# Patient Record
Sex: Female | Born: 1935
Health system: Southern US, Community
[De-identification: ages and names within clinical notes are randomized; demographics above are authoritative.]

## PROBLEM LIST (undated history)

## (undated) DIAGNOSIS — I10 Essential (primary) hypertension: Secondary | ICD-10-CM

## (undated) DIAGNOSIS — I071 Rheumatic tricuspid insufficiency: Secondary | ICD-10-CM

## (undated) DIAGNOSIS — I351 Nonrheumatic aortic (valve) insufficiency: Secondary | ICD-10-CM

## (undated) DIAGNOSIS — I77819 Aortic ectasia, unspecified site: Secondary | ICD-10-CM

## (undated) DIAGNOSIS — Z86718 Personal history of other venous thrombosis and embolism: Secondary | ICD-10-CM

## (undated) DIAGNOSIS — E785 Hyperlipidemia, unspecified: Secondary | ICD-10-CM

## (undated) DIAGNOSIS — J302 Other seasonal allergic rhinitis: Secondary | ICD-10-CM

## (undated) DIAGNOSIS — N183 Chronic kidney disease, stage 3 unspecified: Secondary | ICD-10-CM

## (undated) DIAGNOSIS — M1990 Unspecified osteoarthritis, unspecified site: Secondary | ICD-10-CM

## (undated) DIAGNOSIS — K219 Gastro-esophageal reflux disease without esophagitis: Secondary | ICD-10-CM

## (undated) DIAGNOSIS — I340 Nonrheumatic mitral (valve) insufficiency: Secondary | ICD-10-CM

## (undated) HISTORY — DX: Aortic ectasia, unspecified site: I77.819

## (undated) HISTORY — DX: Personal history of other venous thrombosis and embolism: Z86.718

## (undated) HISTORY — PX: CARPAL TUNNEL RELEASE: SHX101

## (undated) HISTORY — DX: Chronic kidney disease, stage 3 unspecified: N18.30

## (undated) HISTORY — DX: Essential (primary) hypertension: I10

## (undated) HISTORY — PX: APPENDECTOMY: SHX54

## (undated) HISTORY — DX: Nonrheumatic aortic (valve) insufficiency: I35.1

## (undated) HISTORY — PX: TONSILLECTOMY: SUR1361

## (undated) HISTORY — DX: Nonrheumatic mitral (valve) insufficiency: I34.0

## (undated) HISTORY — DX: Chronic kidney disease, stage 3 (moderate): N18.3

## (undated) HISTORY — DX: Hyperlipidemia, unspecified: E78.5

## (undated) HISTORY — DX: Rheumatic tricuspid insufficiency: I07.1

---

## 2000-10-11 ENCOUNTER — Other Ambulatory Visit: Admission: RE | Admit: 2000-10-11 | Discharge: 2000-10-11 | Payer: Self-pay | Admitting: Family Medicine

## 2000-10-12 ENCOUNTER — Ambulatory Visit (HOSPITAL_COMMUNITY): Admission: RE | Admit: 2000-10-12 | Discharge: 2000-10-12 | Payer: Self-pay | Admitting: Family Medicine

## 2000-10-12 ENCOUNTER — Encounter: Payer: Self-pay | Admitting: Family Medicine

## 2000-10-13 ENCOUNTER — Ambulatory Visit (HOSPITAL_COMMUNITY): Admission: RE | Admit: 2000-10-13 | Discharge: 2000-10-13 | Payer: Self-pay | Admitting: Family Medicine

## 2000-10-13 ENCOUNTER — Encounter: Payer: Self-pay | Admitting: Family Medicine

## 2003-10-09 ENCOUNTER — Ambulatory Visit (HOSPITAL_COMMUNITY): Admission: RE | Admit: 2003-10-09 | Discharge: 2003-10-09 | Payer: Self-pay | Admitting: Family Medicine

## 2003-10-29 ENCOUNTER — Ambulatory Visit (HOSPITAL_COMMUNITY): Admission: RE | Admit: 2003-10-29 | Discharge: 2003-10-29 | Payer: Self-pay | Admitting: Family Medicine

## 2004-02-18 ENCOUNTER — Ambulatory Visit (HOSPITAL_COMMUNITY): Admission: RE | Admit: 2004-02-18 | Discharge: 2004-02-18 | Payer: Self-pay | Admitting: Family Medicine

## 2004-07-29 ENCOUNTER — Ambulatory Visit (HOSPITAL_COMMUNITY): Admission: RE | Admit: 2004-07-29 | Discharge: 2004-07-29 | Payer: Self-pay | Admitting: Family Medicine

## 2005-08-12 ENCOUNTER — Ambulatory Visit (HOSPITAL_COMMUNITY): Admission: RE | Admit: 2005-08-12 | Discharge: 2005-08-12 | Payer: Self-pay | Admitting: Family Medicine

## 2005-10-27 ENCOUNTER — Ambulatory Visit (HOSPITAL_COMMUNITY): Admission: RE | Admit: 2005-10-27 | Discharge: 2005-10-27 | Payer: Self-pay | Admitting: Family Medicine

## 2005-12-29 ENCOUNTER — Ambulatory Visit (HOSPITAL_COMMUNITY): Admission: RE | Admit: 2005-12-29 | Discharge: 2005-12-29 | Payer: Self-pay | Admitting: Family Medicine

## 2005-12-29 ENCOUNTER — Encounter (HOSPITAL_COMMUNITY): Admission: RE | Admit: 2005-12-29 | Discharge: 2006-01-28 | Payer: Self-pay | Admitting: Orthopedic Surgery

## 2006-02-01 ENCOUNTER — Encounter: Admission: RE | Admit: 2006-02-01 | Discharge: 2006-03-03 | Payer: Self-pay | Admitting: Orthopedic Surgery

## 2006-03-04 ENCOUNTER — Ambulatory Visit: Admission: RE | Admit: 2006-03-04 | Discharge: 2006-03-04 | Payer: Self-pay | Admitting: Orthopedic Surgery

## 2006-05-03 ENCOUNTER — Ambulatory Visit (HOSPITAL_COMMUNITY): Admission: RE | Admit: 2006-05-03 | Discharge: 2006-05-03 | Payer: Self-pay | Admitting: Family Medicine

## 2006-05-18 ENCOUNTER — Encounter (HOSPITAL_COMMUNITY): Admission: RE | Admit: 2006-05-18 | Discharge: 2006-06-06 | Payer: Self-pay | Admitting: Family Medicine

## 2006-05-20 ENCOUNTER — Ambulatory Visit (HOSPITAL_COMMUNITY): Admission: RE | Admit: 2006-05-20 | Discharge: 2006-05-20 | Payer: Self-pay | Admitting: Family Medicine

## 2006-06-06 ENCOUNTER — Ambulatory Visit (HOSPITAL_COMMUNITY): Admission: RE | Admit: 2006-06-06 | Discharge: 2006-06-06 | Payer: Self-pay | Admitting: Family Medicine

## 2007-03-30 ENCOUNTER — Ambulatory Visit (HOSPITAL_COMMUNITY): Admission: RE | Admit: 2007-03-30 | Discharge: 2007-03-30 | Payer: Self-pay | Admitting: Family Medicine

## 2007-08-13 ENCOUNTER — Emergency Department (HOSPITAL_COMMUNITY): Admission: EM | Admit: 2007-08-13 | Discharge: 2007-08-13 | Payer: Self-pay | Admitting: Emergency Medicine

## 2007-08-14 ENCOUNTER — Ambulatory Visit (HOSPITAL_COMMUNITY): Admission: RE | Admit: 2007-08-14 | Discharge: 2007-08-14 | Payer: Self-pay | Admitting: Emergency Medicine

## 2007-08-14 ENCOUNTER — Emergency Department (HOSPITAL_COMMUNITY): Admission: EM | Admit: 2007-08-14 | Discharge: 2007-08-14 | Payer: Self-pay | Admitting: Emergency Medicine

## 2008-05-22 ENCOUNTER — Ambulatory Visit (HOSPITAL_COMMUNITY): Admission: RE | Admit: 2008-05-22 | Discharge: 2008-05-22 | Payer: Self-pay | Admitting: Family Medicine

## 2009-01-10 ENCOUNTER — Ambulatory Visit (HOSPITAL_COMMUNITY): Admission: RE | Admit: 2009-01-10 | Discharge: 2009-01-10 | Payer: Self-pay | Admitting: Family Medicine

## 2009-03-20 ENCOUNTER — Ambulatory Visit (HOSPITAL_COMMUNITY): Admission: RE | Admit: 2009-03-20 | Discharge: 2009-03-20 | Payer: Self-pay | Admitting: Family Medicine

## 2009-05-21 ENCOUNTER — Ambulatory Visit (HOSPITAL_COMMUNITY): Admission: RE | Admit: 2009-05-21 | Discharge: 2009-05-21 | Payer: Self-pay | Admitting: Family Medicine

## 2009-05-26 ENCOUNTER — Ambulatory Visit (HOSPITAL_COMMUNITY): Admission: RE | Admit: 2009-05-26 | Discharge: 2009-05-26 | Payer: Self-pay | Admitting: Family Medicine

## 2010-06-02 ENCOUNTER — Ambulatory Visit (HOSPITAL_COMMUNITY)
Admission: RE | Admit: 2010-06-02 | Discharge: 2010-06-02 | Payer: Self-pay | Source: Home / Self Care | Attending: Family Medicine | Admitting: Family Medicine

## 2010-09-10 ENCOUNTER — Ambulatory Visit (HOSPITAL_COMMUNITY)
Admission: RE | Admit: 2010-09-10 | Discharge: 2010-09-10 | Disposition: A | Discharge status: Home / Self Care | Payer: Medicare Other | Source: Ambulatory Visit | Attending: Family Medicine | Admitting: Family Medicine

## 2010-09-10 ENCOUNTER — Other Ambulatory Visit (HOSPITAL_COMMUNITY): Payer: Self-pay | Admitting: Family Medicine

## 2010-09-10 DIAGNOSIS — M25539 Pain in unspecified wrist: Secondary | ICD-10-CM

## 2010-09-10 DIAGNOSIS — S52539A Colles' fracture of unspecified radius, initial encounter for closed fracture: Secondary | ICD-10-CM | POA: Insufficient documentation

## 2010-09-10 DIAGNOSIS — M76899 Other specified enthesopathies of unspecified lower limb, excluding foot: Secondary | ICD-10-CM

## 2010-09-10 DIAGNOSIS — W19XXXA Unspecified fall, initial encounter: Secondary | ICD-10-CM | POA: Insufficient documentation

## 2010-09-10 DIAGNOSIS — R937 Abnormal findings on diagnostic imaging of other parts of musculoskeletal system: Secondary | ICD-10-CM | POA: Insufficient documentation

## 2010-09-10 DIAGNOSIS — M25569 Pain in unspecified knee: Secondary | ICD-10-CM | POA: Insufficient documentation

## 2010-10-26 ENCOUNTER — Ambulatory Visit (HOSPITAL_COMMUNITY)
Admission: RE | Admit: 2010-10-26 | Discharge: 2010-10-26 | Disposition: A | Discharge status: Home / Self Care | Payer: Medicare Other | Source: Ambulatory Visit | Attending: Orthopaedic Surgery | Admitting: Orthopaedic Surgery

## 2010-10-26 ENCOUNTER — Ambulatory Visit (HOSPITAL_COMMUNITY)
Admission: RE | Admit: 2010-10-26 | Discharge: 2010-10-26 | Disposition: A | Discharge status: Home / Self Care | Payer: Medicare Other | Source: Ambulatory Visit | Attending: Physical Therapy | Admitting: Physical Therapy

## 2010-10-26 DIAGNOSIS — M25439 Effusion, unspecified wrist: Secondary | ICD-10-CM | POA: Insufficient documentation

## 2010-10-26 DIAGNOSIS — M6281 Muscle weakness (generalized): Secondary | ICD-10-CM | POA: Insufficient documentation

## 2010-10-26 DIAGNOSIS — Z5189 Encounter for other specified aftercare: Secondary | ICD-10-CM | POA: Insufficient documentation

## 2010-10-26 DIAGNOSIS — I10 Essential (primary) hypertension: Secondary | ICD-10-CM | POA: Insufficient documentation

## 2010-10-26 DIAGNOSIS — M25539 Pain in unspecified wrist: Secondary | ICD-10-CM | POA: Insufficient documentation

## 2010-10-26 DIAGNOSIS — M25639 Stiffness of unspecified wrist, not elsewhere classified: Secondary | ICD-10-CM | POA: Insufficient documentation

## 2010-11-04 ENCOUNTER — Ambulatory Visit (HOSPITAL_COMMUNITY)
Admission: RE | Admit: 2010-11-04 | Discharge: 2010-11-04 | Disposition: A | Discharge status: Home / Self Care | Payer: Medicare Other | Source: Ambulatory Visit | Attending: Family Medicine | Admitting: Family Medicine

## 2011-05-28 ENCOUNTER — Other Ambulatory Visit (HOSPITAL_COMMUNITY): Payer: Self-pay | Admitting: Family Medicine

## 2011-05-28 DIAGNOSIS — Z139 Encounter for screening, unspecified: Secondary | ICD-10-CM

## 2011-05-28 DIAGNOSIS — Z01419 Encounter for gynecological examination (general) (routine) without abnormal findings: Secondary | ICD-10-CM

## 2011-06-02 ENCOUNTER — Ambulatory Visit (HOSPITAL_COMMUNITY)
Admission: RE | Admit: 2011-06-02 | Discharge: 2011-06-02 | Disposition: A | Discharge status: Home / Self Care | Payer: Medicare Other | Source: Ambulatory Visit | Attending: Family Medicine | Admitting: Family Medicine

## 2011-06-02 DIAGNOSIS — Z139 Encounter for screening, unspecified: Secondary | ICD-10-CM

## 2011-06-02 DIAGNOSIS — M949 Disorder of cartilage, unspecified: Secondary | ICD-10-CM | POA: Insufficient documentation

## 2011-06-02 DIAGNOSIS — Z780 Asymptomatic menopausal state: Secondary | ICD-10-CM | POA: Insufficient documentation

## 2011-06-02 DIAGNOSIS — Z01419 Encounter for gynecological examination (general) (routine) without abnormal findings: Secondary | ICD-10-CM

## 2011-06-02 DIAGNOSIS — M899 Disorder of bone, unspecified: Secondary | ICD-10-CM | POA: Insufficient documentation

## 2011-06-04 ENCOUNTER — Ambulatory Visit (HOSPITAL_COMMUNITY)
Admission: RE | Admit: 2011-06-04 | Discharge: 2011-06-04 | Disposition: A | Discharge status: Home / Self Care | Payer: Medicare Other | Source: Ambulatory Visit | Attending: Family Medicine | Admitting: Family Medicine

## 2011-06-04 DIAGNOSIS — Z1231 Encounter for screening mammogram for malignant neoplasm of breast: Secondary | ICD-10-CM | POA: Insufficient documentation

## 2011-06-04 DIAGNOSIS — Z139 Encounter for screening, unspecified: Secondary | ICD-10-CM

## 2012-05-11 ENCOUNTER — Other Ambulatory Visit (HOSPITAL_COMMUNITY): Payer: Self-pay | Admitting: Family Medicine

## 2012-05-11 DIAGNOSIS — Z139 Encounter for screening, unspecified: Secondary | ICD-10-CM

## 2012-06-05 ENCOUNTER — Ambulatory Visit (HOSPITAL_COMMUNITY)
Admission: RE | Admit: 2012-06-05 | Discharge: 2012-06-05 | Disposition: A | Discharge status: Home / Self Care | Payer: Medicare Other | Source: Ambulatory Visit | Attending: Family Medicine | Admitting: Family Medicine

## 2012-06-05 DIAGNOSIS — Z139 Encounter for screening, unspecified: Secondary | ICD-10-CM

## 2012-06-05 DIAGNOSIS — Z1231 Encounter for screening mammogram for malignant neoplasm of breast: Secondary | ICD-10-CM | POA: Insufficient documentation

## 2012-07-24 ENCOUNTER — Ambulatory Visit (HOSPITAL_COMMUNITY)
Admission: RE | Admit: 2012-07-24 | Discharge: 2012-07-24 | Disposition: A | Discharge status: Home / Self Care | Payer: Medicare Other | Source: Ambulatory Visit | Attending: Family Medicine | Admitting: Family Medicine

## 2012-07-24 ENCOUNTER — Other Ambulatory Visit (HOSPITAL_COMMUNITY): Payer: Self-pay | Admitting: Family Medicine

## 2012-07-24 DIAGNOSIS — I10 Essential (primary) hypertension: Secondary | ICD-10-CM | POA: Insufficient documentation

## 2012-07-24 DIAGNOSIS — J309 Allergic rhinitis, unspecified: Secondary | ICD-10-CM

## 2012-07-24 DIAGNOSIS — R059 Cough, unspecified: Secondary | ICD-10-CM | POA: Insufficient documentation

## 2012-07-24 DIAGNOSIS — R05 Cough: Secondary | ICD-10-CM | POA: Insufficient documentation

## 2012-07-24 DIAGNOSIS — J328 Other chronic sinusitis: Secondary | ICD-10-CM

## 2012-08-10 ENCOUNTER — Ambulatory Visit (INDEPENDENT_AMBULATORY_CARE_PROVIDER_SITE_OTHER): Payer: Medicare Other | Admitting: Otolaryngology

## 2012-09-14 ENCOUNTER — Ambulatory Visit (INDEPENDENT_AMBULATORY_CARE_PROVIDER_SITE_OTHER): Payer: Medicare Other | Admitting: Otolaryngology

## 2012-09-14 DIAGNOSIS — K219 Gastro-esophageal reflux disease without esophagitis: Secondary | ICD-10-CM

## 2012-09-14 DIAGNOSIS — R05 Cough: Secondary | ICD-10-CM

## 2012-09-14 DIAGNOSIS — R0982 Postnasal drip: Secondary | ICD-10-CM

## 2013-02-02 ENCOUNTER — Other Ambulatory Visit (HOSPITAL_COMMUNITY): Payer: Self-pay | Admitting: Nephrology

## 2013-02-07 ENCOUNTER — Ambulatory Visit (HOSPITAL_COMMUNITY)
Admission: RE | Admit: 2013-02-07 | Discharge: 2013-02-07 | Disposition: A | Discharge status: Home / Self Care | Payer: Medicare Other | Source: Ambulatory Visit | Attending: Nephrology | Admitting: Nephrology

## 2013-02-07 DIAGNOSIS — N189 Chronic kidney disease, unspecified: Secondary | ICD-10-CM | POA: Insufficient documentation

## 2013-06-04 ENCOUNTER — Other Ambulatory Visit (HOSPITAL_COMMUNITY): Payer: Self-pay | Admitting: Family Medicine

## 2013-06-04 DIAGNOSIS — Z139 Encounter for screening, unspecified: Secondary | ICD-10-CM

## 2013-06-05 ENCOUNTER — Ambulatory Visit (HOSPITAL_COMMUNITY)
Admission: RE | Admit: 2013-06-05 | Discharge: 2013-06-05 | Disposition: A | Discharge status: Home / Self Care | Payer: Medicare Other | Source: Ambulatory Visit | Attending: Family Medicine | Admitting: Family Medicine

## 2013-06-05 DIAGNOSIS — M810 Age-related osteoporosis without current pathological fracture: Secondary | ICD-10-CM | POA: Insufficient documentation

## 2013-06-05 DIAGNOSIS — Z139 Encounter for screening, unspecified: Secondary | ICD-10-CM

## 2013-06-11 ENCOUNTER — Ambulatory Visit (HOSPITAL_COMMUNITY)
Admission: RE | Admit: 2013-06-11 | Discharge: 2013-06-11 | Disposition: A | Discharge status: Home / Self Care | Payer: Medicare Other | Source: Ambulatory Visit | Attending: Family Medicine | Admitting: Family Medicine

## 2013-06-11 DIAGNOSIS — Z1231 Encounter for screening mammogram for malignant neoplasm of breast: Secondary | ICD-10-CM | POA: Insufficient documentation

## 2013-06-11 DIAGNOSIS — Z139 Encounter for screening, unspecified: Secondary | ICD-10-CM

## 2014-06-12 ENCOUNTER — Other Ambulatory Visit (HOSPITAL_COMMUNITY): Payer: Self-pay | Admitting: Family Medicine

## 2014-06-12 DIAGNOSIS — Z1231 Encounter for screening mammogram for malignant neoplasm of breast: Secondary | ICD-10-CM

## 2014-06-17 ENCOUNTER — Ambulatory Visit (HOSPITAL_COMMUNITY)
Admission: RE | Admit: 2014-06-17 | Discharge: 2014-06-17 | Disposition: A | Discharge status: Home / Self Care | Payer: Medicare Other | Source: Ambulatory Visit | Attending: Family Medicine | Admitting: Family Medicine

## 2014-06-17 DIAGNOSIS — Z1231 Encounter for screening mammogram for malignant neoplasm of breast: Secondary | ICD-10-CM | POA: Diagnosis present

## 2014-06-21 ENCOUNTER — Other Ambulatory Visit (HOSPITAL_COMMUNITY): Payer: Self-pay | Admitting: Family Medicine

## 2014-06-21 DIAGNOSIS — R011 Cardiac murmur, unspecified: Secondary | ICD-10-CM

## 2014-06-21 DIAGNOSIS — Z8249 Family history of ischemic heart disease and other diseases of the circulatory system: Secondary | ICD-10-CM

## 2014-06-24 ENCOUNTER — Other Ambulatory Visit (HOSPITAL_COMMUNITY): Payer: Self-pay | Admitting: Family Medicine

## 2014-06-24 ENCOUNTER — Ambulatory Visit (HOSPITAL_COMMUNITY)
Admission: RE | Admit: 2014-06-24 | Discharge: 2014-06-24 | Disposition: A | Discharge status: Home / Self Care | Payer: Medicare Other | Source: Ambulatory Visit | Attending: Family Medicine | Admitting: Family Medicine

## 2014-06-24 DIAGNOSIS — Z8249 Family history of ischemic heart disease and other diseases of the circulatory system: Secondary | ICD-10-CM

## 2014-06-24 DIAGNOSIS — R011 Cardiac murmur, unspecified: Secondary | ICD-10-CM

## 2014-06-24 DIAGNOSIS — Z1389 Encounter for screening for other disorder: Secondary | ICD-10-CM | POA: Insufficient documentation

## 2014-06-24 DIAGNOSIS — I77811 Abdominal aortic ectasia: Secondary | ICD-10-CM | POA: Diagnosis not present

## 2014-07-31 ENCOUNTER — Encounter: Payer: Self-pay | Admitting: *Deleted

## 2014-08-01 ENCOUNTER — Encounter: Payer: Self-pay | Admitting: Cardiology

## 2014-08-01 ENCOUNTER — Ambulatory Visit (INDEPENDENT_AMBULATORY_CARE_PROVIDER_SITE_OTHER): Payer: Medicare Other | Admitting: Cardiology

## 2014-08-01 VITALS — BP 168/90 | HR 69 | Ht 66.0 in | Wt 135.0 lb

## 2014-08-01 DIAGNOSIS — E782 Mixed hyperlipidemia: Secondary | ICD-10-CM

## 2014-08-01 DIAGNOSIS — R011 Cardiac murmur, unspecified: Secondary | ICD-10-CM

## 2014-08-01 DIAGNOSIS — I10 Essential (primary) hypertension: Secondary | ICD-10-CM | POA: Diagnosis not present

## 2014-08-01 DIAGNOSIS — I77811 Abdominal aortic ectasia: Secondary | ICD-10-CM

## 2014-08-01 NOTE — Patient Instructions (Signed)

## 2014-08-01 NOTE — Progress Notes (Signed)
Cardiology Office Note  Date: 08/01/2014   ID: Allison Morris, DOB 06-25-1935, MRN [id number]  PCP: Allison Morel, MD  Primary Cardiologist: Allison Lesches, MD   Chief Complaint  Patient presents with  . Heart Murmur    History of Present Illness: Allison Morris is a 79 y.o. female referred for cardiology consultation by Dr. Ethlyn Morris. She reports a long-standing history of cardiac murmur since childhood, no standing diagnosis of valvular heart disease however. She does not recall ever undergoing an echocardiogram. She has become more concerned about this as her sister just recently had to undergo what sounds like surgical repair of her aortic valve and a thoracic aortic aneurysm at [facility].  She describes herself as being fairly active with typical ADLs, gets mildly short of breath with activities such as vacuuming, but no progressive symptoms. No chest pain with exertion. She states that she has been told in the past that she has an "irregular heartbeat," but is not aware of any palpitations or diagnosis of cardiac arrhythmia.  She did have a recent abdominal ultrasound which reported ectasia of the abdominal aorta at 2.6 x 2.3 cm, no definite aneurysm however.   Past Medical History  Diagnosis Date  . Essential hypertension   . Hyperlipemia   . History of DVT (deep vein thrombosis)     Left leg 1997     Past Surgical History  Procedure Laterality Date  . Appendectomy    . Carpal tunnel release    . Tonsillectomy      Current Outpatient Prescriptions  Medication Sig Dispense Refill  . albuterol (PROVENTIL) 2 MG tablet Take 2 mg by mouth 2 (two) times daily.   0  . aspirin EC 325 MG tablet Take 325 mg by mouth daily.    Marland Kitchen CALCIUM-VITAMIN D PO Take 400 mg by mouth daily.    . clobetasol cream (TEMOVATE) 4.66 % Apply 1 application topically 2 (two) times daily.    . fexofenadine (ALLEGRA) 180 MG tablet Take 180 mg by mouth daily.    . fluticasone  (FLONASE) 50 MCG/ACT nasal spray Place into both nostrils daily.    Marland Kitchen KRILL OIL PO Take 600 mg by mouth daily.    . nebivolol (BYSTOLIC) 10 MG tablet Take 10 mg by mouth daily.    . pantoprazole (PROTONIX) 40 MG tablet Take 40 mg by mouth 2 (two) times daily.     No current facility-administered medications for this visit.    Allergies:  Biaxin; Doxycycline; Estrogens; Macrodantin; Statins; Ceftin; Ciprofloxacin; and Sulfa antibiotics   Social History: The patient  reports that she has never smoked. She has never used smokeless tobacco. She reports that she does not drink alcohol or use illicit drugs.   Family History: The patient's family history includes Aneurysm in her paternal grandfather and sister; Heart attack in her paternal grandfather; Valvular heart disease in her sister.   ROS:  Please see the history of present illness. Otherwise, complete review of systems is positive for none.  All other systems are reviewed and negative.    Physical Exam: VS:  BP 168/90 mmHg  Pulse 69  Ht 5\' 6"  (1.676 m)  Wt 135 lb (61.236 kg)  BMI 21.80 kg/m2  SpO2 98%, BMI Body mass index is 21.8 kg/(m^2).  Wt Readings from Last 3 Encounters:  08/01/14 135 lb (61.236 kg)     General: Patient appears comfortable at rest. HEENT: Conjunctiva and lids normal, oropharynx clear with moist mucosa.  Neck: Supple, no elevated JVP or carotid bruits, no thyromegaly. Lungs: Clear to auscultation, nonlabored breathing at rest. Cardiac: Regular rate and rhythm, no S3, soft right basal systolic murmur, no pericardial rub. Abdomen: Soft, nontender, bowel sounds present, no guarding or rebound. Extremities: No pitting edema, distal pulses 2+. Skin: Warm and dry. Musculoskeletal: No kyphosis. Neuropsychiatric: Alert and oriented x3, affect grossly appropriate.   ECG: ECG is not ordered today.   Recent Labwork:  Recent lab work from January showed BUN 33, creatinine 1.3, potassium 5.1, AST 18, ALT 10,  hemoglobin 15.3, platelets 2:15, cholesterol 255, triglycerides 129, HDL 62, LDL 167.  Other Studies Reviewed Today:  Abdominal ultrasound 06/24/2014: EXAM: ABDOMINAL AORTA SCREENING ULTRASOUND  TECHNIQUE: Ultrasound examination of the abdominal aorta was performed as a screening evaluation for abdominal aortic aneurysm.  COMPARISON: None.  FINDINGS: Abdominal Aorta  No aneurysm identified.  Maximum AP  Diameter: 2.6 cm  Maximum TRV  Diameter: 2.3 cm  IMPRESSION: 1. Ectatic abdominal aorta at risk for aneurysm development. Recommend follow up by [place] in 5 years. This recommendation follows ACR consensus guidelines: White Paper of the ACR Incidental Findings Committee II on Vascular Findings. J Am Coll Radiol 2013; 10:789-794.  Assessment and Plan:  1. Reportedly long-standing systolic murmur, soft on examination, seems unlikely to be pathologic at this point, and not clearly symptomatic. She has not had an echocardiogram for objective evaluation, and this will be obtained. This will also give [place] a review of her ascending thoracic aorta and arch.  2. Hyperlipidemia, recent LDL 167. She is currently not on specific medical therapy other than Krill oil.  3. Essential hypertension, presently on Bystolic. Blood pressure is elevated today. Keep follow-up with primary care as further medication adjustments may be necessary.  4. Ectatic abdominal aorta without definitive aneurysm as detailed above.  Current medicines are reviewed at length with the patient today.  The patient does not have concerns regarding medicines.    Orders Placed This Encounter  Procedures  . 2D Echocardiogram without contrast    Disposition: Call with results.   Signed, Allison Sark, MD, [facility] 08/01/2014 3:47 PM    [facility] [place], [place], [place] [address] Phone: [phone]; Fax: [phone]

## 2014-08-02 ENCOUNTER — Encounter: Payer: Self-pay | Admitting: Cardiology

## 2014-08-05 ENCOUNTER — Other Ambulatory Visit: Payer: Self-pay | Admitting: Radiology

## 2014-08-05 DIAGNOSIS — R011 Cardiac murmur, unspecified: Secondary | ICD-10-CM

## 2014-08-07 ENCOUNTER — Other Ambulatory Visit (INDEPENDENT_AMBULATORY_CARE_PROVIDER_SITE_OTHER): Payer: Medicare Other

## 2014-08-07 ENCOUNTER — Other Ambulatory Visit: Payer: Self-pay

## 2014-08-07 DIAGNOSIS — I77811 Abdominal aortic ectasia: Secondary | ICD-10-CM

## 2014-08-07 DIAGNOSIS — R011 Cardiac murmur, unspecified: Secondary | ICD-10-CM | POA: Diagnosis not present

## 2014-08-08 ENCOUNTER — Other Ambulatory Visit: Payer: Medicare Other

## 2014-08-09 ENCOUNTER — Telehealth: Payer: Self-pay | Admitting: *Deleted

## 2014-08-09 NOTE — Telephone Encounter (Signed)
Patient informed. 

## 2014-08-09 NOTE — Telephone Encounter (Signed)
-----   Message from Satira Sark, MD sent at 08/08/2014 11:25 AM EST ----- Reviewed report. LV systolic function is normal. The aortic valve is mildly thickened with mild aortic regurgitation, but no pathologic aortic stenosis. Ascending aorta is mildly dilated. This should likely be re-imaged in a year just to ensure stability over time. Not clear that it is symptomatic however. She should keep follow-up with her primary care provider, we can see her back in one year.

## 2014-09-10 ENCOUNTER — Emergency Department (HOSPITAL_COMMUNITY)
Admission: EM | Admit: 2014-09-10 | Discharge: 2014-09-10 | Disposition: A | Discharge status: Home / Self Care | Payer: Medicare Other | Attending: Emergency Medicine | Admitting: Emergency Medicine

## 2014-09-10 ENCOUNTER — Encounter (HOSPITAL_COMMUNITY): Payer: Self-pay | Admitting: *Deleted

## 2014-09-10 DIAGNOSIS — Z86718 Personal history of other venous thrombosis and embolism: Secondary | ICD-10-CM | POA: Insufficient documentation

## 2014-09-10 DIAGNOSIS — Z7982 Long term (current) use of aspirin: Secondary | ICD-10-CM | POA: Insufficient documentation

## 2014-09-10 DIAGNOSIS — I10 Essential (primary) hypertension: Secondary | ICD-10-CM | POA: Diagnosis present

## 2014-09-10 DIAGNOSIS — I160 Hypertensive urgency: Secondary | ICD-10-CM

## 2014-09-10 DIAGNOSIS — Z79899 Other long term (current) drug therapy: Secondary | ICD-10-CM | POA: Diagnosis not present

## 2014-09-10 DIAGNOSIS — Z8639 Personal history of other endocrine, nutritional and metabolic disease: Secondary | ICD-10-CM | POA: Diagnosis not present

## 2014-09-10 LAB — CBC WITH DIFFERENTIAL/PLATELET
Basophils Absolute: 0.1 10*3/uL (ref 0.0–0.1)
Basophils Relative: 1 % (ref 0–1)
EOS ABS: 0.1 10*3/uL (ref 0.0–0.7)
Eosinophils Relative: 2 % (ref 0–5)
HCT: 41.8 % (ref 36.0–46.0)
HEMOGLOBIN: 14.4 g/dL (ref 12.0–15.0)
LYMPHS PCT: 22 % (ref 12–46)
Lymphs Abs: 1.3 10*3/uL (ref 0.7–4.0)
MCH: 34.1 pg — AB (ref 26.0–34.0)
MCHC: 34.4 g/dL (ref 30.0–36.0)
MCV: 99.1 fL (ref 78.0–100.0)
MONOS PCT: 8 % (ref 3–12)
Monocytes Absolute: 0.5 10*3/uL (ref 0.1–1.0)
Neutro Abs: 4.1 10*3/uL (ref 1.7–7.7)
Neutrophils Relative %: 67 % (ref 43–77)
Platelets: 160 10*3/uL (ref 150–400)
RBC: 4.22 MIL/uL (ref 3.87–5.11)
RDW: 11.8 % (ref 11.5–15.5)
WBC: 6.1 10*3/uL (ref 4.0–10.5)

## 2014-09-10 LAB — COMPREHENSIVE METABOLIC PANEL
ALT: 12 U/L (ref 0–35)
AST: 15 U/L (ref 0–37)
Albumin: 4.1 g/dL (ref 3.5–5.2)
Alkaline Phosphatase: 80 U/L (ref 39–117)
Anion gap: 7 (ref 5–15)
BUN: 27 mg/dL — ABNORMAL HIGH (ref 6–23)
CO2: 23 mmol/L (ref 19–32)
Calcium: 10 mg/dL (ref 8.4–10.5)
Chloride: 109 mmol/L (ref 96–112)
Creatinine, Ser: 1.36 mg/dL — ABNORMAL HIGH (ref 0.50–1.10)
GFR calc Af Amer: 43 mL/min — ABNORMAL LOW (ref >90)
GFR, EST NON AFRICAN AMERICAN: 37 mL/min — AB (ref >90)
GLUCOSE: 104 mg/dL — AB (ref 70–99)
Potassium: 5.3 mmol/L — ABNORMAL HIGH (ref 3.5–5.1)
Sodium: 139 mmol/L (ref 135–145)
Total Bilirubin: 0.9 mg/dL (ref 0.3–1.2)
Total Protein: 7.1 g/dL (ref 6.0–8.3)

## 2014-09-10 LAB — TROPONIN I: Troponin I: <0.03 ng/mL (ref <0.031)

## 2014-09-10 MED ORDER — CLONIDINE HCL 0.2 MG PO TABS
0.2000 mg | ORAL_TABLET | Freq: Once | ORAL | Status: AC
  Administered 2014-09-10: 0.2 mg via ORAL
  Filled 2014-09-10: qty 1

## 2014-09-10 NOTE — Discharge Instructions (Signed)
Continue your antihypertensive medications as before.  Keep a record of your blood pressures so that you can take this to your primary Dr. at your next appointment and determine whether or not changes need to be made.   Hypertension Hypertension, commonly called high blood pressure, is when the force of blood pumping through your arteries is too strong. Your arteries are the blood vessels that carry blood from your heart throughout your body. A blood pressure reading consists of a higher number over a lower number, such as 110/72. The higher number (systolic) is the pressure inside your arteries when your heart pumps. The lower number (diastolic) is the pressure inside your arteries when your heart relaxes. Ideally you want your blood pressure below 120/80. Hypertension forces your heart to work harder to pump blood. Your arteries may become narrow or stiff. Having hypertension puts you at risk for heart disease, stroke, and other problems.  RISK FACTORS Some risk factors for high blood pressure are controllable. Others are not.  Risk factors you cannot control include:   Race. You may be at higher risk if you are African American.  Age. Risk increases with age.  Gender. Men are at higher risk than women before age 52 years. After age 26, women are at higher risk than men. Risk factors you can control include:  Not getting enough exercise or physical activity.  Being overweight.  Getting too much fat, sugar, calories, or salt in your diet.  Drinking too much alcohol. SIGNS AND SYMPTOMS Hypertension does not usually cause signs or symptoms. Extremely high blood pressure (hypertensive crisis) may cause headache, anxiety, shortness of breath, and nosebleed. DIAGNOSIS  To check if you have hypertension, your health care provider will measure your blood pressure while you are seated, with your arm held at the level of your heart. It should be measured at least twice using the same arm.  Certain conditions can cause a difference in blood pressure between your right and left arms. A blood pressure reading that is higher than normal on one occasion does not mean that you need treatment. If one blood pressure reading is high, ask your health care provider about having it checked again. TREATMENT  Treating high blood pressure includes making lifestyle changes and possibly taking medicine. Living a healthy lifestyle can help lower high blood pressure. You may need to change some of your habits. Lifestyle changes may include:  Following the DASH diet. This diet is high in fruits, vegetables, and whole grains. It is low in salt, red meat, and added sugars.  Getting at least 2 hours of brisk physical activity every week.  Losing weight if necessary.  Not smoking.  Limiting alcoholic beverages.  Learning ways to reduce stress. If lifestyle changes are not enough to get your blood pressure under control, your health care provider may prescribe medicine. You may need to take more than one. Work closely with your health care provider to understand the risks and benefits. HOME CARE INSTRUCTIONS  Have your blood pressure rechecked as directed by your health care provider.   Take medicines only as directed by your health care provider. Follow the directions carefully. Blood pressure medicines must be taken as prescribed. The medicine does not work as well when you skip doses. Skipping doses also puts you at risk for problems.   Do not smoke.   Monitor your blood pressure at home as directed by your health care provider. SEEK MEDICAL CARE IF:   You think you are having  a reaction to medicines taken.  You have recurrent headaches or feel dizzy.  You have swelling in your ankles.  You have trouble with your vision. SEEK IMMEDIATE MEDICAL CARE IF:  You develop a severe headache or confusion.  You have unusual weakness, numbness, or feel faint.  You have severe chest or  abdominal pain.  You vomit repeatedly.  You have trouble breathing. MAKE SURE YOU:   Understand these instructions.  Will watch your condition.  Will get help right away if you are not doing well or get worse. Document Released: 05/24/2005 Document Revised: 10/08/2013 Document Reviewed: 03/16/2013 [facility] Patient Information 2015 [place], [place]. This information is not intended to replace advice given to you by your health care provider. Make sure you discuss any questions you have with your health care provider.

## 2014-09-10 NOTE — ED Notes (Signed)
Pt reports seeing Dr Gerarda Fraction the other day and was given medications for HTN.  Pt states that she checked her BP tonight and it was 209/99.  Pt c/o rash on her face as well, and was treated with prednisone.

## 2014-09-10 NOTE — ED Provider Notes (Signed)
CSN: [id number]     Arrival date & time 09/10/14  6967 History   First MD Initiated Contact with Patient 09/10/14 [phone]     Chief Complaint  Patient presents with  . Hypertension     (Consider location/radiation/quality/duration/timing/severity/associated sxs/prior Treatment) HPI Comments: Patient is a 79 year old female with history of hypertension, high cholesterol, and DVT. She presents today for evaluation of elevated blood pressure. She recently saw her primary Dr. for a rash on her face. She was given a steroid injection and a referral was made to dermatology. She is awaiting this appointment. This evening she wasn't feeling well, then took her blood pressure was found to be 208. She presents for evaluation of this. She denies any headache or chest pain. She denies any shortness of breath.  Patient is a 79 y.o. female presenting with hypertension. The history is provided by the patient.  Hypertension This is a new problem. The current episode started yesterday. The problem occurs constantly. Pertinent negatives include no chest pain, no abdominal pain, no headaches and no shortness of breath. The symptoms are aggravated by intercourse. She has tried nothing for the symptoms. The treatment provided no relief.    Past Medical History  Diagnosis Date  . Essential hypertension   . Hyperlipemia   . History of DVT (deep vein thrombosis)     Left leg 1997    Past Surgical History  Procedure Laterality Date  . Appendectomy    . Carpal tunnel release    . Tonsillectomy     Family History  Problem Relation Age of Onset  . Aneurysm Sister   . Valvular heart disease Sister   . Aneurysm Paternal Grandfather   . Heart attack Paternal Grandfather    History  Substance Use Topics  . Smoking status: Never Smoker   . Smokeless tobacco: Never Used  . Alcohol Use: No   OB History    No data available     Review of Systems  Respiratory: Negative for shortness of breath.    Cardiovascular: Negative for chest pain.  Gastrointestinal: Negative for abdominal pain.  Neurological: Negative for headaches.  All other systems reviewed and are negative.     Allergies  Biaxin; Doxycycline; Estrogens; Macrodantin; Statins; Ceftin; Ciprofloxacin; and Sulfa antibiotics  Home Medications   Prior to Admission medications   Medication Sig Start Date End Date Taking? Authorizing Provider  losartan (COZAAR) 25 MG tablet Take 25 mg by mouth daily.   Yes Historical Provider, MD  albuterol (PROVENTIL) 2 MG tablet Take 2 mg by mouth 2 (two) times daily.  05/21/14   Historical Provider, MD  aspirin EC 325 MG tablet Take 325 mg by mouth daily.    Historical Provider, MD  CALCIUM-VITAMIN D PO Take 400 mg by mouth daily.    Historical Provider, MD  clobetasol cream (TEMOVATE) 1.01 % Apply 1 application topically 2 (two) times daily.    Historical Provider, MD  fexofenadine (ALLEGRA) 180 MG tablet Take 180 mg by mouth daily.    Historical Provider, MD  fluticasone (FLONASE) 50 MCG/ACT nasal spray Place into both nostrils daily.    Historical Provider, MD  KRILL OIL PO Take 600 mg by mouth daily.    Historical Provider, MD  nebivolol (BYSTOLIC) 10 MG tablet Take 10 mg by mouth daily.    Historical Provider, MD  pantoprazole (PROTONIX) 40 MG tablet Take 40 mg by mouth 2 (two) times daily.    Historical Provider, MD   BP 206/96 mmHg  Pulse  67  Temp(Src) 98.3 F (36.8 C) (Oral)  Resp 18  Ht 5\' 7"  (1.702 m)  Wt 130 lb (58.968 kg)  BMI 20.36 kg/m2  SpO2 99% Physical Exam  Constitutional: She is oriented to person, place, and time. She appears well-developed and well-nourished. No distress.  HENT:  Head: Normocephalic and atraumatic.  Mouth/Throat: Oropharynx is clear and moist.  Eyes: EOM are normal. Pupils are equal, round, and reactive to light.  Neck: Normal range of motion. Neck supple.  Cardiovascular: Normal rate and regular rhythm.  Exam reveals no gallop and no  friction rub.   No murmur heard. Pulmonary/Chest: Effort normal and breath sounds normal. No respiratory distress. She has no wheezes.  Abdominal: Soft. Bowel sounds are normal. She exhibits no distension. There is no tenderness.  Musculoskeletal: Normal range of motion. She exhibits no edema.  Neurological: She is alert and oriented to person, place, and time. No cranial nerve deficit. She exhibits normal muscle tone. Coordination normal.  Skin: Skin is warm and dry. She is not diaphoretic.  Nursing note and vitals reviewed.   ED Course  Procedures (including critical care time) Labs Review Labs Reviewed  COMPREHENSIVE METABOLIC PANEL  CBC WITH DIFFERENTIAL/PLATELET  TROPONIN I    Imaging Review No results found.   EKG Interpretation   Date/Time:  Tuesday September 10 2014 04:21:04 EDT Ventricular Rate:  60 PR Interval:  194 QRS Duration: 99 QT Interval:  458 QTC Calculation: 458 R Axis:   68 Text Interpretation:  Sinus rhythm Confirmed by DELOS  MD, Zyquan Crotty ([id number])  on 09/10/2014 5:07:21 AM      MDM   Final diagnoses:  None    Patient presents here with complaints of elevated blood pressure and not feeling well. Her workup reveals no evidence for endorgan damage in her blood pressure has improved with clonidine. She will be discharged to home. I will advise her to continue her anti-hypertensive medications as before and keep a record of her pressures. She can present this to her primary doctor the next time she follows up to determine if changes need to be made. Her neurologic exam is nonfocal and I do not feel as though any further workup is indicated at this point.    Veryl Speak, MD 09/10/14 [phone]

## 2015-06-11 ENCOUNTER — Other Ambulatory Visit: Payer: Self-pay | Admitting: Cardiology

## 2015-06-11 DIAGNOSIS — R011 Cardiac murmur, unspecified: Secondary | ICD-10-CM

## 2015-06-12 DIAGNOSIS — H6690 Otitis media, unspecified, unspecified ear: Secondary | ICD-10-CM | POA: Diagnosis not present

## 2015-06-12 DIAGNOSIS — Z1389 Encounter for screening for other disorder: Secondary | ICD-10-CM | POA: Diagnosis not present

## 2015-06-12 DIAGNOSIS — Z6823 Body mass index (BMI) 23.0-23.9, adult: Secondary | ICD-10-CM | POA: Diagnosis not present

## 2015-06-12 DIAGNOSIS — H6990 Unspecified Eustachian tube disorder, unspecified ear: Secondary | ICD-10-CM | POA: Diagnosis not present

## 2015-06-17 DIAGNOSIS — Z1389 Encounter for screening for other disorder: Secondary | ICD-10-CM | POA: Diagnosis not present

## 2015-06-17 DIAGNOSIS — I10 Essential (primary) hypertension: Secondary | ICD-10-CM | POA: Diagnosis not present

## 2015-06-17 DIAGNOSIS — Z0000 Encounter for general adult medical examination without abnormal findings: Secondary | ICD-10-CM | POA: Diagnosis not present

## 2015-06-17 DIAGNOSIS — N183 Chronic kidney disease, stage 3 (moderate): Secondary | ICD-10-CM | POA: Diagnosis not present

## 2015-06-17 DIAGNOSIS — Z6823 Body mass index (BMI) 23.0-23.9, adult: Secondary | ICD-10-CM | POA: Diagnosis not present

## 2015-06-18 ENCOUNTER — Other Ambulatory Visit (HOSPITAL_COMMUNITY): Payer: Self-pay | Admitting: Internal Medicine

## 2015-06-18 DIAGNOSIS — Z1231 Encounter for screening mammogram for malignant neoplasm of breast: Secondary | ICD-10-CM

## 2015-06-18 DIAGNOSIS — Z13820 Encounter for screening for osteoporosis: Secondary | ICD-10-CM

## 2015-06-23 ENCOUNTER — Ambulatory Visit (HOSPITAL_COMMUNITY)
Admission: RE | Admit: 2015-06-23 | Discharge: 2015-06-23 | Disposition: A | Discharge status: Home / Self Care | Payer: PPO | Source: Ambulatory Visit | Attending: Internal Medicine | Admitting: Internal Medicine

## 2015-06-23 DIAGNOSIS — Z1231 Encounter for screening mammogram for malignant neoplasm of breast: Secondary | ICD-10-CM | POA: Insufficient documentation

## 2015-06-23 DIAGNOSIS — Z13820 Encounter for screening for osteoporosis: Secondary | ICD-10-CM | POA: Diagnosis not present

## 2015-06-23 DIAGNOSIS — Z780 Asymptomatic menopausal state: Secondary | ICD-10-CM | POA: Insufficient documentation

## 2015-06-23 DIAGNOSIS — R928 Other abnormal and inconclusive findings on diagnostic imaging of breast: Secondary | ICD-10-CM | POA: Diagnosis not present

## 2015-06-23 DIAGNOSIS — M810 Age-related osteoporosis without current pathological fracture: Secondary | ICD-10-CM | POA: Insufficient documentation

## 2015-06-24 ENCOUNTER — Other Ambulatory Visit: Payer: Self-pay | Admitting: Internal Medicine

## 2015-06-24 DIAGNOSIS — R928 Other abnormal and inconclusive findings on diagnostic imaging of breast: Secondary | ICD-10-CM

## 2015-06-27 DIAGNOSIS — H04123 Dry eye syndrome of bilateral lacrimal glands: Secondary | ICD-10-CM | POA: Diagnosis not present

## 2015-06-27 DIAGNOSIS — H10413 Chronic giant papillary conjunctivitis, bilateral: Secondary | ICD-10-CM | POA: Diagnosis not present

## 2015-06-27 DIAGNOSIS — Z961 Presence of intraocular lens: Secondary | ICD-10-CM | POA: Diagnosis not present

## 2015-06-27 DIAGNOSIS — H43813 Vitreous degeneration, bilateral: Secondary | ICD-10-CM | POA: Diagnosis not present

## 2015-06-27 DIAGNOSIS — H11823 Conjunctivochalasis, bilateral: Secondary | ICD-10-CM | POA: Diagnosis not present

## 2015-06-27 DIAGNOSIS — H1851 Endothelial corneal dystrophy: Secondary | ICD-10-CM | POA: Diagnosis not present

## 2015-06-27 DIAGNOSIS — H02403 Unspecified ptosis of bilateral eyelids: Secondary | ICD-10-CM | POA: Diagnosis not present

## 2015-07-07 ENCOUNTER — Other Ambulatory Visit: Payer: Self-pay | Admitting: Internal Medicine

## 2015-07-07 DIAGNOSIS — R928 Other abnormal and inconclusive findings on diagnostic imaging of breast: Secondary | ICD-10-CM

## 2015-07-08 ENCOUNTER — Ambulatory Visit (HOSPITAL_COMMUNITY)
Admission: RE | Admit: 2015-07-08 | Discharge: 2015-07-08 | Disposition: A | Discharge status: Home / Self Care | Payer: PPO | Source: Ambulatory Visit | Attending: Internal Medicine | Admitting: Internal Medicine

## 2015-07-08 DIAGNOSIS — R928 Other abnormal and inconclusive findings on diagnostic imaging of breast: Secondary | ICD-10-CM | POA: Diagnosis not present

## 2015-07-08 DIAGNOSIS — N63 Unspecified lump in breast: Secondary | ICD-10-CM | POA: Diagnosis not present

## 2015-07-11 DIAGNOSIS — R5383 Other fatigue: Secondary | ICD-10-CM | POA: Diagnosis not present

## 2015-07-11 DIAGNOSIS — M810 Age-related osteoporosis without current pathological fracture: Secondary | ICD-10-CM | POA: Diagnosis not present

## 2015-07-11 DIAGNOSIS — E559 Vitamin D deficiency, unspecified: Secondary | ICD-10-CM | POA: Diagnosis not present

## 2015-07-18 DIAGNOSIS — M1A0710 Idiopathic chronic gout, right ankle and foot, without tophus (tophi): Secondary | ICD-10-CM | POA: Diagnosis not present

## 2015-07-25 DIAGNOSIS — Z6823 Body mass index (BMI) 23.0-23.9, adult: Secondary | ICD-10-CM | POA: Diagnosis not present

## 2015-07-25 DIAGNOSIS — J029 Acute pharyngitis, unspecified: Secondary | ICD-10-CM | POA: Diagnosis not present

## 2015-07-25 DIAGNOSIS — Z1389 Encounter for screening for other disorder: Secondary | ICD-10-CM | POA: Diagnosis not present

## 2015-08-08 DIAGNOSIS — J029 Acute pharyngitis, unspecified: Secondary | ICD-10-CM | POA: Diagnosis not present

## 2015-08-08 DIAGNOSIS — E785 Hyperlipidemia, unspecified: Secondary | ICD-10-CM | POA: Diagnosis not present

## 2015-08-14 ENCOUNTER — Ambulatory Visit (INDEPENDENT_AMBULATORY_CARE_PROVIDER_SITE_OTHER): Payer: PPO

## 2015-08-14 ENCOUNTER — Other Ambulatory Visit: Payer: Self-pay

## 2015-08-14 DIAGNOSIS — R011 Cardiac murmur, unspecified: Secondary | ICD-10-CM | POA: Diagnosis not present

## 2015-08-14 LAB — ECHOCARDIOGRAM COMPLETE
CHL CUP STROKE VOLUME: 80 mL
E decel time: 161 msec
FS: 44 % (ref 28–44)
IVS/LV PW RATIO, ED: 0.83
LEFT ATRIUM END SYS DIAM: 35 cm
LV SIMPSON'S DISK: 67
LVOT area: 3.14 cm2
LVOTPV: 105 m/s
MV pk A vel: 113 m/s
MVPG: 4 mmHg
MVPKEVEL: 97.5 m/s
PW: 13.8 mm — AB (ref 0.6–1.1)
VTI: 25.5 cm

## 2015-08-15 ENCOUNTER — Telehealth: Payer: Self-pay | Admitting: *Deleted

## 2015-08-15 NOTE — Telephone Encounter (Signed)
-----   Message from Satira Sark, MD sent at 08/14/2015  3:41 PM EST ----- Study reviewed. Overall stable findings. Mild aortic regurgitation as before and mildly dilated ascending aorta.

## 2015-08-18 NOTE — Telephone Encounter (Signed)
Patient informed. 

## 2015-08-22 ENCOUNTER — Encounter: Payer: Self-pay | Admitting: Cardiology

## 2015-08-22 ENCOUNTER — Ambulatory Visit (INDEPENDENT_AMBULATORY_CARE_PROVIDER_SITE_OTHER): Payer: PPO | Admitting: Cardiology

## 2015-08-22 VITALS — BP 160/92 | HR 58 | Ht 67.0 in | Wt 132.0 lb

## 2015-08-22 DIAGNOSIS — Z136 Encounter for screening for cardiovascular disorders: Secondary | ICD-10-CM | POA: Diagnosis not present

## 2015-08-22 DIAGNOSIS — I10 Essential (primary) hypertension: Secondary | ICD-10-CM

## 2015-08-22 DIAGNOSIS — I77819 Aortic ectasia, unspecified site: Secondary | ICD-10-CM

## 2015-08-22 DIAGNOSIS — I351 Nonrheumatic aortic (valve) insufficiency: Secondary | ICD-10-CM

## 2015-08-22 NOTE — Progress Notes (Signed)
Cardiology Office Note  Date: 08/22/2015   ID: Allison Morris, DOB 1936/03/08, MRN [id number]  PCP: Rocky Morel, MD  Primary Cardiologist: Rozann Lesches, MD   Chief Complaint  Patient presents with  . Cardiac follow-up    History of Present Illness: Allison Morris is a 80 y.o. female last seen in February 2016. She presents for a routine follow-up visit. No reported exertional chest pain or palpitations. She had a routine physical at [facility] back in January.  Repeat echocardiogram is reviewed below. She has a mildly dilated ascending aorta with mildly sclerotic aortic valve and mild aortic regurgitation. We went over the results today. She is asymptomatic at this time. Plan is to continue observation.  Blood pressure is elevated today, she states that usually it is much better controlled. She is tolerating Bystolic. She limits sodium in her diet.  ECG today shows sinus bradycardia with borderline low voltage.  Past Medical History  Diagnosis Date  . Essential hypertension   . Hyperlipemia   . History of DVT (deep vein thrombosis)     Left leg 1997   . Aortic dilatation (HCC)   . Aortic regurgitation     Current Outpatient Prescriptions  Medication Sig Dispense Refill  . acetaminophen (TYLENOL) 500 MG tablet Take 500 mg by mouth every 6 (six) hours as needed.    Marland Kitchen aspirin EC 325 MG tablet Take 325 mg by mouth daily.    Marland Kitchen CALCIUM-VITAMIN D PO Take 400 mg by mouth daily.    . Cholecalciferol (VITAMIN D3) 1000 units CAPS Take by mouth daily.    . clobetasol cream (TEMOVATE) [id number] % Apply 1 application topically 2 (two) times daily.    . fexofenadine (ALLEGRA) 180 MG tablet Take 180 mg by mouth daily.    . fluticasone (FLONASE) 50 MCG/ACT nasal spray Place into both nostrils daily.    . Glucosamine-Chondroit-Vit C-Mn (GLUCOSAMINE 1500 COMPLEX PO) Take by mouth daily.    . nebivolol (BYSTOLIC) 5 MG tablet Take 5 mg by mouth daily.    . Omega-3 Fatty Acids (FISH  OIL) 1200 MG CAPS Take by mouth 2 (two) times daily.    . pantoprazole (PROTONIX) 40 MG tablet Take 40 mg by mouth daily.      No current facility-administered medications for this visit.   Allergies:  Biaxin; Doxycycline; Estrogens; Macrodantin; Statins; Ceftin; Ciprofloxacin; and Sulfa antibiotics   Social History: The patient  reports that she has never smoked. She has never used smokeless tobacco. She reports that she does not drink alcohol or use illicit drugs.   ROS:  Please see the history of present illness. Otherwise, complete review of systems is positive for none.  All other systems are reviewed and negative.   Physical Exam: VS:  BP 160/92 mmHg  Pulse 58  Ht 5\' 7"  (1.702 m)  Wt 132 lb (59.875 kg)  BMI 20.67 kg/m2, BMI Body mass index is 20.67 kg/(m^2).  Wt Readings from Last 3 Encounters:  08/22/15 132 lb (59.875 kg)  09/10/14 130 lb (58.968 kg)  08/01/14 135 lb (61.236 kg)    General: Patient appears comfortable at rest. HEENT: Conjunctiva and lids normal, oropharynx clear with moist mucosa. Neck: Supple, no elevated JVP or carotid bruits, no thyromegaly. Lungs: Clear to auscultation, nonlabored breathing at rest. Cardiac: Regular rate and rhythm, no S3, soft right basal systolic murmur, no diastolic murmur, no pericardial rub. Abdomen: Soft, nontender, bowel sounds present, no guarding or rebound. Extremities: No pitting edema, distal  pulses 2+.  ECG: I personally reviewed the prior tracing from 09/10/2014 which showed normal sinus rhythm..  Recent Labwork: 09/10/2014: ALT 12; AST 15; BUN 27*; Creatinine, Ser 1.36*; Hemoglobin 14.4; Platelets 160; Potassium 5.3*; Sodium 139   Other Studies Reviewed Today:  Echocardiogram 08/14/2015: Study Conclusions  - Left ventricle: The cavity size was normal. Wall thickness was  increased in a pattern of mild LVH. Systolic function was  vigorous. The estimated ejection fraction was in the range of 65%  to 70%. Wall motion  was normal; there were no regional wall  motion abnormalities. Doppler parameters are consistent with  abnormal left ventricular relaxation (grade 1 diastolic  dysfunction). - Aortic valve: Trileaflet; mildly calcified leaflets. There was  mild regurgitation. - Ascending aorta: The ascending aorta was mildly dilated. - Mitral valve: Calcified annulus. - Right atrium: Central venous pressure (est): 3 mm Hg. - Atrial septum: No defect or patent foramen ovale was identified. - Tricuspid valve: There was mild regurgitation. - Pulmonary arteries: PA peak pressure: 23 mm Hg (S). - Pericardium, extracardiac: There was no pericardial effusion.  Impressions:  - Mild LVH with LVEF 65-70%. Grade 1 diastolic dysfunction with  equivocal LV filling pressure. Mitral annular calcification.  Mildly dilated ascending aorta. Mildly sclerotic aortic valve  with mild aortic regurgitation. Mild tricuspid regurgitation with  PASP 23 mmHg.  Assessment and Plan:  1. Mildly dilated ascending aorta with mild aortic regurgitation. Aorta valve is mildly sclerotic but not stenotic. We will continue observation for now.  2. Essential hypertension. Continue Bystolic. If blood pressure trend up, would consider adding ARB.  Current medicines were reviewed with the patient today.   Orders Placed This Encounter  Procedures  . EKG 12-Lead    Disposition: FU with me in 1 year.   Signed, Satira Sark, MD, [facility] 08/22/2015 11:05 AM    [facility] [place], [place], [place] [address] Phone: [phone]; Fax: [phone]

## 2015-08-22 NOTE — Patient Instructions (Signed)

## 2015-09-05 DIAGNOSIS — Z6823 Body mass index (BMI) 23.0-23.9, adult: Secondary | ICD-10-CM | POA: Diagnosis not present

## 2015-09-05 DIAGNOSIS — Z1389 Encounter for screening for other disorder: Secondary | ICD-10-CM | POA: Diagnosis not present

## 2015-09-05 DIAGNOSIS — M25572 Pain in left ankle and joints of left foot: Secondary | ICD-10-CM | POA: Diagnosis not present

## 2015-09-12 DIAGNOSIS — I10 Essential (primary) hypertension: Secondary | ICD-10-CM | POA: Diagnosis not present

## 2015-09-12 DIAGNOSIS — J209 Acute bronchitis, unspecified: Secondary | ICD-10-CM | POA: Diagnosis not present

## 2015-09-12 DIAGNOSIS — Z1389 Encounter for screening for other disorder: Secondary | ICD-10-CM | POA: Diagnosis not present

## 2015-09-12 DIAGNOSIS — Z6823 Body mass index (BMI) 23.0-23.9, adult: Secondary | ICD-10-CM | POA: Diagnosis not present

## 2015-09-12 DIAGNOSIS — N183 Chronic kidney disease, stage 3 (moderate): Secondary | ICD-10-CM | POA: Diagnosis not present

## 2015-09-12 DIAGNOSIS — J019 Acute sinusitis, unspecified: Secondary | ICD-10-CM | POA: Diagnosis not present

## 2015-10-09 ENCOUNTER — Ambulatory Visit (HOSPITAL_COMMUNITY)
Admission: RE | Admit: 2015-10-09 | Discharge: 2015-10-09 | Disposition: A | Discharge status: Home / Self Care | Payer: PPO | Source: Ambulatory Visit | Attending: Internal Medicine | Admitting: Internal Medicine

## 2015-10-09 ENCOUNTER — Other Ambulatory Visit (HOSPITAL_COMMUNITY): Payer: Self-pay | Admitting: Internal Medicine

## 2015-10-09 DIAGNOSIS — R059 Cough, unspecified: Secondary | ICD-10-CM

## 2015-10-09 DIAGNOSIS — N183 Chronic kidney disease, stage 3 (moderate): Secondary | ICD-10-CM | POA: Diagnosis not present

## 2015-10-09 DIAGNOSIS — J9811 Atelectasis: Secondary | ICD-10-CM | POA: Diagnosis not present

## 2015-10-09 DIAGNOSIS — J189 Pneumonia, unspecified organism: Secondary | ICD-10-CM | POA: Diagnosis not present

## 2015-10-09 DIAGNOSIS — I10 Essential (primary) hypertension: Secondary | ICD-10-CM | POA: Diagnosis not present

## 2015-10-09 DIAGNOSIS — I517 Cardiomegaly: Secondary | ICD-10-CM | POA: Insufficient documentation

## 2015-10-09 DIAGNOSIS — Z6823 Body mass index (BMI) 23.0-23.9, adult: Secondary | ICD-10-CM | POA: Diagnosis not present

## 2015-10-09 DIAGNOSIS — E782 Mixed hyperlipidemia: Secondary | ICD-10-CM | POA: Diagnosis not present

## 2015-10-09 DIAGNOSIS — R05 Cough: Secondary | ICD-10-CM | POA: Diagnosis not present

## 2015-10-09 DIAGNOSIS — Z1389 Encounter for screening for other disorder: Secondary | ICD-10-CM | POA: Diagnosis not present

## 2015-10-15 DIAGNOSIS — N183 Chronic kidney disease, stage 3 (moderate): Secondary | ICD-10-CM | POA: Diagnosis not present

## 2015-10-15 DIAGNOSIS — N2581 Secondary hyperparathyroidism of renal origin: Secondary | ICD-10-CM | POA: Diagnosis not present

## 2015-10-15 DIAGNOSIS — N189 Chronic kidney disease, unspecified: Secondary | ICD-10-CM | POA: Diagnosis not present

## 2015-10-22 DIAGNOSIS — I129 Hypertensive chronic kidney disease with stage 1 through stage 4 chronic kidney disease, or unspecified chronic kidney disease: Secondary | ICD-10-CM | POA: Diagnosis not present

## 2015-10-22 DIAGNOSIS — N2581 Secondary hyperparathyroidism of renal origin: Secondary | ICD-10-CM | POA: Diagnosis not present

## 2015-10-22 DIAGNOSIS — D631 Anemia in chronic kidney disease: Secondary | ICD-10-CM | POA: Diagnosis not present

## 2015-10-22 DIAGNOSIS — N183 Chronic kidney disease, stage 3 (moderate): Secondary | ICD-10-CM | POA: Diagnosis not present

## 2015-10-27 DIAGNOSIS — E559 Vitamin D deficiency, unspecified: Secondary | ICD-10-CM | POA: Diagnosis not present

## 2015-10-27 DIAGNOSIS — M109 Gout, unspecified: Secondary | ICD-10-CM | POA: Diagnosis not present

## 2015-10-27 DIAGNOSIS — M8000XD Age-related osteoporosis with current pathological fracture, unspecified site, subsequent encounter for fracture with routine healing: Secondary | ICD-10-CM | POA: Diagnosis not present

## 2015-11-06 DIAGNOSIS — R1033 Periumbilical pain: Secondary | ICD-10-CM | POA: Diagnosis not present

## 2015-11-06 DIAGNOSIS — M1000 Idiopathic gout, unspecified site: Secondary | ICD-10-CM | POA: Diagnosis not present

## 2015-11-06 DIAGNOSIS — Z6823 Body mass index (BMI) 23.0-23.9, adult: Secondary | ICD-10-CM | POA: Diagnosis not present

## 2015-11-06 DIAGNOSIS — Z1389 Encounter for screening for other disorder: Secondary | ICD-10-CM | POA: Diagnosis not present

## 2015-11-11 DIAGNOSIS — Z1389 Encounter for screening for other disorder: Secondary | ICD-10-CM | POA: Diagnosis not present

## 2015-11-11 DIAGNOSIS — I10 Essential (primary) hypertension: Secondary | ICD-10-CM | POA: Diagnosis not present

## 2015-11-11 DIAGNOSIS — M810 Age-related osteoporosis without current pathological fracture: Secondary | ICD-10-CM | POA: Diagnosis not present

## 2015-11-11 DIAGNOSIS — Z6823 Body mass index (BMI) 23.0-23.9, adult: Secondary | ICD-10-CM | POA: Diagnosis not present

## 2015-11-11 DIAGNOSIS — R109 Unspecified abdominal pain: Secondary | ICD-10-CM | POA: Diagnosis not present

## 2015-11-11 DIAGNOSIS — E063 Autoimmune thyroiditis: Secondary | ICD-10-CM | POA: Diagnosis not present

## 2015-11-13 DIAGNOSIS — R1033 Periumbilical pain: Secondary | ICD-10-CM | POA: Diagnosis not present

## 2015-11-13 DIAGNOSIS — I10 Essential (primary) hypertension: Secondary | ICD-10-CM | POA: Diagnosis not present

## 2015-11-13 DIAGNOSIS — E559 Vitamin D deficiency, unspecified: Secondary | ICD-10-CM | POA: Diagnosis not present

## 2015-11-13 DIAGNOSIS — R5383 Other fatigue: Secondary | ICD-10-CM | POA: Diagnosis not present

## 2015-11-13 DIAGNOSIS — M810 Age-related osteoporosis without current pathological fracture: Secondary | ICD-10-CM | POA: Diagnosis not present

## 2015-11-13 DIAGNOSIS — Z6823 Body mass index (BMI) 23.0-23.9, adult: Secondary | ICD-10-CM | POA: Diagnosis not present

## 2015-11-14 ENCOUNTER — Encounter (HOSPITAL_COMMUNITY): Payer: Self-pay

## 2015-11-14 ENCOUNTER — Emergency Department (HOSPITAL_COMMUNITY): Payer: PPO

## 2015-11-14 ENCOUNTER — Observation Stay (HOSPITAL_COMMUNITY): Payer: PPO

## 2015-11-14 ENCOUNTER — Observation Stay (HOSPITAL_COMMUNITY)
Admission: EM | Admit: 2015-11-14 | Discharge: 2015-11-15 | Disposition: A | Discharge status: Home / Self Care | Payer: PPO | Attending: Internal Medicine | Admitting: Internal Medicine

## 2015-11-14 DIAGNOSIS — Z7982 Long term (current) use of aspirin: Secondary | ICD-10-CM | POA: Diagnosis not present

## 2015-11-14 DIAGNOSIS — G459 Transient cerebral ischemic attack, unspecified: Secondary | ICD-10-CM | POA: Diagnosis not present

## 2015-11-14 DIAGNOSIS — I10 Essential (primary) hypertension: Secondary | ICD-10-CM | POA: Diagnosis not present

## 2015-11-14 DIAGNOSIS — E875 Hyperkalemia: Secondary | ICD-10-CM | POA: Diagnosis not present

## 2015-11-14 DIAGNOSIS — E785 Hyperlipidemia, unspecified: Secondary | ICD-10-CM | POA: Insufficient documentation

## 2015-11-14 DIAGNOSIS — Z86718 Personal history of other venous thrombosis and embolism: Secondary | ICD-10-CM | POA: Insufficient documentation

## 2015-11-14 DIAGNOSIS — Z79899 Other long term (current) drug therapy: Secondary | ICD-10-CM | POA: Insufficient documentation

## 2015-11-14 DIAGNOSIS — Z8679 Personal history of other diseases of the circulatory system: Secondary | ICD-10-CM | POA: Insufficient documentation

## 2015-11-14 DIAGNOSIS — I77819 Aortic ectasia, unspecified site: Secondary | ICD-10-CM | POA: Insufficient documentation

## 2015-11-14 DIAGNOSIS — R42 Dizziness and giddiness: Secondary | ICD-10-CM | POA: Diagnosis not present

## 2015-11-14 DIAGNOSIS — H538 Other visual disturbances: Secondary | ICD-10-CM | POA: Diagnosis not present

## 2015-11-14 LAB — COMPREHENSIVE METABOLIC PANEL
ALBUMIN: 3.8 g/dL (ref 3.5–5.0)
ALT: 13 U/L — AB (ref 14–54)
AST: 16 U/L (ref 15–41)
Alkaline Phosphatase: 59 U/L (ref 38–126)
Anion gap: 6 (ref 5–15)
BUN: 22 mg/dL — ABNORMAL HIGH (ref 6–20)
CHLORIDE: 103 mmol/L (ref 101–111)
CO2: 23 mmol/L (ref 22–32)
CREATININE: 1.18 mg/dL — AB (ref 0.44–1.00)
Calcium: 8.6 mg/dL — ABNORMAL LOW (ref 8.9–10.3)
GFR calc non Af Amer: 43 mL/min — ABNORMAL LOW (ref >60)
GFR, EST AFRICAN AMERICAN: 50 mL/min — AB (ref >60)
Glucose, Bld: 103 mg/dL — ABNORMAL HIGH (ref 65–99)
Potassium: 4.1 mmol/L (ref 3.5–5.1)
SODIUM: 132 mmol/L — AB (ref 135–145)
Total Bilirubin: 1 mg/dL (ref 0.3–1.2)
Total Protein: 6.7 g/dL (ref 6.5–8.1)

## 2015-11-14 LAB — APTT: APTT: 25 s (ref 24–37)

## 2015-11-14 LAB — URINALYSIS, ROUTINE W REFLEX MICROSCOPIC
BILIRUBIN URINE: NEGATIVE
Glucose, UA: NEGATIVE mg/dL
HGB URINE DIPSTICK: NEGATIVE
Ketones, ur: NEGATIVE mg/dL
Nitrite: NEGATIVE
PH: 5.5 (ref 5.0–8.0)
Protein, ur: NEGATIVE mg/dL
SPECIFIC GRAVITY, URINE: 1.015 (ref 1.005–1.030)

## 2015-11-14 LAB — CBC
HCT: 43.3 % (ref 36.0–46.0)
Hemoglobin: 14.8 g/dL (ref 12.0–15.0)
MCH: 35 pg — ABNORMAL HIGH (ref 26.0–34.0)
MCHC: 34.2 g/dL (ref 30.0–36.0)
MCV: 102.4 fL — AB (ref 78.0–100.0)
PLATELETS: 184 10*3/uL (ref 150–400)
RBC: 4.23 MIL/uL (ref 3.87–5.11)
RDW: 13.2 % (ref 11.5–15.5)
WBC: 6.6 10*3/uL (ref 4.0–10.5)

## 2015-11-14 LAB — I-STAT TROPONIN, ED: Troponin i, poc: 0.01 ng/mL (ref 0.00–0.08)

## 2015-11-14 LAB — ETHANOL

## 2015-11-14 LAB — URINE MICROSCOPIC-ADD ON: RBC / HPF: NONE SEEN RBC/hpf (ref 0–5)

## 2015-11-14 LAB — DIFFERENTIAL
BASOS ABS: 0 10*3/uL (ref 0.0–0.1)
BASOS PCT: 1 %
Eosinophils Absolute: 0.1 10*3/uL (ref 0.0–0.7)
Eosinophils Relative: 1 %
Lymphocytes Relative: 19 %
Lymphs Abs: 1.2 10*3/uL (ref 0.7–4.0)
MONO ABS: 0.7 10*3/uL (ref 0.1–1.0)
Monocytes Relative: 11 %
NEUTROS ABS: 4.5 10*3/uL (ref 1.7–7.7)
NEUTROS PCT: 68 %

## 2015-11-14 LAB — RAPID URINE DRUG SCREEN, HOSP PERFORMED
AMPHETAMINES: NOT DETECTED
BENZODIAZEPINES: NOT DETECTED
Barbiturates: NOT DETECTED
Cocaine: NOT DETECTED
OPIATES: NOT DETECTED
Tetrahydrocannabinol: NOT DETECTED

## 2015-11-14 LAB — I-STAT CHEM 8, ED
BUN: 31 mg/dL — AB (ref 6–20)
CHLORIDE: 103 mmol/L (ref 101–111)
Calcium, Ion: 1.04 mmol/L — ABNORMAL LOW (ref 1.13–1.30)
Creatinine, Ser: 1.1 mg/dL — ABNORMAL HIGH (ref 0.44–1.00)
Glucose, Bld: 97 mg/dL (ref 65–99)
HCT: 45 % (ref 36.0–46.0)
Hemoglobin: 15.3 g/dL — ABNORMAL HIGH (ref 12.0–15.0)
POTASSIUM: 5.8 mmol/L — AB (ref 3.5–5.1)
SODIUM: 134 mmol/L — AB (ref 135–145)
TCO2: 22 mmol/L (ref 0–100)

## 2015-11-14 LAB — PROTIME-INR
INR: 0.95 (ref 0.00–1.49)
PROTHROMBIN TIME: 12.9 s (ref 11.6–15.2)

## 2015-11-14 MED ORDER — ENOXAPARIN SODIUM 40 MG/0.4ML ~~LOC~~ SOLN
40.0000 mg | SUBCUTANEOUS | Status: DC
  Administered 2015-11-14: 40 mg via SUBCUTANEOUS
  Filled 2015-11-14: qty 0.4

## 2015-11-14 MED ORDER — PANTOPRAZOLE SODIUM 40 MG PO TBEC
40.0000 mg | DELAYED_RELEASE_TABLET | Freq: Every day | ORAL | Status: DC
  Administered 2015-11-15: 40 mg via ORAL
  Filled 2015-11-14: qty 1

## 2015-11-14 MED ORDER — FLUTICASONE PROPIONATE 50 MCG/ACT NA SUSP: 1.0000 | Freq: Every day | NASAL | Status: DC

## 2015-11-14 MED ORDER — ACETAMINOPHEN 500 MG PO TABS: 500.0000 mg | ORAL_TABLET | Freq: Four times a day (QID) | ORAL | Status: DC | PRN

## 2015-11-14 MED ORDER — SENNOSIDES-DOCUSATE SODIUM 8.6-50 MG PO TABS: 1.0000 | ORAL_TABLET | Freq: Every evening | ORAL | Status: DC | PRN

## 2015-11-14 MED ORDER — LORATADINE 10 MG PO TABS
10.0000 mg | ORAL_TABLET | Freq: Every day | ORAL | Status: DC
  Administered 2015-11-15: 10 mg via ORAL
  Filled 2015-11-14 (×2): qty 1

## 2015-11-14 MED ORDER — STROKE: EARLY STAGES OF RECOVERY BOOK
Freq: Once | Status: AC
  Administered 2015-11-15: 11:00:00
  Filled 2015-11-14: qty 1

## 2015-11-14 MED ORDER — ASPIRIN EC 325 MG PO TBEC
325.0000 mg | DELAYED_RELEASE_TABLET | Freq: Every day | ORAL | Status: DC
  Filled 2015-11-14: qty 1

## 2015-11-14 NOTE — H&P (Signed)
History and Physical  ZIONAH VERTUCCI [id number] DOB: 1935-09-18 DOA: 11/14/2015  Referring physician: Dr Lacinda Axon, ED physician PCP: Rocky Morel, MD  Outpatient Specialists:   Dr. Domenic Polite (cardiology)  Chief Complaint: Headache, vision changes, word finding difficulties  HPI: KEIARRA PENDRY is a 80 y.o. female with a history of DVT, hypertension, hyperlipidemia, aortic dilatation regurgitation. She presents emergency department.  She reports vision changes at approximate 3 PM in the grocery store. This occurred in her right eye and lasted for a few moments. The patient drove home and 15 minutes after her vision disturbance, had word finding difficulty while trying to describe the events to her husband. These symptoms lasted for 15 minutes, during which time she was brought to the emergency department for evaluation. Her symptoms have gradually cleared with no provoking or palliating factors. A code stroke was called and the patient was evaluated by neuro tele.  Was determined that she had a TIA.  Her daughter does state that the patient has had labile blood pressures  Review of Systems:   Patient complains of mild abdominal pain  Pt denies any fevers, chills, nausea, vomiting, diarrhea, constipation, shortness of breath, dyspnea on exertion, orthopnea, cough, wheezing, palpitations, headache, vision changes, lightheadedness, dizziness, melena, rectal bleeding.  Review of systems are otherwise negative  Past Medical History  Diagnosis Date  . Hyperlipemia   . History of DVT (deep vein thrombosis)     Left leg 1997   . Aortic dilatation (HCC)   . Aortic regurgitation   . Essential hypertension    Past Surgical History  Procedure Laterality Date  . Appendectomy    . Carpal tunnel release    . Tonsillectomy     Social History:  reports that she has never smoked. She has never used smokeless tobacco. She reports that she does not drink alcohol or use illicit  drugs. Patient lives at [place]  . Biaxin [Clarithromycin]     headache  . Doxycycline     Cramps   . Estrogens     cramps  . Macrodantin [Nitrofurantoin]   . Statins     cramps  . Ceftin [Cefuroxime] Other (See Comments)    Rapid heart beat redness  . Ciprofloxacin Rash  . Sulfa Antibiotics Itching    REDNESS    Family History  Problem Relation Age of Onset  . Aneurysm Sister   . Valvular heart disease Sister   . Aneurysm Paternal Grandfather   . Heart attack Paternal Grandfather       Prior to Admission medications   Medication Sig Start Date End Date Taking? Authorizing Provider  doxycycline (VIBRA-TABS) 100 MG tablet Take 100-200 mg by mouth once. 2 tablets only prescribed 11/13/2015 11/13/15  Yes Historical Provider, MD  fluticasone (FLONASE) 50 MCG/ACT nasal spray Place into both nostrils daily.   Yes Historical Provider, MD  nebivolol (BYSTOLIC) 5 MG tablet Take 5 mg by mouth 2 (two) times daily.    Yes Historical Provider, MD  pantoprazole (PROTONIX) 40 MG tablet Take 40 mg by mouth daily.    Yes Historical Provider, MD  acetaminophen (TYLENOL) 500 MG tablet Take 500 mg by mouth every 6 (six) hours as needed for mild pain or moderate pain.     Historical Provider, MD  aspirin EC 325 MG tablet Take 325 mg by mouth daily.    Historical Provider, MD  CALCIUM-VITAMIN D PO Take 400 mg by mouth daily.    Historical Provider,  MD  Cholecalciferol (VITAMIN D3) 1000 units CAPS Take 1 capsule by mouth daily.     Historical Provider, MD  clobetasol cream (TEMOVATE) [id number] % Apply 1 application topically 2 (two) times daily.    Historical Provider, MD  fexofenadine (ALLEGRA) 180 MG tablet Take 180 mg by mouth daily.    Historical Provider, MD  Glucosamine-Chondroit-Vit C-Mn (GLUCOSAMINE 1500 COMPLEX PO) Take 1 tablet by mouth daily.     Historical Provider, MD  Omega-3 Fatty Acids (FISH OIL) 1200 MG CAPS Take by mouth 2 (two) times daily.    Historical  Provider, MD    Physical Exam: BP 168/89 mmHg  Pulse 62  Temp(Src) 98.4 F (36.9 C) (Oral)  Resp 13  Ht 5\' 4"  (1.626 m)  Wt 59.875 kg (132 lb)  BMI 22.65 kg/m2  SpO2 95%  General: Elderly Caucasian female. Awake and alert and oriented x3. No acute cardiopulmonary distress.  HEENT: Normocephalic atraumatic.  Right and left ears normal in appearance.  Pupils equal, round, reactive to light. Extraocular muscles are intact. Sclerae anicteric and noninjected.  Moist mucosal membranes. No mucosal lesions.  Neck: Neck supple without lymphadenopathy. No carotid bruits. No masses palpated.  Cardiovascular: Regular rate with normal S1-S2 sounds. No murmurs, rubs, gallops auscultated. No JVD.  Respiratory: Good respiratory effort with no wheezes, rales, rhonchi. Lungs clear to auscultation bilaterally.  No accessory muscle use. Abdomen: Soft, nontender, nondistended. Active bowel sounds. No masses or hepatosplenomegaly  Skin: No rashes, lesions, or ulcerations.  Dry, warm to touch. 2+ dorsalis pedis and radial pulses. Musculoskeletal: No calf or leg pain. All major joints not erythematous nontender.  No upper or lower joint deformation.  Good ROM.  No contractures  Psychiatric: Intact judgment and insight. Pleasant and cooperative. Neurologic: No focal neurological deficits. Strength is 5/5 and symmetric in upper and lower extremities.  Cranial nerves II through XII are grossly intact.           Labs on Admission: I have personally reviewed following labs and imaging studies  CBC:  Recent Labs Lab 11/14/15 1604 11/14/15 1617  WBC 6.6  --   NEUTROABS 4.5  --   HGB 14.8 15.3*  HCT 43.3 45.0  MCV 102.4*  --   PLT 184  --    Basic Metabolic Panel:  Recent Labs Lab 11/14/15 1604 11/14/15 1617  NA 132* 134*  K 4.1 5.8*  CL 103 103  CO2 23  --   GLUCOSE 103* 97  BUN 22* 31*  CREATININE 1.18* 1.10*  CALCIUM 8.6*  --    GFR: Estimated Creatinine Clearance: 36.4 mL/min (by C-G  formula based on Cr of 1.1). Liver Function Tests:  Recent Labs Lab 11/14/15 1604  AST 16  ALT 13*  ALKPHOS 59  BILITOT 1.0  PROT 6.7  ALBUMIN 3.8   No results for input(s): LIPASE, AMYLASE in the last 168 hours. No results for input(s): AMMONIA in the last 168 hours. Coagulation Profile:  Recent Labs Lab 11/14/15 1604  INR 0.95   Cardiac Enzymes: No results for input(s): CKTOTAL, CKMB, CKMBINDEX, TROPONINI in the last 168 hours. BNP (last 3 results) No results for input(s): PROBNP in the last 8760 hours. HbA1C: No results for input(s): HGBA1C in the last 72 hours. CBG: No results for input(s): GLUCAP in the last 168 hours. Lipid Profile: No results for input(s): CHOL, HDL, LDLCALC, TRIG, CHOLHDL, LDLDIRECT in the last 72 hours. Thyroid Function Tests: No results for input(s): TSH, T4TOTAL, FREET4, T3FREE, THYROIDAB in the  last 72 hours. Anemia Panel: No results for input(s): VITAMINB12, FOLATE, FERRITIN, TIBC, IRON, RETICCTPCT in the last 72 hours. Urine analysis: No results found for: COLORURINE, APPEARANCEUR, LABSPEC, PHURINE, GLUCOSEU, HGBUR, BILIRUBINUR, KETONESUR, PROTEINUR, UROBILINOGEN, NITRITE, LEUKOCYTESUR Sepsis Labs: @LABRCNTIP (procalcitonin:4,lacticidven:4) )No results found for this or any previous visit (from the past 240 hour(s)).   Radiological Exams on Admission: Ct Head Wo Contrast  11/14/2015  CLINICAL DATA:  80 year old female with a history of blurred vision and difficulty word finding. EXAM: CT HEAD WITHOUT CONTRAST TECHNIQUE: Contiguous axial images were obtained from the base of the skull through the vertex without intravenous contrast. COMPARISON:  None. FINDINGS: Unremarkable appearance of the calvarium without acute fracture or aggressive lesion. Unremarkable appearance of the scalp soft tissues. Unremarkable appearance of the bilateral orbits. Mastoid air cells are clear. No significant paranasal sinus disease No acute intracranial hemorrhage.   No midline shift or mass effect. Gray-white differentiation maintained. Unremarkable configuration of the ventricles. Calcifications of the intracranial vasculature. IMPRESSION: No CT evidence of acute intracranial abnormality. Intracranial atherosclerotic calcifications. These results were called by telephone at the time of interpretation on 11/14/2015 at 4:12 pm to Dr. Nat Christen , who verbally acknowledged these results. Signed, Dulcy Fanny. Earleen Newport, DO [facility] Electronically Signed   By: Corrie Mckusick D.O.   On: 11/14/2015 16:16   Mr Jodene Nam Head Wo Contrast  11/14/2015  CLINICAL DATA:  Acute onset of dizziness and visual disturbance 3 hours ago. EXAM: MRI HEAD WITHOUT CONTRAST MRA HEAD WITHOUT CONTRAST TECHNIQUE: Multiplanar, multiecho pulse sequences of the brain and surrounding structures were obtained without intravenous contrast. Angiographic images of the head were obtained using MRA technique without contrast. COMPARISON:  Head CT same day. FINDINGS: MRI HEAD FINDINGS Diffusion imaging does not show any acute or subacute infarction. The brainstem and cerebellum are normal. Within the cerebral hemispheres, there are scattered punctate foci of T2 and FLAIR signal consistent with mild chronic small-vessel change, fairly typical for age. No cortical or large vessel territory infarction. No mass lesion, hemorrhage, hydrocephalus or extra-axial collection. No pituitary mass. No inflammatory sinus disease. No skull or skullbase lesion. MRA HEAD FINDINGS Both internal carotid arteries are widely patent into the brain. No siphon stenosis. The anterior and middle cerebral vessels are normal without proximal stenosis, aneurysm or vascular malformation. Both vertebral arteries are widely patent to the basilar. No basilar stenosis. Posterior circulation branch vessels are patent. IMPRESSION: No acute or reversible finding. Mild chronic small-vessel change of the  cerebral hemispheric white matter, fairly typical for age. Negative intracranial MR angiography of the large and medium size vessels. Electronically Signed   By: Nelson Chimes M.D.   On: 11/14/2015 18:20   Mr Brain Wo Contrast  11/14/2015  CLINICAL DATA:  Acute onset of dizziness and visual disturbance 3 hours ago. EXAM: MRI HEAD WITHOUT CONTRAST MRA HEAD WITHOUT CONTRAST TECHNIQUE: Multiplanar, multiecho pulse sequences of the brain and surrounding structures were obtained without intravenous contrast. Angiographic images of the head were obtained using MRA technique without contrast. COMPARISON:  Head CT same day. FINDINGS: MRI HEAD FINDINGS Diffusion imaging does not show any acute or subacute infarction. The brainstem and cerebellum are normal. Within the cerebral hemispheres, there are scattered punctate foci of T2 and FLAIR signal consistent with mild chronic small-vessel change, fairly typical for age. No cortical or large vessel territory infarction. No mass lesion, hemorrhage, hydrocephalus or extra-axial collection. No pituitary mass. No inflammatory sinus disease. No skull or skullbase lesion.  MRA HEAD FINDINGS Both internal carotid arteries are widely patent into the brain. No siphon stenosis. The anterior and middle cerebral vessels are normal without proximal stenosis, aneurysm or vascular malformation. Both vertebral arteries are widely patent to the basilar. No basilar stenosis. Posterior circulation branch vessels are patent. IMPRESSION: No acute or reversible finding. Mild chronic small-vessel change of the cerebral hemispheric white matter, fairly typical for age. Negative intracranial MR angiography of the large and medium size vessels. Electronically Signed   By: Nelson Chimes M.D.   On: 11/14/2015 18:20    EKG: Independently reviewed. Sinus rhythm. No ST elevation or depression.  Assessment/Plan: Active Problems:   TIA (transient ischemic attack)   Essential hypertension    This  patient was discussed with the ED physician, including pertinent vitals, physical exam findings, labs, and imaging.  We also discussed care given by the ED provider.  #1 TIA  Patient obtain MRI/MRA in the emergency department at my request  Observation overnight with telemetry  Echocardiogram, carotid Dopplers in the morning  Lipid panel in the morning - it should be noted that the patient has had difficulty tolerating statin drugs and is sensitive to medications in general.  A1c  PT/OT/speech consults #2 essential hypertension  Will allow for labile blood pressure at this point  Restart the pressure control in the morning  DVT prophylaxis: Lovenox Consultants: PT/OT/speech Code Status: Full code Family Communication: Daughter in the room  Disposition Plan: Observation on telemetry   Truett Mainland, DO [facility] Pager [phone]  If 7PM-7AM, please contact night-coverage [email or web address] Password [email or web address]

## 2015-11-14 NOTE — ED Notes (Signed)
Chuck from lab notified pt's chemistry results were severely hemolyzed and specimen would be recollected.

## 2015-11-14 NOTE — ED Notes (Signed)
Pt was taken to CT from triage.  Has not yet arrived in room.

## 2015-11-14 NOTE — ED Notes (Signed)
Pt returned from MRI and Dr. Nehemiah Settle at bedside.

## 2015-11-14 NOTE — ED Notes (Signed)
Patient c/o dizziness, vision change and memory problems onset 1500

## 2015-11-14 NOTE — Progress Notes (Signed)
CODE STROKE 16:58  BEEPER AND CALL 1603  EXAM STARTED 1605 EXAM FINISHED 1608 IMAGES SENT TO [place]

## 2015-11-14 NOTE — Progress Notes (Signed)
Pt arrived to unit rm 341 in stable condition. Report received from Sofie Rower ED, RN.  Dr. Nehemiah Settle paged.

## 2015-11-14 NOTE — ED Notes (Signed)
Pt taken to MRI  

## 2015-11-14 NOTE — ED Notes (Signed)
Awaiting completion of Dr. Glenna Durand eval before transporting to floor.

## 2015-11-14 NOTE — ED Provider Notes (Addendum)
CSN: [id number]     Arrival date & time 11/14/15  1550 History   First MD Initiated Contact with Patient 11/14/15 1604     Chief Complaint  Patient presents with  . Code Stroke    @EDPCLEARED @ (Consider location/radiation/quality/duration/timing/severity/associated sxs/prior Treatment) HPI.Marland KitchenMarland KitchenMarland KitchenLevel V caveat for urgent need for intervention. Patient was at the grocery store approximately 3 PM when she became dizzy and had difficulty finding her words with associated visual changes. Prodromal headache for 2 weeks. Symptoms have since improved. Past medical history includes hypertension and hyperlipidemia.  Past Medical History  Diagnosis Date  . Essential hypertension   . Hyperlipemia   . History of DVT (deep vein thrombosis)     Left leg 1997   . Aortic dilatation (HCC)   . Aortic regurgitation    Past Surgical History  Procedure Laterality Date  . Appendectomy    . Carpal tunnel release    . Tonsillectomy     Family History  Problem Relation Age of Onset  . Aneurysm Sister   . Valvular heart disease Sister   . Aneurysm Paternal Grandfather   . Heart attack Paternal Grandfather    Social History  Substance Use Topics  . Smoking status: Never Smoker   . Smokeless tobacco: Never Used  . Alcohol Use: No   OB History    No data available     Review of Systems  All other systems reviewed and are negative.     Allergies  Biaxin; Doxycycline; Estrogens; Macrodantin; Statins; Ceftin; Ciprofloxacin; and Sulfa antibiotics  Home Medications   Prior to Admission medications   Medication Sig Start Date End Date Taking? Authorizing Provider  doxycycline (VIBRA-TABS) 100 MG tablet Take 100-200 mg by mouth once. 2 tablets only prescribed 11/13/2015 11/13/15  Yes Historical Provider, MD  acetaminophen (TYLENOL) 500 MG tablet Take 500 mg by mouth every 6 (six) hours as needed.    Historical Provider, MD  aspirin EC 325 MG tablet Take 325 mg by mouth daily.    Historical Provider,  MD  CALCIUM-VITAMIN D PO Take 400 mg by mouth daily.    Historical Provider, MD  Cholecalciferol (VITAMIN D3) 1000 units CAPS Take by mouth daily.    Historical Provider, MD  clobetasol cream (TEMOVATE) [id number] % Apply 1 application topically 2 (two) times daily.    Historical Provider, MD  fexofenadine (ALLEGRA) 180 MG tablet Take 180 mg by mouth daily.    Historical Provider, MD  fluconazole (DIFLUCAN) 100 MG tablet 10 day course 10/27/15   Historical Provider, MD  fluticasone (FLONASE) 50 MCG/ACT nasal spray Place into both nostrils daily.    Historical Provider, MD  Glucosamine-Chondroit-Vit C-Mn (GLUCOSAMINE 1500 COMPLEX PO) Take by mouth daily.    Historical Provider, MD  nebivolol (BYSTOLIC) 5 MG tablet Take 5 mg by mouth daily.    Historical Provider, MD  Omega-3 Fatty Acids (FISH OIL) 1200 MG CAPS Take by mouth 2 (two) times daily.    Historical Provider, MD  pantoprazole (PROTONIX) 40 MG tablet Take 40 mg by mouth daily.     Historical Provider, MD   BP 172/70 mmHg  Pulse 61  Temp(Src) 98.4 F (36.9 C) (Oral)  Resp 14  Ht 5\' 4"  (1.626 m)  Wt 132 lb (59.875 kg)  BMI 22.65 kg/m2  SpO2 95% Physical Exam  Constitutional: She is oriented to person, place, and time. She appears well-developed and well-nourished.  HENT:  Head: Normocephalic and atraumatic.  Eyes: Conjunctivae and EOM are normal. Pupils are equal,  round, and reactive to light.  Neck: Normal range of motion. Neck supple.  Cardiovascular: Normal rate and regular rhythm.   Pulmonary/Chest: Effort normal and breath sounds normal.  Abdominal: Soft. Bowel sounds are normal.  Musculoskeletal: Normal range of motion.  Neurological: She is alert and oriented to person, place, and time.  Skin: Skin is warm and dry.  Psychiatric: She has a normal mood and affect. Her behavior is normal.  Nursing note and vitals reviewed.   ED Course  Procedures (including critical care time) Labs Review Labs Reviewed  CBC - Abnormal;  Notable for the following:    MCV 102.4 (*)    MCH 35.0 (*)    All other components within normal limits  I-STAT CHEM 8, ED - Abnormal; Notable for the following:    Sodium 134 (*)    Potassium 5.8 (*)    BUN 31 (*)    Creatinine, Ser 1.10 (*)    Calcium, Ion 1.04 (*)    Hemoglobin 15.3 (*)    All other components within normal limits  ETHANOL  PROTIME-INR  APTT  DIFFERENTIAL  COMPREHENSIVE METABOLIC PANEL  URINE RAPID DRUG SCREEN, HOSP PERFORMED  URINALYSIS, ROUTINE W REFLEX MICROSCOPIC (NOT AT [facility])  Randolm Idol, ED    Imaging Review Ct Head Wo Contrast  11/14/2015  CLINICAL DATA:  80 year old female with a history of blurred vision and difficulty word finding. EXAM: CT HEAD WITHOUT CONTRAST TECHNIQUE: Contiguous axial images were obtained from the base of the skull through the vertex without intravenous contrast. COMPARISON:  None. FINDINGS: Unremarkable appearance of the calvarium without acute fracture or aggressive lesion. Unremarkable appearance of the scalp soft tissues. Unremarkable appearance of the bilateral orbits. Mastoid air cells are clear. No significant paranasal sinus disease No acute intracranial hemorrhage.  No midline shift or mass effect. Gray-white differentiation maintained. Unremarkable configuration of the ventricles. Calcifications of the intracranial vasculature. IMPRESSION: No CT evidence of acute intracranial abnormality. Intracranial atherosclerotic calcifications. These results were called by telephone at the time of interpretation on 11/14/2015 at 4:12 pm to Dr. Nat Christen , who verbally acknowledged these results. Signed, Dulcy Fanny. Earleen Newport, DO [facility]iology Electronically Signed   By: Corrie Mckusick D.O.   On: 11/14/2015 16:16   I have personally reviewed and evaluated these images and lab results as part of my medical decision-making.   EKG Interpretation None      MDM   Final diagnoses:   Transient cerebral ischemia, unspecified transient cerebral ischemia type  Hyperkalemia    Code stroke was initially activated. Patient symptoms seemed to improve. I suspect a transient ischemic attack. Discussed with neurologist. Admit to general medicine.    Nat Christen, MD 11/14/15 [place], MD 11/17/15 [phone]

## 2015-11-14 NOTE — ED Notes (Signed)
Pt arrived to room from CT accompanied by triage nurse.

## 2015-11-15 ENCOUNTER — Observation Stay (HOSPITAL_COMMUNITY): Payer: PPO

## 2015-11-15 ENCOUNTER — Observation Stay (HOSPITAL_BASED_OUTPATIENT_CLINIC_OR_DEPARTMENT_OTHER): Payer: PPO

## 2015-11-15 DIAGNOSIS — E041 Nontoxic single thyroid nodule: Secondary | ICD-10-CM | POA: Diagnosis not present

## 2015-11-15 DIAGNOSIS — I6523 Occlusion and stenosis of bilateral carotid arteries: Secondary | ICD-10-CM | POA: Diagnosis not present

## 2015-11-15 DIAGNOSIS — I10 Essential (primary) hypertension: Secondary | ICD-10-CM | POA: Diagnosis not present

## 2015-11-15 DIAGNOSIS — G459 Transient cerebral ischemic attack, unspecified: Secondary | ICD-10-CM

## 2015-11-15 LAB — ECHOCARDIOGRAM COMPLETE
AOASC: 43 cm
AV Area mean vel: 2.59 cm2
AV area mean vel ind: 1.59 cm2/m2
AV vel: 2.71
AVA: 2.71 cm2
AVAREAVTI: 2.72 cm2
AVAREAVTIIND: 1.66 cm2/m2
AVCELMEANRAT: 0.83
AVG: 4 mmHg
AVLVOTPG: 4 mmHg
AVPG: 6 mmHg
AVPKVEL: 119 cm/s
Ao pk vel: 0.87 m/s
CHL CUP AV PEAK INDEX: 1.67
DOP CAL AO MEAN VELOCITY: 87.5 cm/s
E decel time: 338 msec
E/e' ratio: 18.88
FS: 33 % (ref 28–44)
HEIGHTINCHES: 64 in
IVS/LV PW RATIO, ED: 1.3
LA ID, A-P, ES: 33 mm
LA diam end sys: 33 mm
LA diam index: 2.02 cm/m2
LA vol A4C: 30.7 ml
LAVOL: 33.6 mL
LAVOLIN: 20.6 mL/m2
LDCA: 3.14 cm2
LV TDI E'MEDIAL: 7.72
LV e' LATERAL: 5.11 cm/s
LV sys vol index: 15 mL/m2
LVDIAVOL: 58 mL (ref 46–106)
LVDIAVOLIN: 36 mL/m2
LVEEAVG: 18.88
LVEEMED: 18.88
LVOT VTI: 26.9 cm
LVOT peak VTI: 0.86 cm
LVOT peak vel: 103 cm/s
LVOTD: 20 mm
LVOTSV: 84 mL
LVSYSVOL: 24 mL (ref 14–42)
MV Dec: 338
MV pk A vel: 110 m/s
MV pk E vel: 96.5 m/s
MVPG: 4 mmHg
PW: 10.2 mm — AB (ref 0.6–1.1)
RV TAPSE: 23.6 mm
Reg peak vel: 244 cm/s
Simpson's disk: 59
Stroke v: 34 ml
TDI e' lateral: 5.11
TR max vel: 244 cm/s
VTI: 31.2 cm
Valve area index: 1.66
WEIGHTICAEL: 2089.6 [oz_av]

## 2015-11-15 LAB — LIPID PANEL
CHOLESTEROL: 248 mg/dL — AB (ref 0–200)
HDL: 48 mg/dL (ref >40)
LDL Cholesterol: 183 mg/dL — ABNORMAL HIGH (ref 0–99)
TRIGLYCERIDES: 87 mg/dL (ref <150)
Total CHOL/HDL Ratio: 5.2 RATIO
VLDL: 17 mg/dL (ref 0–40)

## 2015-11-15 MED ORDER — CLOPIDOGREL BISULFATE 75 MG PO TABS: 75.0000 mg | ORAL_TABLET | Freq: Every day | ORAL | Status: AC

## 2015-11-15 MED ORDER — ASPIRIN 81 MG PO TBEC: 81.0000 mg | DELAYED_RELEASE_TABLET | Freq: Every day | ORAL | Status: AC

## 2015-11-15 MED ORDER — ATORVASTATIN CALCIUM 40 MG PO TABS: 40.0000 mg | ORAL_TABLET | Freq: Every day | ORAL | Status: DC

## 2015-11-15 MED ORDER — ATORVASTATIN CALCIUM 40 MG PO TABS
40.0000 mg | ORAL_TABLET | Freq: Every day | ORAL | Status: DC
Start: 2015-11-15 — End: 2016-09-03

## 2015-11-15 MED ORDER — ASPIRIN EC 81 MG PO TBEC
81.0000 mg | DELAYED_RELEASE_TABLET | Freq: Every day | ORAL | Status: DC
  Administered 2015-11-15: 81 mg via ORAL
  Filled 2015-11-15: qty 1

## 2015-11-15 MED ORDER — CLOPIDOGREL BISULFATE 75 MG PO TABS: 75.0000 mg | ORAL_TABLET | Freq: Every day | ORAL | Status: DC

## 2015-11-15 NOTE — Progress Notes (Signed)
*  PRELIMINARY RESULTS* Echocardiogram 2D Echocardiogram has been performed.  Allison Morris Allison Morris Allison Morris 11/15/2015, 10:57 AM 

## 2015-11-15 NOTE — Progress Notes (Signed)
Patient alert and oriented, independent, VSS, pt. Tolerating diet well. No complaints of pain or nausea. Pt. Had IV removed tip intact. Pt. Had prescriptions given. Pt. Voiced understanding of discharge instructions with no further questions. Pt. Discharged via wheelchair with auxilliary.  

## 2015-11-15 NOTE — Progress Notes (Signed)
[facility] PROGRESS NOTE  Allison Morris [id number] DOB: 16-Sep-1935    PCP:   Rocky Morel, MD   HPI: Allison Morris is an righthanded,  80 y.o. female, with hx of prior DVT, HLD, HTN, aortic dilatation and regurgitation, admitted for TIA with ictus at 3pm yesterday.  She was adamant that she has been compliant with her full dose ASA.  MRA/MRI of the brain showed no definite CVA, and no discernable blockage.  She has no further symptoms.    Rewiew of Systems:  Constitutional: Negative for malaise, fever and chills. No significant weight loss or weight gain Eyes: Negative for eye pain, redness and discharge, diplopia, visual changes, or flashes of light. ENMT: Negative for ear pain, hoarseness, nasal congestion, sinus pressure and sore throat. No headaches; tinnitus, drooling, or problem swallowing. Cardiovascular: Negative for chest pain, palpitations, diaphoresis, dyspnea and peripheral edema. ; No orthopnea, PND Respiratory: Negative for cough, hemoptysis, wheezing and stridor. No pleuritic chestpain. Gastrointestinal: Negative for nausea, vomiting, diarrhea, constipation, abdominal pain, melena, blood in stool, hematemesis, jaundice and rectal bleeding.    Genitourinary: Negative for frequency, dysuria, incontinence,flank pain and hematuria; Musculoskeletal: Negative for back pain and neck pain. Negative for swelling and trauma.;  Skin: . Negative for pruritus, rash, abrasions, bruising and skin lesion.; ulcerations Neuro: Negative for headache, lightheadedness and neck stiffness. Negative for weakness, altered level of consciousness , altered mental status, extremity weakness, burning feet, involuntary movement, seizure and syncope.  Psych: negative for anxiety, depression, insomnia, tearfulness, panic attacks, hallucinations, paranoia, suicidal or homicidal ideation   Past Medical History  Diagnosis Date  . Hyperlipemia   . History of DVT (deep vein thrombosis)      Left leg 1997   . Aortic dilatation (HCC)   . Aortic regurgitation   . Essential hypertension     Past Surgical History  Procedure Laterality Date  . Appendectomy    . Carpal tunnel release    . Tonsillectomy      Medications:  HOME MEDS: Prior to Admission medications   Medication Sig Start Date End Date Taking? Authorizing Provider  acetaminophen (TYLENOL) 500 MG tablet Take 500 mg by mouth every 6 (six) hours as needed for mild pain or moderate pain.    Yes Historical Provider, MD  aspirin EC 325 MG tablet Take 325 mg by mouth daily.   Yes Historical Provider, MD  Cholecalciferol (VITAMIN D3) 1000 units CAPS Take 1 capsule by mouth daily.    Yes Historical Provider, MD  clobetasol cream (TEMOVATE) [id number] % Apply 1 application topically daily as needed (for irritation).    Yes Historical Provider, MD  doxycycline (VIBRA-TABS) 100 MG tablet Take 100-200 mg by mouth once. 2 tablets only prescribed 11/13/2015 11/13/15  Yes Historical Provider, MD  fexofenadine (ALLEGRA) 180 MG tablet Take 180 mg by mouth daily.   Yes Historical Provider, MD  fluticasone (FLONASE) 50 MCG/ACT nasal spray Place 1 spray into both nostrils daily.    Yes Historical Provider, MD  Glucosamine-Chondroit-Vit C-Mn (GLUCOSAMINE 1500 COMPLEX PO) Take 1 tablet by mouth daily.    Yes Historical Provider, MD  nebivolol (BYSTOLIC) 5 MG tablet Take 5 mg by mouth 2 (two) times daily.    Yes Historical Provider, MD  Omega-3 Fatty Acids (FISH OIL) 1200 MG CAPS Take 1 capsule by mouth 2 (two) times daily.    Yes Historical Provider, MD  pantoprazole (PROTONIX) 40 MG tablet Take 40 mg by mouth daily.    Yes Historical Provider, MD  Allergies:  Allergies  Allergen Reactions  . Biaxin [Clarithromycin]     headache  . Doxycycline     Cramps   . Estrogens     cramps  . Macrodantin [Nitrofurantoin]   . Statins     cramps  . Ceftin [Cefuroxime] Other (See Comments)    Rapid heart beat redness  . Ciprofloxacin Rash   . Sulfa Antibiotics Itching    REDNESS    Social History:   reports that she has never smoked. She has never used smokeless tobacco. She reports that she does not drink alcohol or use illicit drugs.  Family History: Family History  Problem Relation Age of Onset  . Aneurysm Sister   . Valvular heart disease Sister   . Aneurysm Paternal Grandfather   . Heart attack Paternal Grandfather      Physical Exam: Filed Vitals:   11/15/15 0000 11/15/15 0200 11/15/15 0400 11/15/15 0600  BP: 111/56 150/60 137/68 141/65  Pulse: 61 53 55 51  Temp: 98.3 F (36.8 C) 98 F (36.7 C) 98.2 F (36.8 C) 98.2 F (36.8 C)  TempSrc: Oral Oral Oral Oral  Resp: 16 16 16 16   Height:      Weight:      SpO2: 95% 94% 97% 96%   Blood pressure 141/65, pulse 51, temperature 98.2 F (36.8 C), temperature source Oral, resp. rate 16, height 5\' 4"  (1.626 m), weight 59.24 kg (130 lb 9.6 oz), SpO2 96 %.  GEN:  Pleasant  patient lying in the stretcher in no acute distress; cooperative with exam. PSYCH:  alert and oriented x4; does not appear anxious or depressed; affect is appropriate. HEENT: Mucous membranes pink and anicteric; PERRLA; EOM intact; no cervical lymphadenopathy nor thyromegaly or carotid bruit; no JVD; There were no stridor. Neck is very supple. Breasts:: Not examined CHEST WALL: No tenderness CHEST: Normal respiration, clear to auscultation bilaterally.  HEART: Regular rate and rhythm.  There are no murmur, rub, or gallops.   BACK: No kyphosis or scoliosis; no CVA tenderness ABDOMEN: soft and non-tender; no masses, no organomegaly, normal abdominal bowel sounds; no pannus; no intertriginous candida. There is no rebound and no distention. Rectal Exam: Not done EXTREMITIES: No bone or joint deformity; age-appropriate arthropathy of the hands and knees; no edema; no ulcerations.  There is no calf tenderness. Genitalia: not examined PULSES: 2+ and symmetric SKIN: Normal hydration no rash or  ulceration CNS: Cranial nerves 2-12 grossly intact no focal lateralizing neurologic deficit.  Speech is fluent; uvula elevated with phonation, facial symmetry and tongue midline. DTR are normal bilaterally, cerebella exam is intact, barbinski is negative and strengths are equaled bilaterally.  No sensory loss.   Labs on Admission:  Basic Metabolic Panel:  Recent Labs Lab 11/14/15 1604 11/14/15 1617  NA 132* 134*  K 4.1 5.8*  CL 103 103  CO2 23  --   GLUCOSE 103* 97  BUN 22* 31*  CREATININE 1.18* 1.10*  CALCIUM 8.6*  --    Liver Function Tests:  Recent Labs Lab 11/14/15 1604  AST 16  ALT 13*  ALKPHOS 59  BILITOT 1.0  PROT 6.7  ALBUMIN 3.8   CBC:  Recent Labs Lab 11/14/15 1604 11/14/15 1617  WBC 6.6  --   NEUTROABS 4.5  --   HGB 14.8 15.3*  HCT 43.3 45.0  MCV 102.4*  --   PLT 184  --     Radiological Exams on Admission: Ct Head Wo Contrast  11/14/2015  CLINICAL DATA:  80 year old female with a history of blurred vision and difficulty word finding. EXAM: CT HEAD WITHOUT CONTRAST TECHNIQUE: Contiguous axial images were obtained from the base of the skull through the vertex without intravenous contrast. COMPARISON:  None. FINDINGS: Unremarkable appearance of the calvarium without acute fracture or aggressive lesion. Unremarkable appearance of the scalp soft tissues. Unremarkable appearance of the bilateral orbits. Mastoid air cells are clear. No significant paranasal sinus disease No acute intracranial hemorrhage.  No midline shift or mass effect. Gray-white differentiation maintained. Unremarkable configuration of the ventricles. Calcifications of the intracranial vasculature. IMPRESSION: No CT evidence of acute intracranial abnormality. Intracranial atherosclerotic calcifications. These results were called by telephone at the time of interpretation on 11/14/2015 at 4:12 pm to Dr. Nat Christen , who verbally acknowledged these results. Signed, Dulcy Fanny. Earleen Newport, DO [facility] Electronically Signed   By: Corrie Mckusick D.O.   On: 11/14/2015 16:16   Mr Jodene Nam Head Wo Contrast  11/14/2015  CLINICAL DATA:  Acute onset of dizziness and visual disturbance 3 hours ago. EXAM: MRI HEAD WITHOUT CONTRAST MRA HEAD WITHOUT CONTRAST TECHNIQUE: Multiplanar, multiecho pulse sequences of the brain and surrounding structures were obtained without intravenous contrast. Angiographic images of the head were obtained using MRA technique without contrast. COMPARISON:  Head CT same day. FINDINGS: MRI HEAD FINDINGS Diffusion imaging does not show any acute or subacute infarction. The brainstem and cerebellum are normal. Within the cerebral hemispheres, there are scattered punctate foci of T2 and FLAIR signal consistent with mild chronic small-vessel change, fairly typical for age. No cortical or large vessel territory infarction. No mass lesion, hemorrhage, hydrocephalus or extra-axial collection. No pituitary mass. No inflammatory sinus disease. No skull or skullbase lesion. MRA HEAD FINDINGS Both internal carotid arteries are widely patent into the brain. No siphon stenosis. The anterior and middle cerebral vessels are normal without proximal stenosis, aneurysm or vascular malformation. Both vertebral arteries are widely patent to the basilar. No basilar stenosis. Posterior circulation branch vessels are patent. IMPRESSION: No acute or reversible finding. Mild chronic small-vessel change of the cerebral hemispheric white matter, fairly typical for age. Negative intracranial MR angiography of the large and medium size vessels. Electronically Signed   By: Nelson Chimes M.D.   On: 11/14/2015 18:20   Mr Brain Wo Contrast  11/14/2015  CLINICAL DATA:  Acute onset of dizziness and visual disturbance 3 hours ago. EXAM: MRI HEAD WITHOUT CONTRAST MRA HEAD WITHOUT CONTRAST TECHNIQUE: Multiplanar, multiecho pulse sequences of the brain and surrounding structures  were obtained without intravenous contrast. Angiographic images of the head were obtained using MRA technique without contrast. COMPARISON:  Head CT same day. FINDINGS: MRI HEAD FINDINGS Diffusion imaging does not show any acute or subacute infarction. The brainstem and cerebellum are normal. Within the cerebral hemispheres, there are scattered punctate foci of T2 and FLAIR signal consistent with mild chronic small-vessel change, fairly typical for age. No cortical or large vessel territory infarction. No mass lesion, hemorrhage, hydrocephalus or extra-axial collection. No pituitary mass. No inflammatory sinus disease. No skull or skullbase lesion. MRA HEAD FINDINGS Both internal carotid arteries are widely patent into the brain. No siphon stenosis. The anterior and middle cerebral vessels are normal without proximal stenosis, aneurysm or vascular malformation. Both vertebral arteries are widely patent to the basilar. No basilar stenosis. Posterior circulation branch vessels are patent. IMPRESSION: No acute or reversible finding. Mild chronic small-vessel change of the cerebral hemispheric white matter, fairly typical for age. Negative intracranial  MR angiography of the large and medium size vessels. Electronically Signed   By: Nelson Chimes M.D.   On: 11/14/2015 18:20    Assessment/Plan Present on Admission:  . TIA (transient ischemic attack) . Essential hypertension  PLAN:  TIA:  She may be right brain dominant, as her visual changes was on the right eye, and she was right handed ( 5% of the general population).  In any event, will proceed with ECHO and carotid.  Differential should also included complicated migraine.  We discussed, and she agreed with DUAT.  Will reduce her ASA to 81mg  and add Plavix 75mg  per day.   Check lipid profile and Homocysteine level.   HTN:  Will allow permissive HTN to about 99991111 systolic.   Other plans as per orders. Code Status: FULL Haskel Khan, MD.  FACP [facility] Pager [phone] 7pm to 7am.  11/15/2015, 8:18 AM

## 2015-11-15 NOTE — Progress Notes (Signed)
PT Cancellation Note  Patient Details Name: Allison Morris MRN: [id number] DOB: 1935/08/26   Cancelled Treatment:     Called Nurse who stated pt was at previous level of function.   Rayetta Humphrey, PT CLT [phone] 11/15/2015, 11:53 AM

## 2015-11-15 NOTE — Discharge Summary (Signed)
Physician Discharge Summary  Allison Morris [id number] DOB: 14-Mar-1936 DOA: 11/14/2015  PCP: Rocky Morel, MD  Admit date: 11/14/2015 Discharge date: 11/15/2015  Time spent: 35 minutes  Recommendations for Outpatient Follow-up:  1. Follow up with PCP next week.    Discharge Diagnoses:  Active Problems:   TIA (transient ischemic attack)   Essential hypertension  Thyroid nodule.   Coratid plagues   Hyperlipidemia.   Discharge Condition: improved with no neurological deficit.   Diet recommendation: heart healthy diet.   Filed Weights   11/14/15 1555 11/14/15 2000  Weight: 59.875 kg (132 lb) 59.24 kg (130 lb 9.6 oz)    History of present illness: Patient was admitted for TIA by Dr Nehemiah Settle on November 11, 2015.  As per his H and P:  " Allison Morris is a 80 y.o. female with a history of DVT, hypertension, hyperlipidemia, aortic dilatation regurgitation. She presents emergency department. She reports vision changes at approximate 3 PM in the grocery store. This occurred in her right eye and lasted for a few moments. The patient drove home and 15 minutes after her vision disturbance, had word finding difficulty while trying to describe the events to her husband. These symptoms lasted for 15 minutes, during which time she was brought to the emergency department for evaluation. Her symptoms have gradually cleared with no provoking or palliating factors. A code stroke was called and the patient was evaluated by neuro tele. Was determined that she had a TIA.  Her daughter does state that the patient has had labile blood pressures   Hospital Course:  Patient was not a candidate for TPA as she had TIA with no residual neurological deficit.  Her work up included MRI/MRA of her brain which showed no acute or subacute CVA, and no stenosis.  Her ECHO was normal, and her carotid showed plagues but no obstructive disease.  Since she had been on ASA faithfully, I will discharge her on DUAT,  namingly ASA reduced to 81mg  and Plavix 75mg  per day was added.  Her lipid profile was quite high, and she will try lipitor again, along with CoQ10 to prevent muscle aches.  She has been on Fish oil, and i have recommended that she continues them, up to 4g per day if she can tolerate it.   She will get her BP under better controlled. She will see her PCP in follow up, and neurology referral will be left upon her PCP.  Incidentally, a 2cm thyroid nodule was found, and she was informed of it, and consideration for a dedicated thyroid US will be suggested.  Thank you so much and Good Day.    Procedures:  MRI/MRA   ECHO  Carotid sonogram.   Consultations:  None.   Discharge Exam: Filed Vitals:   11/15/15 1020 11/15/15 1425  BP: 148/52 145/60  Pulse: 56 60  Temp: 98 F (36.7 C) 98 F (36.7 C)  Resp: 18 20     Discharge Instructions   Discharge Instructions    Diet - low sodium heart healthy    Complete by:  As directed      Discharge instructions    Complete by:  As directed   Take your medication as directed.     Increase activity slowly    Complete by:  As directed           Current Discharge Medication List    START taking these medications   Details  atorvastatin (LIPITOR) 40 MG tablet Take  1 tablet (40 mg total) by mouth daily at 6 PM. Qty: 30 tablet, Refills: 1    clopidogrel (PLAVIX) 75 MG tablet Take 1 tablet (75 mg total) by mouth daily. Qty: 30 tablet, Refills: 2      CONTINUE these medications which have CHANGED   Details  aspirin EC 81 MG EC tablet Take 1 tablet (81 mg total) by mouth daily. Qty: 100 tablet, Refills: 1      CONTINUE these medications which have NOT CHANGED   Details  acetaminophen (TYLENOL) 500 MG tablet Take 500 mg by mouth every 6 (six) hours as needed for mild pain or moderate pain.     Cholecalciferol (VITAMIN D3) 1000 units CAPS Take 1 capsule by mouth daily.     clobetasol cream (TEMOVATE) [id number] % Apply 1 application topically  daily as needed (for irritation).     fexofenadine (ALLEGRA) 180 MG tablet Take 180 mg by mouth daily.    fluticasone (FLONASE) 50 MCG/ACT nasal spray Place 1 spray into both nostrils daily.     Glucosamine-Chondroit-Vit C-Mn (GLUCOSAMINE 1500 COMPLEX PO) Take 1 tablet by mouth daily.     nebivolol (BYSTOLIC) 5 MG tablet Take 5 mg by mouth 2 (two) times daily.     Omega-3 Fatty Acids (FISH OIL) 1200 MG CAPS Take 1 capsule by mouth 2 (two) times daily.     pantoprazole (PROTONIX) 40 MG tablet Take 40 mg by mouth daily.       STOP taking these medications     doxycycline (VIBRA-TABS) 100 MG tablet        Allergies  Allergen Reactions  . Biaxin [Clarithromycin]     headache  . Doxycycline     Cramps   . Estrogens     cramps  . Macrodantin [Nitrofurantoin]   . Statins     cramps  . Ceftin [Cefuroxime] Other (See Comments)    Rapid heart beat redness  . Ciprofloxacin Rash  . Sulfa Antibiotics Itching    REDNESS      The results of significant diagnostics from this hospitalization (including imaging, microbiology, ancillary and laboratory) are listed below for reference.    Significant Diagnostic Studies: Ct Head Wo Contrast  11/14/2015  CLINICAL DATA:  80 year old female with a history of blurred vision and difficulty word finding. EXAM: CT HEAD WITHOUT CONTRAST TECHNIQUE: Contiguous axial images were obtained from the base of the skull through the vertex without intravenous contrast. COMPARISON:  None. FINDINGS: Unremarkable appearance of the calvarium without acute fracture or aggressive lesion. Unremarkable appearance of the scalp soft tissues. Unremarkable appearance of the bilateral orbits. Mastoid air cells are clear. No significant paranasal sinus disease No acute intracranial hemorrhage.  No midline shift or mass effect. Gray-white differentiation maintained. Unremarkable configuration of the ventricles. Calcifications of the intracranial vasculature. IMPRESSION: No  CT evidence of acute intracranial abnormality. Intracranial atherosclerotic calcifications. These results were called by telephone at the time of interpretation on 11/14/2015 at 4:12 pm to Dr. Nat Christen , who verbally acknowledged these results. Signed, Dulcy Fanny. Earleen Newport, DO [facility] Electronically Signed   By: Corrie Mckusick D.O.   On: 11/14/2015 16:16   Mr Jodene Nam Head Wo Contrast  11/14/2015  CLINICAL DATA:  Acute onset of dizziness and visual disturbance 3 hours ago. EXAM: MRI HEAD WITHOUT CONTRAST MRA HEAD WITHOUT CONTRAST TECHNIQUE: Multiplanar, multiecho pulse sequences of the brain and surrounding structures were obtained without intravenous contrast. Angiographic images of the head were obtained using  MRA technique without contrast. COMPARISON:  Head CT same day. FINDINGS: MRI HEAD FINDINGS Diffusion imaging does not show any acute or subacute infarction. The brainstem and cerebellum are normal. Within the cerebral hemispheres, there are scattered punctate foci of T2 and FLAIR signal consistent with mild chronic small-vessel change, fairly typical for age. No cortical or large vessel territory infarction. No mass lesion, hemorrhage, hydrocephalus or extra-axial collection. No pituitary mass. No inflammatory sinus disease. No skull or skullbase lesion. MRA HEAD FINDINGS Both internal carotid arteries are widely patent into the brain. No siphon stenosis. The anterior and middle cerebral vessels are normal without proximal stenosis, aneurysm or vascular malformation. Both vertebral arteries are widely patent to the basilar. No basilar stenosis. Posterior circulation branch vessels are patent. IMPRESSION: No acute or reversible finding. Mild chronic small-vessel change of the cerebral hemispheric white matter, fairly typical for age. Negative intracranial MR angiography of the large and medium size vessels. Electronically Signed   By: Nelson Chimes M.D.    On: 11/14/2015 18:20   Mr Brain Wo Contrast  11/14/2015  CLINICAL DATA:  Acute onset of dizziness and visual disturbance 3 hours ago. EXAM: MRI HEAD WITHOUT CONTRAST MRA HEAD WITHOUT CONTRAST TECHNIQUE: Multiplanar, multiecho pulse sequences of the brain and surrounding structures were obtained without intravenous contrast. Angiographic images of the head were obtained using MRA technique without contrast. COMPARISON:  Head CT same day. FINDINGS: MRI HEAD FINDINGS Diffusion imaging does not show any acute or subacute infarction. The brainstem and cerebellum are normal. Within the cerebral hemispheres, there are scattered punctate foci of T2 and FLAIR signal consistent with mild chronic small-vessel change, fairly typical for age. No cortical or large vessel territory infarction. No mass lesion, hemorrhage, hydrocephalus or extra-axial collection. No pituitary mass. No inflammatory sinus disease. No skull or skullbase lesion. MRA HEAD FINDINGS Both internal carotid arteries are widely patent into the brain. No siphon stenosis. The anterior and middle cerebral vessels are normal without proximal stenosis, aneurysm or vascular malformation. Both vertebral arteries are widely patent to the basilar. No basilar stenosis. Posterior circulation branch vessels are patent. IMPRESSION: No acute or reversible finding. Mild chronic small-vessel change of the cerebral hemispheric white matter, fairly typical for age. Negative intracranial MR angiography of the large and medium size vessels. Electronically Signed   By: Nelson Chimes M.D.   On: 11/14/2015 18:20   US Carotid Bilateral  11/15/2015  CLINICAL DATA:  Dysphagia, cephalgia, right eye visual disturbance. EXAM: BILATERAL CAROTID DUPLEX ULTRASOUND TECHNIQUE: Pearline Cables scale imaging, color Doppler and duplex ultrasound was performed of bilateral carotid and vertebral arteries in the neck. COMPARISON:  None. TECHNIQUE: Quantification of carotid stenosis is based on velocity  parameters that correlate the residual internal carotid diameter with NASCET-based stenosis levels, using the diameter of the distal internal carotid lumen as the denominator for stenosis measurement. The following velocity measurements were obtained: PEAK SYSTOLIC/END DIASTOLIC RIGHT ICA:                     76/18cm/sec CCA:                     [id number] SYSTOLIC ICA/CCA RATIO:  [id number] DIASTOLIC ICA/CCA RATIO: 1.2 ECA:                     88cm/sec LEFT ICA:                      92/26cm/sec CCA:  [id number] SYSTOLIC ICA/CCA RATIO:  [id number] DIASTOLIC ICA/CCA RATIO: [id number] ECA:                     88cm/sec FINDINGS: RIGHT CAROTID ARTERY: Mild plaque in the carotid bulb extending into the proximal ICA. No high-grade stenosis. Normal waveforms and color Doppler signal. The distal ICA is mildly tortuous. RIGHT VERTEBRAL ARTERY:  Normal flow direction and waveform. LEFT CAROTID ARTERY: Mild plaque in the carotid bulb. No high-grade stenosis. Normal waveforms and color Doppler signal. Distal ICA tortuous. LEFT VERTEBRAL ARTERY: Normal flow direction and waveform. A 2.2 cm solid left thyroid nodule is noted. IMPRESSION: 1. Mild bilateral carotid bifurcation plaque resulting in less than 50% diameter stenosis. The exam does not exclude plaque ulceration or embolization. Continued surveillance recommended. 2.  Antegrade bilateral vertebral arterial flow. 3. 2.2 cm left thyroid nodule. If clinically relevant, consider thyroid ultrasound for complete evaluation. Electronically Signed   By: Lucrezia Europe M.D.   On: 11/15/2015 11:14    Labs: Basic Metabolic Panel:  Recent Labs Lab 11/14/15 1604 11/14/15 1617  NA 132* 134*  K 4.1 5.8*  CL 103 103  CO2 23  --   GLUCOSE 103* 97  BUN 22* 31*  CREATININE 1.18* 1.10*  CALCIUM 8.6*  --    Liver Function Tests:  Recent Labs Lab 11/14/15 1604  AST 16  ALT 13*  ALKPHOS 59  BILITOT 1.0  PROT 6.7  ALBUMIN 3.8   CBC:  Recent Labs Lab  11/14/15 1604 11/14/15 1617  WBC 6.6  --   NEUTROABS 4.5  --   HGB 14.8 15.3*  HCT 43.3 45.0  MCV 102.4*  --   PLT 184  --     SignedOrvan Falconer MD.  [facility] 11/15/2015, 5:04 PM

## 2015-11-17 LAB — HOMOCYSTEINE: Homocysteine: 15.6 umol/L — ABNORMAL HIGH (ref 0.0–15.0)

## 2015-11-17 LAB — HEMOGLOBIN A1C
Hgb A1c MFr Bld: 5.3 % (ref 4.8–5.6)
MEAN PLASMA GLUCOSE: 105 mg/dL

## 2015-11-20 DIAGNOSIS — E041 Nontoxic single thyroid nodule: Secondary | ICD-10-CM | POA: Diagnosis not present

## 2015-11-20 DIAGNOSIS — K219 Gastro-esophageal reflux disease without esophagitis: Secondary | ICD-10-CM | POA: Diagnosis not present

## 2015-11-20 DIAGNOSIS — Z6823 Body mass index (BMI) 23.0-23.9, adult: Secondary | ICD-10-CM | POA: Diagnosis not present

## 2015-11-20 DIAGNOSIS — G459 Transient cerebral ischemic attack, unspecified: Secondary | ICD-10-CM | POA: Diagnosis not present

## 2015-11-20 DIAGNOSIS — I10 Essential (primary) hypertension: Secondary | ICD-10-CM | POA: Diagnosis not present

## 2015-12-02 DIAGNOSIS — Z1389 Encounter for screening for other disorder: Secondary | ICD-10-CM | POA: Diagnosis not present

## 2015-12-02 DIAGNOSIS — R197 Diarrhea, unspecified: Secondary | ICD-10-CM | POA: Diagnosis not present

## 2015-12-02 DIAGNOSIS — Z6823 Body mass index (BMI) 23.0-23.9, adult: Secondary | ICD-10-CM | POA: Diagnosis not present

## 2015-12-02 DIAGNOSIS — R5383 Other fatigue: Secondary | ICD-10-CM | POA: Diagnosis not present

## 2015-12-02 DIAGNOSIS — D692 Other nonthrombocytopenic purpura: Secondary | ICD-10-CM | POA: Diagnosis not present

## 2015-12-02 DIAGNOSIS — N183 Chronic kidney disease, stage 3 (moderate): Secondary | ICD-10-CM | POA: Diagnosis not present

## 2015-12-29 DIAGNOSIS — H9222 Otorrhagia, left ear: Secondary | ICD-10-CM | POA: Diagnosis not present

## 2015-12-29 DIAGNOSIS — Z6822 Body mass index (BMI) 22.0-22.9, adult: Secondary | ICD-10-CM | POA: Diagnosis not present

## 2015-12-30 DIAGNOSIS — E782 Mixed hyperlipidemia: Secondary | ICD-10-CM | POA: Diagnosis not present

## 2015-12-30 DIAGNOSIS — Z1389 Encounter for screening for other disorder: Secondary | ICD-10-CM | POA: Diagnosis not present

## 2015-12-30 DIAGNOSIS — Z6822 Body mass index (BMI) 22.0-22.9, adult: Secondary | ICD-10-CM | POA: Diagnosis not present

## 2015-12-30 DIAGNOSIS — Z0000 Encounter for general adult medical examination without abnormal findings: Secondary | ICD-10-CM | POA: Diagnosis not present

## 2016-01-02 DIAGNOSIS — H66012 Acute suppurative otitis media with spontaneous rupture of ear drum, left ear: Secondary | ICD-10-CM | POA: Diagnosis not present

## 2016-01-06 DIAGNOSIS — H00022 Hordeolum internum right lower eyelid: Secondary | ICD-10-CM | POA: Diagnosis not present

## 2016-01-08 ENCOUNTER — Ambulatory Visit (INDEPENDENT_AMBULATORY_CARE_PROVIDER_SITE_OTHER): Payer: PPO | Admitting: Otolaryngology

## 2016-01-08 DIAGNOSIS — H6122 Impacted cerumen, left ear: Secondary | ICD-10-CM

## 2016-01-08 DIAGNOSIS — H9012 Conductive hearing loss, unilateral, left ear, with unrestricted hearing on the contralateral side: Secondary | ICD-10-CM

## 2016-01-13 DIAGNOSIS — Z6823 Body mass index (BMI) 23.0-23.9, adult: Secondary | ICD-10-CM | POA: Diagnosis not present

## 2016-01-13 DIAGNOSIS — I10 Essential (primary) hypertension: Secondary | ICD-10-CM | POA: Diagnosis not present

## 2016-01-13 DIAGNOSIS — J209 Acute bronchitis, unspecified: Secondary | ICD-10-CM | POA: Diagnosis not present

## 2016-01-13 DIAGNOSIS — R0789 Other chest pain: Secondary | ICD-10-CM | POA: Diagnosis not present

## 2016-01-13 DIAGNOSIS — J018 Other acute sinusitis: Secondary | ICD-10-CM | POA: Diagnosis not present

## 2016-01-13 DIAGNOSIS — K219 Gastro-esophageal reflux disease without esophagitis: Secondary | ICD-10-CM | POA: Diagnosis not present

## 2016-01-13 DIAGNOSIS — Z1389 Encounter for screening for other disorder: Secondary | ICD-10-CM | POA: Diagnosis not present

## 2016-01-27 DIAGNOSIS — H00022 Hordeolum internum right lower eyelid: Secondary | ICD-10-CM | POA: Diagnosis not present

## 2016-04-01 DIAGNOSIS — R600 Localized edema: Secondary | ICD-10-CM | POA: Diagnosis not present

## 2016-04-01 DIAGNOSIS — L659 Nonscarring hair loss, unspecified: Secondary | ICD-10-CM | POA: Diagnosis not present

## 2016-04-01 DIAGNOSIS — I872 Venous insufficiency (chronic) (peripheral): Secondary | ICD-10-CM | POA: Diagnosis not present

## 2016-04-01 DIAGNOSIS — E782 Mixed hyperlipidemia: Secondary | ICD-10-CM | POA: Diagnosis not present

## 2016-04-01 DIAGNOSIS — Z6823 Body mass index (BMI) 23.0-23.9, adult: Secondary | ICD-10-CM | POA: Diagnosis not present

## 2016-04-01 DIAGNOSIS — E063 Autoimmune thyroiditis: Secondary | ICD-10-CM | POA: Diagnosis not present

## 2016-04-01 DIAGNOSIS — I77819 Aortic ectasia, unspecified site: Secondary | ICD-10-CM | POA: Diagnosis not present

## 2016-04-09 DIAGNOSIS — Z6823 Body mass index (BMI) 23.0-23.9, adult: Secondary | ICD-10-CM | POA: Diagnosis not present

## 2016-04-09 DIAGNOSIS — M1000 Idiopathic gout, unspecified site: Secondary | ICD-10-CM | POA: Diagnosis not present

## 2016-04-09 DIAGNOSIS — M1991 Primary osteoarthritis, unspecified site: Secondary | ICD-10-CM | POA: Diagnosis not present

## 2016-04-09 DIAGNOSIS — R600 Localized edema: Secondary | ICD-10-CM | POA: Diagnosis not present

## 2016-04-19 DIAGNOSIS — N2581 Secondary hyperparathyroidism of renal origin: Secondary | ICD-10-CM | POA: Diagnosis not present

## 2016-04-19 DIAGNOSIS — N183 Chronic kidney disease, stage 3 (moderate): Secondary | ICD-10-CM | POA: Diagnosis not present

## 2016-04-19 DIAGNOSIS — N189 Chronic kidney disease, unspecified: Secondary | ICD-10-CM | POA: Diagnosis not present

## 2016-04-22 DIAGNOSIS — J029 Acute pharyngitis, unspecified: Secondary | ICD-10-CM | POA: Diagnosis not present

## 2016-04-22 DIAGNOSIS — Z6823 Body mass index (BMI) 23.0-23.9, adult: Secondary | ICD-10-CM | POA: Diagnosis not present

## 2016-04-22 DIAGNOSIS — J069 Acute upper respiratory infection, unspecified: Secondary | ICD-10-CM | POA: Diagnosis not present

## 2016-04-23 DIAGNOSIS — D631 Anemia in chronic kidney disease: Secondary | ICD-10-CM | POA: Diagnosis not present

## 2016-04-23 DIAGNOSIS — N2581 Secondary hyperparathyroidism of renal origin: Secondary | ICD-10-CM | POA: Diagnosis not present

## 2016-04-23 DIAGNOSIS — I129 Hypertensive chronic kidney disease with stage 1 through stage 4 chronic kidney disease, or unspecified chronic kidney disease: Secondary | ICD-10-CM | POA: Diagnosis not present

## 2016-04-23 DIAGNOSIS — N183 Chronic kidney disease, stage 3 (moderate): Secondary | ICD-10-CM | POA: Diagnosis not present

## 2016-04-27 DIAGNOSIS — R2241 Localized swelling, mass and lump, right lower limb: Secondary | ICD-10-CM | POA: Diagnosis not present

## 2016-04-27 DIAGNOSIS — Z23 Encounter for immunization: Secondary | ICD-10-CM | POA: Diagnosis not present

## 2016-04-27 DIAGNOSIS — M25552 Pain in left hip: Secondary | ICD-10-CM | POA: Diagnosis not present

## 2016-04-27 DIAGNOSIS — Z1389 Encounter for screening for other disorder: Secondary | ICD-10-CM | POA: Diagnosis not present

## 2016-04-27 DIAGNOSIS — Z6823 Body mass index (BMI) 23.0-23.9, adult: Secondary | ICD-10-CM | POA: Diagnosis not present

## 2016-04-28 ENCOUNTER — Ambulatory Visit (HOSPITAL_COMMUNITY): Payer: PPO

## 2016-04-28 ENCOUNTER — Other Ambulatory Visit (HOSPITAL_COMMUNITY): Payer: Self-pay | Admitting: Internal Medicine

## 2016-04-28 DIAGNOSIS — E041 Nontoxic single thyroid nodule: Secondary | ICD-10-CM

## 2016-05-03 DIAGNOSIS — E559 Vitamin D deficiency, unspecified: Secondary | ICD-10-CM | POA: Diagnosis not present

## 2016-05-03 DIAGNOSIS — M25551 Pain in right hip: Secondary | ICD-10-CM | POA: Diagnosis not present

## 2016-05-03 DIAGNOSIS — M109 Gout, unspecified: Secondary | ICD-10-CM | POA: Diagnosis not present

## 2016-05-06 ENCOUNTER — Ambulatory Visit (HOSPITAL_COMMUNITY): Payer: PPO

## 2016-05-19 DIAGNOSIS — X32XXXD Exposure to sunlight, subsequent encounter: Secondary | ICD-10-CM | POA: Diagnosis not present

## 2016-05-19 DIAGNOSIS — L218 Other seborrheic dermatitis: Secondary | ICD-10-CM | POA: Diagnosis not present

## 2016-05-19 DIAGNOSIS — L820 Inflamed seborrheic keratosis: Secondary | ICD-10-CM | POA: Diagnosis not present

## 2016-05-19 DIAGNOSIS — L570 Actinic keratosis: Secondary | ICD-10-CM | POA: Diagnosis not present

## 2016-06-08 DIAGNOSIS — S9032XA Contusion of left foot, initial encounter: Secondary | ICD-10-CM | POA: Diagnosis not present

## 2016-06-17 DIAGNOSIS — E748 Other specified disorders of carbohydrate metabolism: Secondary | ICD-10-CM | POA: Diagnosis not present

## 2016-06-17 DIAGNOSIS — Z0001 Encounter for general adult medical examination with abnormal findings: Secondary | ICD-10-CM | POA: Diagnosis not present

## 2016-06-17 DIAGNOSIS — I77810 Thoracic aortic ectasia: Secondary | ICD-10-CM | POA: Diagnosis not present

## 2016-06-17 DIAGNOSIS — E041 Nontoxic single thyroid nodule: Secondary | ICD-10-CM | POA: Diagnosis not present

## 2016-06-17 DIAGNOSIS — K219 Gastro-esophageal reflux disease without esophagitis: Secondary | ICD-10-CM | POA: Diagnosis not present

## 2016-06-17 DIAGNOSIS — E782 Mixed hyperlipidemia: Secondary | ICD-10-CM | POA: Diagnosis not present

## 2016-06-17 DIAGNOSIS — M1991 Primary osteoarthritis, unspecified site: Secondary | ICD-10-CM | POA: Diagnosis not present

## 2016-06-17 DIAGNOSIS — M4180 Other forms of scoliosis, site unspecified: Secondary | ICD-10-CM | POA: Diagnosis not present

## 2016-06-17 DIAGNOSIS — I10 Essential (primary) hypertension: Secondary | ICD-10-CM | POA: Diagnosis not present

## 2016-06-17 DIAGNOSIS — Z1389 Encounter for screening for other disorder: Secondary | ICD-10-CM | POA: Diagnosis not present

## 2016-06-17 DIAGNOSIS — Z6824 Body mass index (BMI) 24.0-24.9, adult: Secondary | ICD-10-CM | POA: Diagnosis not present

## 2016-06-24 ENCOUNTER — Ambulatory Visit (HOSPITAL_COMMUNITY): Admission: RE | Admit: 2016-06-24 | Payer: PPO | Source: Ambulatory Visit

## 2016-06-28 ENCOUNTER — Other Ambulatory Visit (HOSPITAL_COMMUNITY): Payer: Self-pay | Admitting: Internal Medicine

## 2016-06-28 ENCOUNTER — Ambulatory Visit (HOSPITAL_COMMUNITY)
Admission: RE | Admit: 2016-06-28 | Discharge: 2016-06-28 | Disposition: A | Discharge status: Home / Self Care | Payer: PPO | Source: Ambulatory Visit | Attending: Internal Medicine | Admitting: Internal Medicine

## 2016-06-28 DIAGNOSIS — Z1231 Encounter for screening mammogram for malignant neoplasm of breast: Secondary | ICD-10-CM

## 2016-06-28 DIAGNOSIS — E041 Nontoxic single thyroid nodule: Secondary | ICD-10-CM | POA: Diagnosis not present

## 2016-07-01 DIAGNOSIS — E748 Other specified disorders of carbohydrate metabolism: Secondary | ICD-10-CM | POA: Diagnosis not present

## 2016-07-02 ENCOUNTER — Other Ambulatory Visit (HOSPITAL_COMMUNITY): Payer: Self-pay | Admitting: Family Medicine

## 2016-07-02 DIAGNOSIS — R609 Edema, unspecified: Secondary | ICD-10-CM

## 2016-07-02 DIAGNOSIS — R2241 Localized swelling, mass and lump, right lower limb: Secondary | ICD-10-CM

## 2016-07-05 ENCOUNTER — Ambulatory Visit (HOSPITAL_COMMUNITY)
Admission: RE | Admit: 2016-07-05 | Discharge: 2016-07-05 | Disposition: A | Discharge status: Home / Self Care | Payer: PPO | Source: Ambulatory Visit | Attending: Internal Medicine | Admitting: Internal Medicine

## 2016-07-05 DIAGNOSIS — Z1231 Encounter for screening mammogram for malignant neoplasm of breast: Secondary | ICD-10-CM | POA: Diagnosis not present

## 2016-07-20 DIAGNOSIS — T560X1A Toxic effect of lead and its compounds, accidental (unintentional), initial encounter: Secondary | ICD-10-CM | POA: Diagnosis not present

## 2016-08-03 DIAGNOSIS — Z6823 Body mass index (BMI) 23.0-23.9, adult: Secondary | ICD-10-CM | POA: Diagnosis not present

## 2016-08-03 DIAGNOSIS — I10 Essential (primary) hypertension: Secondary | ICD-10-CM | POA: Diagnosis not present

## 2016-08-03 DIAGNOSIS — M109 Gout, unspecified: Secondary | ICD-10-CM | POA: Diagnosis not present

## 2016-08-03 DIAGNOSIS — E782 Mixed hyperlipidemia: Secondary | ICD-10-CM | POA: Diagnosis not present

## 2016-08-18 DIAGNOSIS — H61001 Unspecified perichondritis of right external ear: Secondary | ICD-10-CM | POA: Diagnosis not present

## 2016-08-18 DIAGNOSIS — C44319 Basal cell carcinoma of skin of other parts of face: Secondary | ICD-10-CM | POA: Diagnosis not present

## 2016-09-02 ENCOUNTER — Encounter: Payer: Self-pay | Admitting: *Deleted

## 2016-09-02 NOTE — Progress Notes (Signed)
Cardiology Office Note  Date: 09/03/2016   ID: Allison Morris, DOB [birth year], MRN [id number]  PCP: Glo Herring, MD  Primary Cardiologist: Rozann Lesches, MD   Chief Complaint  Patient presents with  . Aortic root dilatation    History of Present Illness: Allison Morris is a 81 y.o. female last seen in March 2017. Interval records were reviewed, she was admitted in June of last year with a TIA, treated with aspirin and Plavix as well as statin therapy. No cardiac arrhythmias were documented at that time. She states that she has had no further symptoms (it was mainly visual changes). No chest pain or palpitations. She states that she has not been able to tolerate any statin medications, was tried on something else which she cannot recall but did not tolerate it either. She is now taking omega-3 supplements and continues to see Dr. Gerarda Fraction.  Echocardiogram from June 2017 is outlined below. She has a history of mild aortic root dilatation, 43 mm by the most recent study.  I personally reviewed her ECG today which shows sinus bradycardia with low voltage in the limb leads.  Past Medical History:  Diagnosis Date  . Aortic dilatation (HCC)   . Aortic regurgitation   . Essential hypertension   . History of DVT (deep vein thrombosis)    Left leg 1997   . Hyperlipemia     Past Surgical History:  Procedure Laterality Date  . APPENDECTOMY    . CARPAL TUNNEL RELEASE    . TONSILLECTOMY      Current Outpatient Prescriptions  Medication Sig Dispense Refill  . acetaminophen (TYLENOL) 500 MG tablet Take 500 mg by mouth every 6 (six) hours as needed for mild pain or moderate pain.     Marland Kitchen aspirin EC 81 MG EC tablet Take 1 tablet (81 mg total) by mouth daily. 100 tablet 1  . Cholecalciferol (VITAMIN D3) 1000 units CAPS Take 1 capsule by mouth daily.     . clobetasol cream (TEMOVATE) 2.09 % Apply 1 application topically daily as needed (for irritation).     . clopidogrel (PLAVIX) 75 MG  tablet Take 1 tablet (75 mg total) by mouth daily. 30 tablet 2  . fexofenadine (ALLEGRA) 180 MG tablet Take 180 mg by mouth daily.    . fluticasone (FLONASE) 50 MCG/ACT nasal spray Place 1 spray into both nostrils daily.     . Glucosamine-Chondroit-Vit C-Mn (GLUCOSAMINE 1500 COMPLEX PO) Take 1 tablet by mouth daily.     . nebivolol (BYSTOLIC) 5 MG tablet Take 5 mg by mouth 2 (two) times daily.     . Omega-3 Fatty Acids (FISH OIL) 1200 MG CAPS Take 1 capsule by mouth 2 (two) times daily.     . pantoprazole (PROTONIX) 40 MG tablet Take 40 mg by mouth daily.      No current facility-administered medications for this visit.    Allergies:  Biaxin [clarithromycin]; Doxycycline; Estrogens; Macrodantin [nitrofurantoin]; Statins; Ceftin [cefuroxime]; Ciprofloxacin; and Sulfa antibiotics   Social History: The patient  reports that she has never smoked. She has never used smokeless tobacco. She reports that she does not drink alcohol or use drugs.   ROS:  Please see the history of present illness. Otherwise, complete review of systems is positive for none.  All other systems are reviewed and negative.   Physical Exam: VS:  BP (!) 147/77 (BP Location: Left Arm, Patient Position: Sitting, Cuff Size: Normal)   Pulse (!) 49   Ht 5'  6" (1.676 m)   Wt 135 lb (61.2 kg)   SpO2 96%   BMI 21.79 kg/m , BMI Body mass index is 21.79 kg/m.  Wt Readings from Last 3 Encounters:  09/03/16 135 lb (61.2 kg)  11/14/15 130 lb 9.6 oz (59.2 kg)  08/22/15 132 lb (59.9 kg)    General: Patient appears comfortable at rest. HEENT: Conjunctiva and lids normal, oropharynx clear. Neck: Supple, no elevated JVP or carotid bruits, no thyromegaly. Lungs: Clear to auscultation, nonlabored breathing at rest. Cardiac: Regular rate and rhythm, no S3, soft right basal systolic murmur, no diastolic murmur, no pericardial rub. Abdomen: Soft, nontender, bowel sounds present, no guarding or rebound. Extremities: No pitting edema,  distal pulses 2+. Skin: Warm and dry. Musculoskeletal: No kyphosis. Neuropsychiatric: Alert and oriented 3, affect appropriate.  ECG: I personally reviewed the tracing from 11/14/2015 which showed normal sinus rhythm.  Recent Labwork: 11/14/2015: ALT 13; AST 16; BUN 31; Creatinine, Ser 1.10; Hemoglobin 15.3; Platelets 184; Potassium 5.8; Sodium 134     Component Value Date/Time   CHOL 248 (H) 11/15/2015 0542   TRIG 87 11/15/2015 0542   HDL 48 11/15/2015 0542   CHOLHDL 5.2 11/15/2015 0542   VLDL 17 11/15/2015 0542   LDLCALC 183 (H) 11/15/2015 0542    Other Studies Reviewed Today:  Echocardiogram 11/15/2015: Study Conclusions  - Left ventricle: The cavity size was normal. There was moderate   focal basal hypertrophy of the septum. Systolic function was   normal. The estimated ejection fraction was in the range of 60%   to 65%. Wall motion was normal; there were no regional wall   motion abnormalities. Features are consistent with a pseudonormal   left ventricular filling pattern, with concomitant abnormal   relaxation and increased filling pressure (grade 2 diastolic   dysfunction). Doppler parameters are consistent with high   ventricular filling pressure. - Aortic valve: Trileaflet; normal thickness, mildly calcified   leaflets. There was mild regurgitation. Valve area (VTI): 2.71   cm^2. Valve area (Vmax): 2.72 cm^2. Valve area (Vmean): 2.59   cm^2. - Aorta: Ascending aortic diameter: 43 mm (S). - Ascending aorta: The ascending aorta was mildly dilated. - Mitral valve: Calcified annulus. - Tricuspid valve: There was trivial regurgitation.  Carotid Dopplers 11/15/2015: IMPRESSION: 1. Mild bilateral carotid bifurcation plaque resulting in less than 50% diameter stenosis. The exam does not exclude plaque ulceration or embolization. Continued surveillance recommended. 2.  Antegrade bilateral vertebral arterial flow. 3. 2.2 cm left thyroid nodule. If clinically relevant,  consider thyroid ultrasound for complete evaluation.  Assessment and Plan:  1. Mild aortic root dilatation, 43 mm by echocardiogram last summer. She is asymptomatic. We will plan to see her back in a year with a repeat study.  2. Essential hypertension. Patient reports that her blood pressure does fluctuate. She checks it at home. Currently on Bystolic and she is bradycardic at baseline, so I doubt that further up titration will necessarily be the best choice. May need to consider adding a different agent such as ARB. Recommended that she keep track of her blood pressure and follow-up with Dr. Gerarda Fraction.  3. Hyperlipidemia with statin intolerance. She follows with Dr. Gerarda Fraction.  4. Aortic regurgitation, mild by last echocardiogram and asymptomatic.  Current medicines were reviewed with the patient today.   Orders Placed This Encounter  Procedures  . EKG 12-Lead  . ECHOCARDIOGRAM COMPLETE    Disposition: Follow-up in one year.  Signed, Satira Sark, MD, [facility] 09/03/2016 11:45 AM  [facility] [place], [place], [place] [address] Phone: [phone]; Fax: [phone]

## 2016-09-03 ENCOUNTER — Encounter: Payer: Self-pay | Admitting: Cardiology

## 2016-09-03 ENCOUNTER — Ambulatory Visit (INDEPENDENT_AMBULATORY_CARE_PROVIDER_SITE_OTHER): Payer: PPO | Admitting: Cardiology

## 2016-09-03 VITALS — BP 147/77 | HR 49 | Ht 66.0 in | Wt 135.0 lb

## 2016-09-03 DIAGNOSIS — I77819 Aortic ectasia, unspecified site: Secondary | ICD-10-CM

## 2016-09-03 DIAGNOSIS — Z789 Other specified health status: Secondary | ICD-10-CM | POA: Diagnosis not present

## 2016-09-03 DIAGNOSIS — I351 Nonrheumatic aortic (valve) insufficiency: Secondary | ICD-10-CM

## 2016-09-03 DIAGNOSIS — I10 Essential (primary) hypertension: Secondary | ICD-10-CM | POA: Diagnosis not present

## 2016-09-03 NOTE — Patient Instructions (Signed)
Medication Instructions:  Continue all current medications.  Labwork: none  Testing/Procedures: Your physician has requested that you have an echocardiogram. Echocardiography is a painless test that uses sound waves to create images of your heart. It provides your doctor with information about the size and shape of your heart and how well your heart's chambers and valves are working. This procedure takes approximately one hour. There are no restrictions for this procedure -  DUE JUST PRIOR TO NEXT OFFICE VISIT   Follow-Up: Your physician wants you to follow up in:  1 year.  You will receive a reminder letter in the mail one-two months in advance.  If you don't receive a letter, please call our office to schedule the follow up appointment.    Any Other Special Instructions Will Be Listed Below (If Applicable).  If you need a refill on your cardiac medications before your next appointment, please call your pharmacy.  

## 2016-09-21 DIAGNOSIS — H61001 Unspecified perichondritis of right external ear: Secondary | ICD-10-CM | POA: Diagnosis not present

## 2016-09-21 DIAGNOSIS — Z08 Encounter for follow-up examination after completed treatment for malignant neoplasm: Secondary | ICD-10-CM | POA: Diagnosis not present

## 2016-09-21 DIAGNOSIS — Z85828 Personal history of other malignant neoplasm of skin: Secondary | ICD-10-CM | POA: Diagnosis not present

## 2016-09-23 DIAGNOSIS — I10 Essential (primary) hypertension: Secondary | ICD-10-CM | POA: Diagnosis not present

## 2016-09-23 DIAGNOSIS — R001 Bradycardia, unspecified: Secondary | ICD-10-CM | POA: Diagnosis not present

## 2016-09-23 DIAGNOSIS — Z1389 Encounter for screening for other disorder: Secondary | ICD-10-CM | POA: Diagnosis not present

## 2016-09-23 DIAGNOSIS — Z6823 Body mass index (BMI) 23.0-23.9, adult: Secondary | ICD-10-CM | POA: Diagnosis not present

## 2016-10-04 DIAGNOSIS — M79671 Pain in right foot: Secondary | ICD-10-CM | POA: Diagnosis not present

## 2016-10-04 DIAGNOSIS — T560X1A Toxic effect of lead and its compounds, accidental (unintentional), initial encounter: Secondary | ICD-10-CM | POA: Diagnosis not present

## 2016-10-06 IMAGING — US US BREAST LTD UNI RIGHT INC AXILLA
1 series · 5 of 5 positions shown · non-contrast
Comparison: Previous exam(s).

CLINICAL DATA: Screening recall for possible right breast mass.

EXAM:
DIGITAL DIAGNOSTIC RIGHT MAMMOGRAM WITH 3D TOMOSYNTHESIS AND CAD
RIGHT BREAST ULTRASOUND

[Series 1: us breast ltd uni right inc axilla · 0.07mm/px · 5 of 5 slices shown]
[im 1/5]
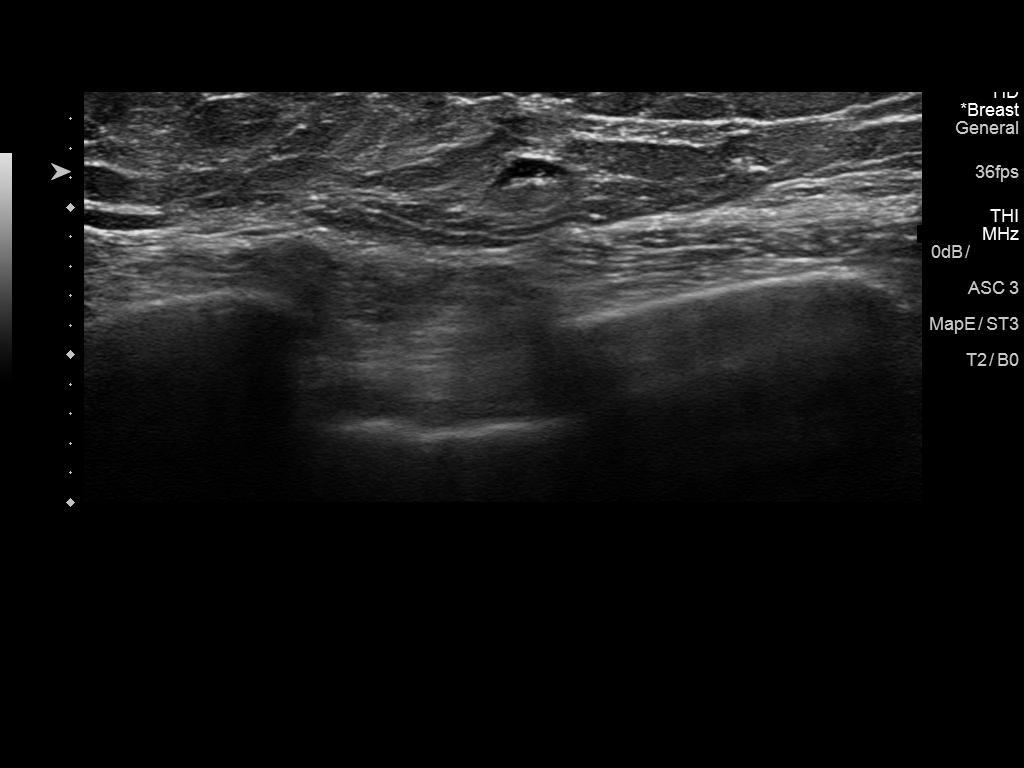
[im 2/5]
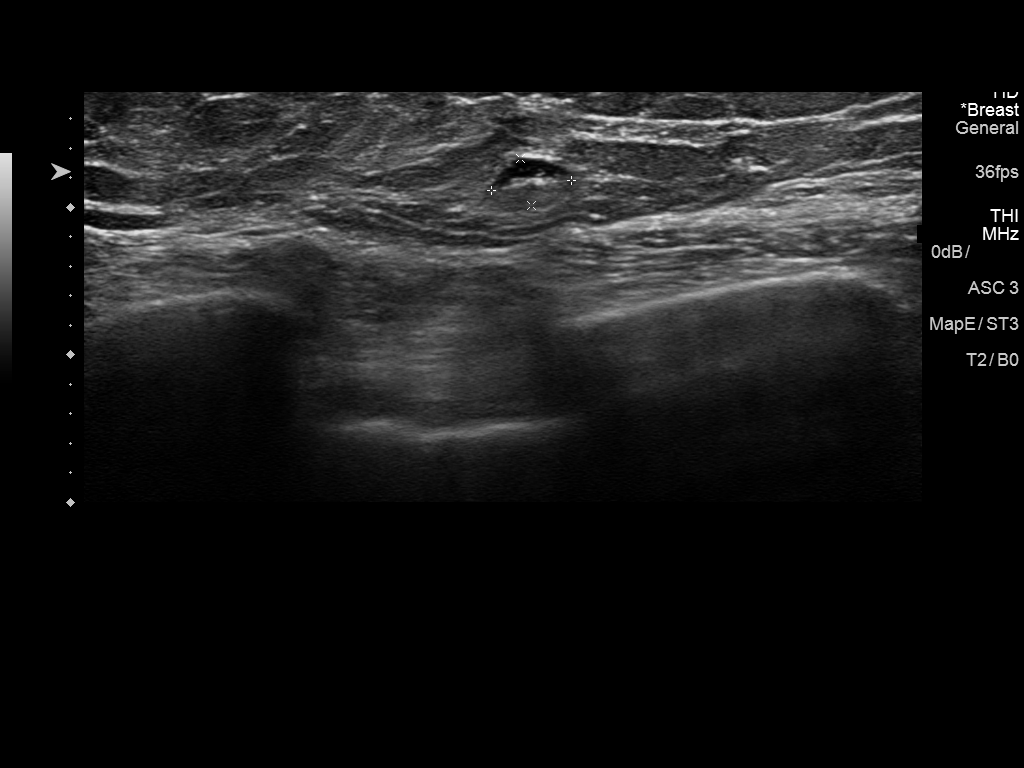
[im 3/5]
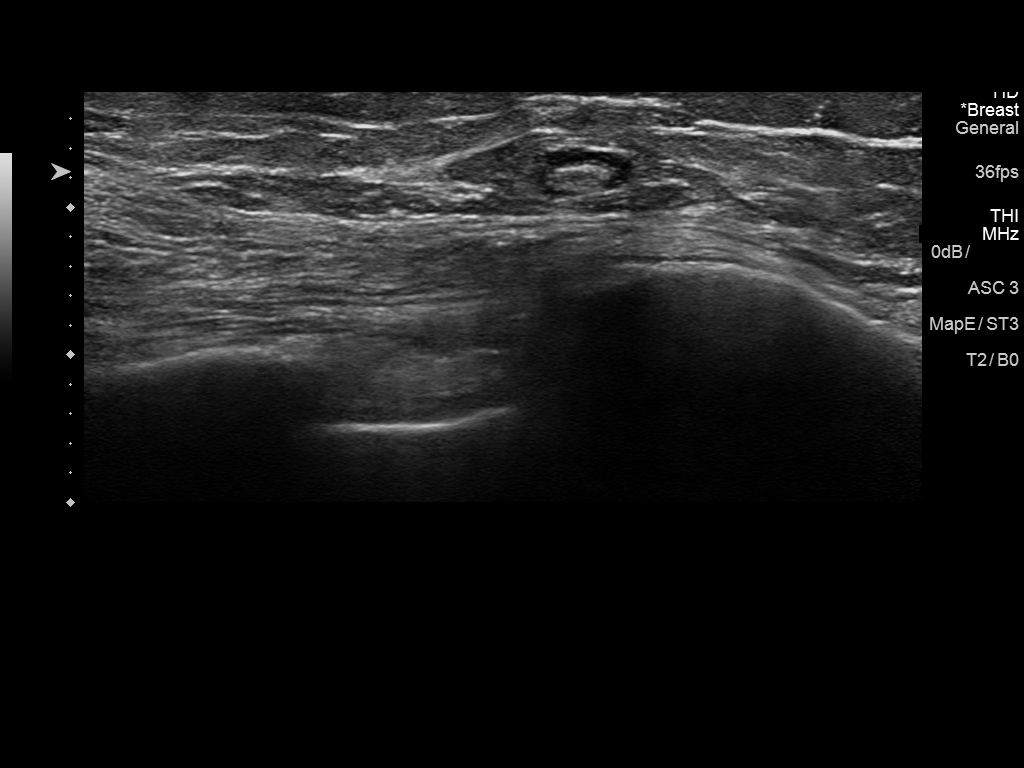
[im 4/5]
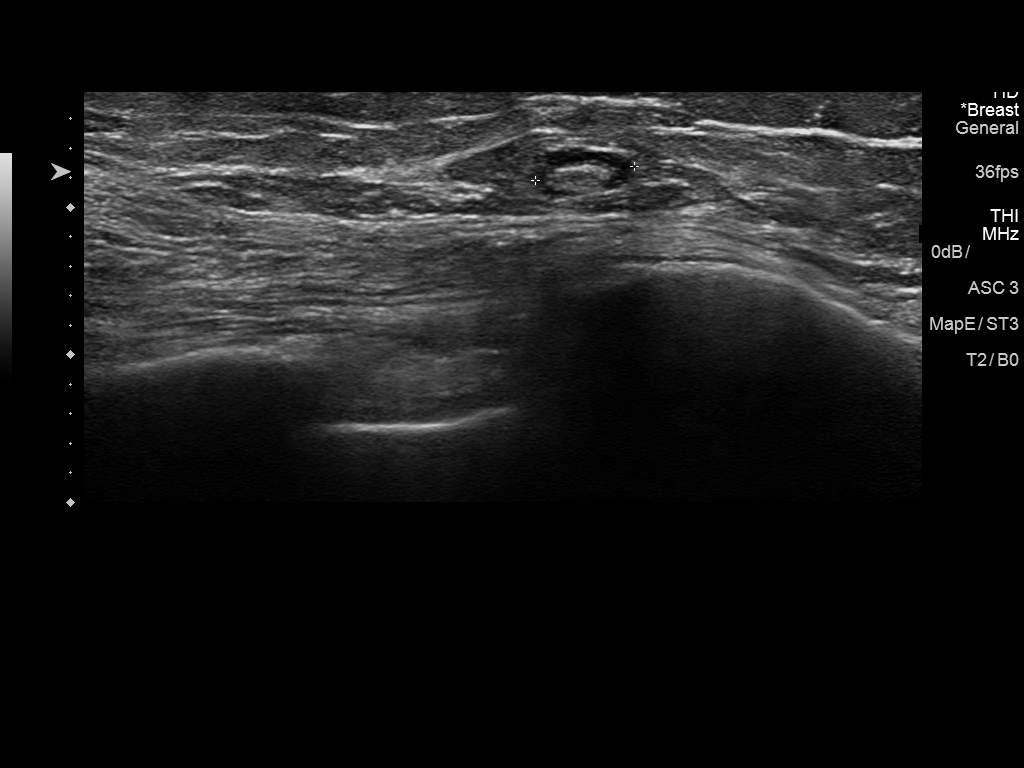
[im 5/5]
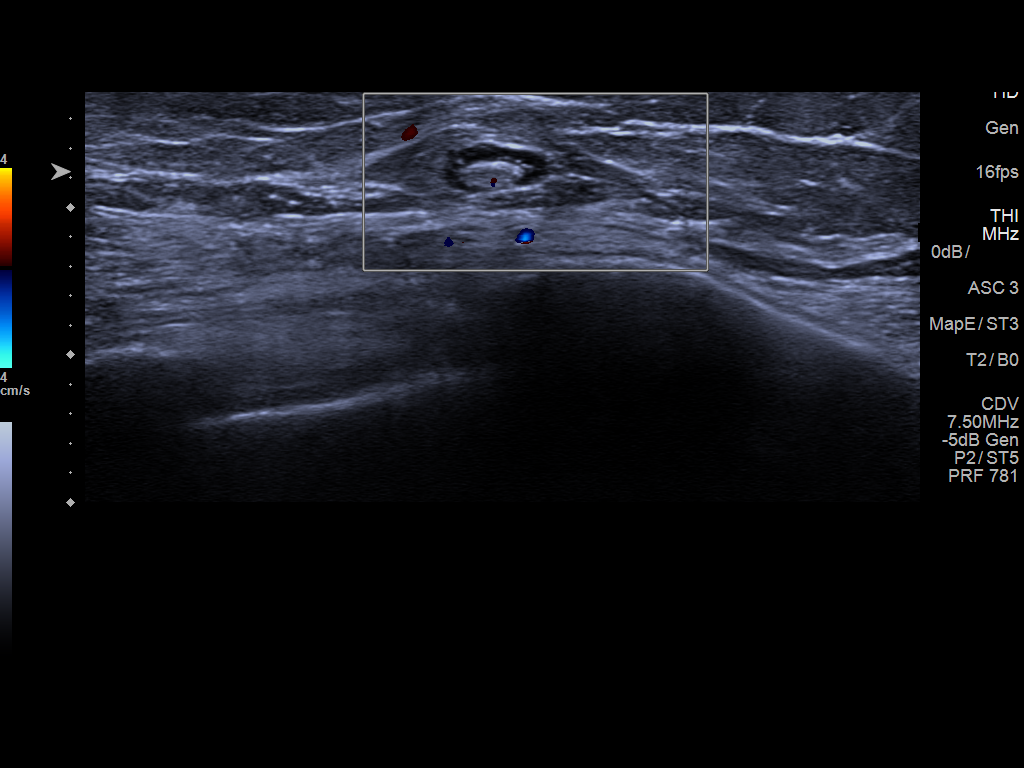

[5 of 5 positions shown; findings below may reference images not displayed]

ACR Breast Density Category b: There are scattered areas of
fibroglandular density.
FINDINGS: Spot compression CC and MLO tomosynthesis was performed of the right
breast. The initially questioned right breast mass is not apparent
on the additional imaging, however appears to be oval with partially
obscured margins, and measuring 0.8-0.9 cm on the initial CC
tomosynthesis.

Mammographic images were processed with CAD.

Physical examination of the outer right breast does not reveal any
palpable masses.

Targeted ultrasound of the right breast was performed demonstrating
a benign appearing intramammary lymph node at 9 o'clock 8 cm from
the nipple measuring 0.7 x 0.3 x 0.7 cm. This corresponds with
mammography findings. The entire outer right breast was scanned with
no suspicious abnormalities seen.
IMPRESSION: Benign appearing intramammary lymph node in the right breast. There
is no mammographic evidence of malignancy in the right breast.

RECOMMENDATION:
Screening mammogram in one year.(Code:RO-[id number])

I have discussed the findings and recommendations with the patient.
Results were also provided in writing at the conclusion of the
visit. If applicable, a reminder letter will be sent to the patient
regarding the next appointment.

BI-RADS CATEGORY  2: Benign.

## 2016-10-08 DIAGNOSIS — T560X1D Toxic effect of lead and its compounds, accidental (unintentional), subsequent encounter: Secondary | ICD-10-CM | POA: Diagnosis not present

## 2016-10-08 DIAGNOSIS — M79671 Pain in right foot: Secondary | ICD-10-CM | POA: Diagnosis not present

## 2016-10-12 DIAGNOSIS — M1000 Idiopathic gout, unspecified site: Secondary | ICD-10-CM | POA: Diagnosis not present

## 2016-10-12 DIAGNOSIS — Z6823 Body mass index (BMI) 23.0-23.9, adult: Secondary | ICD-10-CM | POA: Diagnosis not present

## 2016-10-12 DIAGNOSIS — Z1389 Encounter for screening for other disorder: Secondary | ICD-10-CM | POA: Diagnosis not present

## 2016-10-14 DIAGNOSIS — M810 Age-related osteoporosis without current pathological fracture: Secondary | ICD-10-CM | POA: Diagnosis not present

## 2016-10-14 DIAGNOSIS — I5189 Other ill-defined heart diseases: Secondary | ICD-10-CM | POA: Diagnosis not present

## 2016-10-14 DIAGNOSIS — I77819 Aortic ectasia, unspecified site: Secondary | ICD-10-CM | POA: Diagnosis not present

## 2016-10-14 DIAGNOSIS — M109 Gout, unspecified: Secondary | ICD-10-CM | POA: Diagnosis not present

## 2016-10-14 DIAGNOSIS — G459 Transient cerebral ischemic attack, unspecified: Secondary | ICD-10-CM | POA: Diagnosis not present

## 2016-10-14 DIAGNOSIS — N289 Disorder of kidney and ureter, unspecified: Secondary | ICD-10-CM | POA: Diagnosis not present

## 2016-10-14 DIAGNOSIS — Z1389 Encounter for screening for other disorder: Secondary | ICD-10-CM | POA: Diagnosis not present

## 2016-10-14 DIAGNOSIS — I10 Essential (primary) hypertension: Secondary | ICD-10-CM | POA: Diagnosis not present

## 2016-10-14 DIAGNOSIS — Z6822 Body mass index (BMI) 22.0-22.9, adult: Secondary | ICD-10-CM | POA: Diagnosis not present

## 2016-10-14 DIAGNOSIS — I779 Disorder of arteries and arterioles, unspecified: Secondary | ICD-10-CM | POA: Diagnosis not present

## 2016-10-14 DIAGNOSIS — E784 Other hyperlipidemia: Secondary | ICD-10-CM | POA: Diagnosis not present

## 2016-10-14 DIAGNOSIS — E042 Nontoxic multinodular goiter: Secondary | ICD-10-CM | POA: Diagnosis not present

## 2016-10-22 DIAGNOSIS — I10 Essential (primary) hypertension: Secondary | ICD-10-CM | POA: Diagnosis not present

## 2016-10-22 DIAGNOSIS — Z6823 Body mass index (BMI) 23.0-23.9, adult: Secondary | ICD-10-CM | POA: Diagnosis not present

## 2016-10-22 DIAGNOSIS — Z1389 Encounter for screening for other disorder: Secondary | ICD-10-CM | POA: Diagnosis not present

## 2016-10-22 DIAGNOSIS — N2581 Secondary hyperparathyroidism of renal origin: Secondary | ICD-10-CM | POA: Diagnosis not present

## 2016-10-22 DIAGNOSIS — N183 Chronic kidney disease, stage 3 (moderate): Secondary | ICD-10-CM | POA: Diagnosis not present

## 2016-10-22 DIAGNOSIS — N189 Chronic kidney disease, unspecified: Secondary | ICD-10-CM | POA: Diagnosis not present

## 2016-10-28 DIAGNOSIS — D631 Anemia in chronic kidney disease: Secondary | ICD-10-CM | POA: Diagnosis not present

## 2016-10-28 DIAGNOSIS — I129 Hypertensive chronic kidney disease with stage 1 through stage 4 chronic kidney disease, or unspecified chronic kidney disease: Secondary | ICD-10-CM | POA: Diagnosis not present

## 2016-10-28 DIAGNOSIS — N2581 Secondary hyperparathyroidism of renal origin: Secondary | ICD-10-CM | POA: Diagnosis not present

## 2016-10-28 DIAGNOSIS — N183 Chronic kidney disease, stage 3 (moderate): Secondary | ICD-10-CM | POA: Diagnosis not present

## 2016-10-28 DIAGNOSIS — M109 Gout, unspecified: Secondary | ICD-10-CM | POA: Diagnosis not present

## 2016-11-05 DIAGNOSIS — K529 Noninfective gastroenteritis and colitis, unspecified: Secondary | ICD-10-CM | POA: Diagnosis not present

## 2016-11-05 DIAGNOSIS — Z6823 Body mass index (BMI) 23.0-23.9, adult: Secondary | ICD-10-CM | POA: Diagnosis not present

## 2016-11-05 DIAGNOSIS — A084 Viral intestinal infection, unspecified: Secondary | ICD-10-CM | POA: Diagnosis not present

## 2016-12-17 DIAGNOSIS — J069 Acute upper respiratory infection, unspecified: Secondary | ICD-10-CM | POA: Diagnosis not present

## 2016-12-17 DIAGNOSIS — Z6823 Body mass index (BMI) 23.0-23.9, adult: Secondary | ICD-10-CM | POA: Diagnosis not present

## 2016-12-17 DIAGNOSIS — Z1389 Encounter for screening for other disorder: Secondary | ICD-10-CM | POA: Diagnosis not present

## 2017-01-25 DIAGNOSIS — Z6824 Body mass index (BMI) 24.0-24.9, adult: Secondary | ICD-10-CM | POA: Diagnosis not present

## 2017-01-25 DIAGNOSIS — I10 Essential (primary) hypertension: Secondary | ICD-10-CM | POA: Diagnosis not present

## 2017-01-25 DIAGNOSIS — E782 Mixed hyperlipidemia: Secondary | ICD-10-CM | POA: Diagnosis not present

## 2017-01-25 DIAGNOSIS — W57XXXA Bitten or stung by nonvenomous insect and other nonvenomous arthropods, initial encounter: Secondary | ICD-10-CM | POA: Diagnosis not present

## 2017-01-25 DIAGNOSIS — T07XXXA Unspecified multiple injuries, initial encounter: Secondary | ICD-10-CM | POA: Diagnosis not present

## 2017-02-08 DIAGNOSIS — T07XXXD Unspecified multiple injuries, subsequent encounter: Secondary | ICD-10-CM | POA: Diagnosis not present

## 2017-02-08 DIAGNOSIS — W57XXXD Bitten or stung by nonvenomous insect and other nonvenomous arthropods, subsequent encounter: Secondary | ICD-10-CM | POA: Diagnosis not present

## 2017-03-09 DIAGNOSIS — J069 Acute upper respiratory infection, unspecified: Secondary | ICD-10-CM | POA: Diagnosis not present

## 2017-03-09 DIAGNOSIS — Z6824 Body mass index (BMI) 24.0-24.9, adult: Secondary | ICD-10-CM | POA: Diagnosis not present

## 2017-03-09 DIAGNOSIS — J019 Acute sinusitis, unspecified: Secondary | ICD-10-CM | POA: Diagnosis not present

## 2017-03-22 DIAGNOSIS — H61001 Unspecified perichondritis of right external ear: Secondary | ICD-10-CM | POA: Diagnosis not present

## 2017-03-22 DIAGNOSIS — Z85828 Personal history of other malignant neoplasm of skin: Secondary | ICD-10-CM | POA: Diagnosis not present

## 2017-03-22 DIAGNOSIS — L820 Inflamed seborrheic keratosis: Secondary | ICD-10-CM | POA: Diagnosis not present

## 2017-03-22 DIAGNOSIS — L308 Other specified dermatitis: Secondary | ICD-10-CM | POA: Diagnosis not present

## 2017-03-22 DIAGNOSIS — Z08 Encounter for follow-up examination after completed treatment for malignant neoplasm: Secondary | ICD-10-CM | POA: Diagnosis not present

## 2017-04-01 DIAGNOSIS — J3089 Other allergic rhinitis: Secondary | ICD-10-CM | POA: Diagnosis not present

## 2017-04-01 DIAGNOSIS — Z6823 Body mass index (BMI) 23.0-23.9, adult: Secondary | ICD-10-CM | POA: Diagnosis not present

## 2017-04-01 DIAGNOSIS — J069 Acute upper respiratory infection, unspecified: Secondary | ICD-10-CM | POA: Diagnosis not present

## 2017-04-01 DIAGNOSIS — Z1389 Encounter for screening for other disorder: Secondary | ICD-10-CM | POA: Diagnosis not present

## 2017-04-19 DIAGNOSIS — Z86718 Personal history of other venous thrombosis and embolism: Secondary | ICD-10-CM | POA: Diagnosis not present

## 2017-04-19 DIAGNOSIS — Z1389 Encounter for screening for other disorder: Secondary | ICD-10-CM | POA: Diagnosis not present

## 2017-04-19 DIAGNOSIS — Z6822 Body mass index (BMI) 22.0-22.9, adult: Secondary | ICD-10-CM | POA: Diagnosis not present

## 2017-04-19 DIAGNOSIS — I10 Essential (primary) hypertension: Secondary | ICD-10-CM | POA: Diagnosis not present

## 2017-04-19 DIAGNOSIS — I77819 Aortic ectasia, unspecified site: Secondary | ICD-10-CM | POA: Diagnosis not present

## 2017-04-19 DIAGNOSIS — E042 Nontoxic multinodular goiter: Secondary | ICD-10-CM | POA: Diagnosis not present

## 2017-04-19 DIAGNOSIS — I779 Disorder of arteries and arterioles, unspecified: Secondary | ICD-10-CM | POA: Diagnosis not present

## 2017-04-19 DIAGNOSIS — E7849 Other hyperlipidemia: Secondary | ICD-10-CM | POA: Diagnosis not present

## 2017-04-19 DIAGNOSIS — N289 Disorder of kidney and ureter, unspecified: Secondary | ICD-10-CM | POA: Diagnosis not present

## 2017-04-19 DIAGNOSIS — I5189 Other ill-defined heart diseases: Secondary | ICD-10-CM | POA: Diagnosis not present

## 2017-04-19 DIAGNOSIS — M810 Age-related osteoporosis without current pathological fracture: Secondary | ICD-10-CM | POA: Diagnosis not present

## 2017-04-19 DIAGNOSIS — M109 Gout, unspecified: Secondary | ICD-10-CM | POA: Diagnosis not present

## 2017-04-22 DIAGNOSIS — D631 Anemia in chronic kidney disease: Secondary | ICD-10-CM | POA: Diagnosis not present

## 2017-04-22 DIAGNOSIS — N183 Chronic kidney disease, stage 3 (moderate): Secondary | ICD-10-CM | POA: Diagnosis not present

## 2017-05-19 DIAGNOSIS — N183 Chronic kidney disease, stage 3 (moderate): Secondary | ICD-10-CM | POA: Diagnosis not present

## 2017-05-19 DIAGNOSIS — D631 Anemia in chronic kidney disease: Secondary | ICD-10-CM | POA: Diagnosis not present

## 2017-05-19 DIAGNOSIS — I129 Hypertensive chronic kidney disease with stage 1 through stage 4 chronic kidney disease, or unspecified chronic kidney disease: Secondary | ICD-10-CM | POA: Diagnosis not present

## 2017-05-19 DIAGNOSIS — N2581 Secondary hyperparathyroidism of renal origin: Secondary | ICD-10-CM | POA: Diagnosis not present

## 2017-05-25 DIAGNOSIS — E7849 Other hyperlipidemia: Secondary | ICD-10-CM | POA: Diagnosis not present

## 2017-06-09 ENCOUNTER — Other Ambulatory Visit (HOSPITAL_COMMUNITY): Payer: Self-pay | Admitting: Internal Medicine

## 2017-06-09 ENCOUNTER — Telehealth: Payer: Self-pay | Admitting: Cardiology

## 2017-06-09 DIAGNOSIS — Z1231 Encounter for screening mammogram for malignant neoplasm of breast: Secondary | ICD-10-CM

## 2017-06-09 NOTE — Telephone Encounter (Signed)
1 yr Echo fu - Aortic dilatation  Dr. Domenic Polite vs  Scheduled in [facility] on March 7,2019

## 2017-06-21 DIAGNOSIS — I10 Essential (primary) hypertension: Secondary | ICD-10-CM | POA: Diagnosis not present

## 2017-06-21 DIAGNOSIS — M1991 Primary osteoarthritis, unspecified site: Secondary | ICD-10-CM | POA: Diagnosis not present

## 2017-06-21 DIAGNOSIS — Z23 Encounter for immunization: Secondary | ICD-10-CM | POA: Diagnosis not present

## 2017-06-21 DIAGNOSIS — N183 Chronic kidney disease, stage 3 (moderate): Secondary | ICD-10-CM | POA: Diagnosis not present

## 2017-06-21 DIAGNOSIS — E041 Nontoxic single thyroid nodule: Secondary | ICD-10-CM | POA: Diagnosis not present

## 2017-06-21 DIAGNOSIS — Z6823 Body mass index (BMI) 23.0-23.9, adult: Secondary | ICD-10-CM | POA: Diagnosis not present

## 2017-06-21 DIAGNOSIS — E782 Mixed hyperlipidemia: Secondary | ICD-10-CM | POA: Diagnosis not present

## 2017-06-21 DIAGNOSIS — Z1389 Encounter for screening for other disorder: Secondary | ICD-10-CM | POA: Diagnosis not present

## 2017-06-21 DIAGNOSIS — K219 Gastro-esophageal reflux disease without esophagitis: Secondary | ICD-10-CM | POA: Diagnosis not present

## 2017-06-21 DIAGNOSIS — I82409 Acute embolism and thrombosis of unspecified deep veins of unspecified lower extremity: Secondary | ICD-10-CM | POA: Diagnosis not present

## 2017-06-21 DIAGNOSIS — Z0001 Encounter for general adult medical examination with abnormal findings: Secondary | ICD-10-CM | POA: Diagnosis not present

## 2017-06-22 DIAGNOSIS — T466X5A Adverse effect of antihyperlipidemic and antiarteriosclerotic drugs, initial encounter: Secondary | ICD-10-CM | POA: Diagnosis not present

## 2017-07-07 ENCOUNTER — Other Ambulatory Visit (HOSPITAL_COMMUNITY): Payer: Self-pay | Admitting: Internal Medicine

## 2017-07-07 ENCOUNTER — Ambulatory Visit (HOSPITAL_COMMUNITY)
Admission: RE | Admit: 2017-07-07 | Discharge: 2017-07-07 | Disposition: A | Discharge status: Home / Self Care | Payer: PPO | Source: Ambulatory Visit | Attending: Internal Medicine | Admitting: Internal Medicine

## 2017-07-07 ENCOUNTER — Encounter (HOSPITAL_COMMUNITY): Payer: Self-pay

## 2017-07-07 DIAGNOSIS — Z1231 Encounter for screening mammogram for malignant neoplasm of breast: Secondary | ICD-10-CM | POA: Diagnosis not present

## 2017-07-07 DIAGNOSIS — E2839 Other primary ovarian failure: Secondary | ICD-10-CM

## 2017-07-08 ENCOUNTER — Ambulatory Visit (HOSPITAL_COMMUNITY)
Admission: RE | Admit: 2017-07-08 | Discharge: 2017-07-08 | Disposition: A | Discharge status: Home / Self Care | Payer: PPO | Source: Ambulatory Visit | Attending: Internal Medicine | Admitting: Internal Medicine

## 2017-07-08 DIAGNOSIS — Z780 Asymptomatic menopausal state: Secondary | ICD-10-CM | POA: Diagnosis not present

## 2017-07-08 DIAGNOSIS — M818 Other osteoporosis without current pathological fracture: Secondary | ICD-10-CM | POA: Diagnosis not present

## 2017-07-08 DIAGNOSIS — M810 Age-related osteoporosis without current pathological fracture: Secondary | ICD-10-CM | POA: Diagnosis not present

## 2017-07-08 DIAGNOSIS — E2839 Other primary ovarian failure: Secondary | ICD-10-CM

## 2017-07-29 DIAGNOSIS — Z6824 Body mass index (BMI) 24.0-24.9, adult: Secondary | ICD-10-CM | POA: Diagnosis not present

## 2017-07-29 DIAGNOSIS — J069 Acute upper respiratory infection, unspecified: Secondary | ICD-10-CM | POA: Diagnosis not present

## 2017-08-10 DIAGNOSIS — Z1389 Encounter for screening for other disorder: Secondary | ICD-10-CM | POA: Diagnosis not present

## 2017-08-10 DIAGNOSIS — I77819 Aortic ectasia, unspecified site: Secondary | ICD-10-CM | POA: Diagnosis not present

## 2017-08-10 DIAGNOSIS — I10 Essential (primary) hypertension: Secondary | ICD-10-CM | POA: Diagnosis not present

## 2017-08-10 DIAGNOSIS — J329 Chronic sinusitis, unspecified: Secondary | ICD-10-CM | POA: Diagnosis not present

## 2017-08-10 DIAGNOSIS — Z6823 Body mass index (BMI) 23.0-23.9, adult: Secondary | ICD-10-CM | POA: Diagnosis not present

## 2017-08-10 DIAGNOSIS — J9801 Acute bronchospasm: Secondary | ICD-10-CM | POA: Diagnosis not present

## 2017-08-10 DIAGNOSIS — M1991 Primary osteoarthritis, unspecified site: Secondary | ICD-10-CM | POA: Diagnosis not present

## 2017-08-11 ENCOUNTER — Other Ambulatory Visit: Payer: Self-pay

## 2017-08-11 ENCOUNTER — Ambulatory Visit (INDEPENDENT_AMBULATORY_CARE_PROVIDER_SITE_OTHER): Payer: PPO

## 2017-08-11 DIAGNOSIS — I77819 Aortic ectasia, unspecified site: Secondary | ICD-10-CM

## 2017-08-12 ENCOUNTER — Telehealth: Payer: Self-pay | Admitting: Cardiology

## 2017-08-12 NOTE — Telephone Encounter (Signed)
Patient called stating that she received a call on 08/11/2017 . She is returning a call.

## 2017-08-12 NOTE — Telephone Encounter (Signed)
-----   Message from Acquanetta Chain, LPN sent at 7/0/1410  4:15 PM EST -----   ----- Message ----- From: Satira Sark, MD Sent: 08/11/2017   3:50 PM To: Merlene Laughter, LPN  Results reviewed.  Stable findings.  LVEF is normal.  Ascending aorta 43 mm as before.  Only mild aortic regurgitation. A copy of this test should be forwarded to Redmond School, MD.

## 2017-08-12 NOTE — Telephone Encounter (Signed)
Patient informed. Copy sent to PCP °

## 2017-08-31 NOTE — Progress Notes (Signed)
Cardiology Office Note  Date: 09/01/2017   ID: MARILOUISE Morris, DOB 08-07-1935, MRN [id number]  PCP: Redmond School, MD  Primary Cardiologist: Rozann Lesches, MD   Chief Complaint  Patient presents with  . Cardiac follow-up    History of Present Illness: Allison Morris is a 82 y.o. female last seen in March 2018.  She presents for a routine follow-up visit.  She does not report any chest pain or unusual shortness of breath with her typical ADLs since last visit.  She has had no palpitations or syncope.  Recent follow-up echocardiogram in March revealed LVEF 65-70% with stable ascending aortic dilatation at 43 mm.  Mild aortic regurgitation was noted.  I reviewed her medications.  She had some attempted adjustments in antihypertensive regimen by PCP since I saw her, but is back on Bystolic and her blood pressure is well controlled today.  I personally reviewed her ECG today which shows sinus bradycardia with low voltage and decreased R wave progression.  Past Medical History:  Diagnosis Date  . Aortic dilatation (HCC)   . Aortic regurgitation   . Essential hypertension   . History of DVT (deep vein thrombosis)    Left leg 1997   . Hyperlipemia     Past Surgical History:  Procedure Laterality Date  . APPENDECTOMY    . CARPAL TUNNEL RELEASE    . TONSILLECTOMY      Current Outpatient Medications  Medication Sig Dispense Refill  . acetaminophen (TYLENOL) 500 MG tablet Take 500 mg by mouth every 6 (six) hours as needed for mild pain or moderate pain.     Marland Kitchen aspirin EC 81 MG EC tablet Take 1 tablet (81 mg total) by mouth daily. 100 tablet 1  . Cholecalciferol (VITAMIN D3) 1000 units CAPS Take 1 capsule by mouth daily.     . clobetasol cream (TEMOVATE) 1.77 % Apply 1 application topically daily as needed (for irritation).     . clopidogrel (PLAVIX) 75 MG tablet Take 1 tablet (75 mg total) by mouth daily. 30 tablet 2  . colchicine 0.6 MG tablet Take 0.6 mg by mouth. 1/2  tablet daily    . fexofenadine (ALLEGRA) 180 MG tablet Take 180 mg by mouth daily.    . fluticasone (FLONASE) 50 MCG/ACT nasal spray Place 1 spray into both nostrils daily.     . Glucosamine-Chondroit-Vit C-Mn (GLUCOSAMINE 1500 COMPLEX PO) Take 1 tablet by mouth daily.     . nebivolol (BYSTOLIC) 5 MG tablet Take 5 mg by mouth 2 (two) times daily.     . Omega-3 Fatty Acids (FISH OIL) 1200 MG CAPS Take 1 capsule by mouth 2 (two) times daily.     . pantoprazole (PROTONIX) 40 MG tablet Take 40 mg by mouth daily.      No current facility-administered medications for this visit.    Allergies:  Biaxin [clarithromycin]; Doxycycline; Estrogens; Macrodantin [nitrofurantoin]; Statins; Ceftin [cefuroxime]; Ciprofloxacin; and Sulfa antibiotics   Social History: The patient  reports that she has never smoked. She has never used smokeless tobacco. She reports that she does not drink alcohol or use drugs.   ROS:  Please see the history of present illness. Otherwise, complete review of systems is positive for none.  All other systems are reviewed and negative.   Physical Exam: VS:  BP 128/78   Pulse (!) 54   Ht 5\' 6"  (1.676 m)   Wt 138 lb (62.6 kg)   SpO2 91%   BMI 22.27 kg/m ,  BMI Body mass index is 22.27 kg/m.  Wt Readings from Last 3 Encounters:  09/01/17 138 lb (62.6 kg)  09/03/16 135 lb (61.2 kg)  11/14/15 130 lb 9.6 oz (59.2 kg)    General: Patient appears comfortable at rest. HEENT: Conjunctiva and lids normal, oropharynx clear. Neck: Supple, no elevated JVP or carotid bruits, no thyromegaly. Lungs: Clear to auscultation, nonlabored breathing at rest. Cardiac: Regular rate and rhythm, no S3, soft systolic murmur. Abdomen: Soft, nontender, bowel sounds present, no guarding or rebound. Extremities: No pitting edema, distal pulses 2+. Skin: Warm and dry. Musculoskeletal: No kyphosis. Neuropsychiatric: Alert and oriented x3, affect grossly appropriate.  ECG: I personally reviewed the  tracing from 09/03/2016 which showed sinus bradycardia with low voltage in the limb leads.  Recent Labwork:    Component Value Date/Time   CHOL 248 (H) 11/15/2015 0542   TRIG 87 11/15/2015 0542   HDL 48 11/15/2015 0542   CHOLHDL 5.2 11/15/2015 0542   VLDL 17 11/15/2015 0542   LDLCALC 183 (H) 11/15/2015 0542    Other Studies Reviewed Today:  Echocardiogram 08/11/2017: Study Conclusions  - Left ventricle: The cavity size was normal. Wall thickness was   normal. Systolic function was vigorous. The estimated ejection   fraction was in the range of 65% to 70%. Wall motion was normal;   there were no regional wall motion abnormalities. Doppler   parameters are consistent with abnormal left ventricular   relaxation (grade 1 diastolic dysfunction). - Aortic valve: There was mild regurgitation. - Aortic root: The aortic root was mildly dilated. - Ascending aorta: The ascending aorta was mildly dilated. Largest   diameter measured 43 mm. - Mitral valve: Mildly calcified annulus. There was mild   regurgitation. - Right atrium: Central venous pressure (est): 3 mm Hg. - Atrial septum: No defect or patent foramen ovale was identified. - Tricuspid valve: There was mild regurgitation. - Pulmonary arteries: PA peak pressure: 26 mm Hg (S). - Pericardium, extracardiac: A prominent pericardial fat pad was   present.  Assessment and Plan:  1.  Mildly dilated aortic root, overall stable by follow-up echocardiogram.  She is asymptomatic.  2.  Essential hypertension, blood pressure is well controlled today.  Continue on Bystolic.  If blood pressure trend worsens, we can see her back to assist with adjustments.  3.  Hyperlipidemia with known statin intolerance.  She continues to follow with PCP.  4.  Aortic regurgitation, mild by follow-up echocardiogram.  Current medicines were reviewed with the patient today.   Orders Placed This Encounter  Procedures  . EKG 12-Lead  . ECHOCARDIOGRAM  COMPLETE    Disposition: Follow-up in 1 year.  Signed, Satira Sark, MD, [facility] 09/01/2017 11:20 AM    [facility] [place], [place], [place] [address] Phone: [phone]; Fax: [phone]

## 2017-09-01 ENCOUNTER — Ambulatory Visit: Payer: PPO | Admitting: Cardiology

## 2017-09-01 ENCOUNTER — Encounter: Payer: Self-pay | Admitting: Cardiology

## 2017-09-01 VITALS — BP 128/78 | HR 54 | Ht 66.0 in | Wt 138.0 lb

## 2017-09-01 DIAGNOSIS — I351 Nonrheumatic aortic (valve) insufficiency: Secondary | ICD-10-CM | POA: Diagnosis not present

## 2017-09-01 DIAGNOSIS — Z789 Other specified health status: Secondary | ICD-10-CM

## 2017-09-01 DIAGNOSIS — I77819 Aortic ectasia, unspecified site: Secondary | ICD-10-CM | POA: Diagnosis not present

## 2017-09-01 DIAGNOSIS — I10 Essential (primary) hypertension: Secondary | ICD-10-CM

## 2017-09-01 NOTE — Patient Instructions (Signed)
Medication Instructions:  Your physician recommends that you continue on your current medications as directed. Please refer to the Current Medication list given to you today.  Labwork: NONE  Testing/Procedures: Your physician has requested that you have an echocardiogram IN 1 YEAR. Echocardiography is a painless test that uses sound waves to create images of your heart. It provides your doctor with information about the size and shape of your heart and how well your heart's chambers and valves are working. This procedure takes approximately one hour. There are no restrictions for this procedure.  Follow-Up: Your physician wants you to follow-up in: 1 YEAR WITH DR. MCDOWELL. You will receive a reminder letter in the mail two months in advance. If you don't receive a letter, please call our office to schedule the follow-up appointment.  Any Other Special Instructions Will Be Listed Below (If Applicable).  If you need a refill on your cardiac medications before your next appointment, please call your pharmacy. 

## 2017-09-07 ENCOUNTER — Other Ambulatory Visit: Payer: PPO

## 2017-09-22 DIAGNOSIS — I10 Essential (primary) hypertension: Secondary | ICD-10-CM | POA: Diagnosis not present

## 2017-09-22 DIAGNOSIS — M1000 Idiopathic gout, unspecified site: Secondary | ICD-10-CM | POA: Diagnosis not present

## 2017-09-22 DIAGNOSIS — Z6824 Body mass index (BMI) 24.0-24.9, adult: Secondary | ICD-10-CM | POA: Diagnosis not present

## 2017-09-22 DIAGNOSIS — M1991 Primary osteoarthritis, unspecified site: Secondary | ICD-10-CM | POA: Diagnosis not present

## 2017-09-22 DIAGNOSIS — L03011 Cellulitis of right finger: Secondary | ICD-10-CM | POA: Diagnosis not present

## 2017-09-22 DIAGNOSIS — E063 Autoimmune thyroiditis: Secondary | ICD-10-CM | POA: Diagnosis not present

## 2017-09-28 DIAGNOSIS — Z6824 Body mass index (BMI) 24.0-24.9, adult: Secondary | ICD-10-CM | POA: Diagnosis not present

## 2017-09-28 DIAGNOSIS — J0100 Acute maxillary sinusitis, unspecified: Secondary | ICD-10-CM | POA: Diagnosis not present

## 2017-10-11 DIAGNOSIS — Z85828 Personal history of other malignant neoplasm of skin: Secondary | ICD-10-CM | POA: Diagnosis not present

## 2017-10-11 DIAGNOSIS — L570 Actinic keratosis: Secondary | ICD-10-CM | POA: Diagnosis not present

## 2017-10-11 DIAGNOSIS — L820 Inflamed seborrheic keratosis: Secondary | ICD-10-CM | POA: Diagnosis not present

## 2017-10-11 DIAGNOSIS — Z08 Encounter for follow-up examination after completed treatment for malignant neoplasm: Secondary | ICD-10-CM | POA: Diagnosis not present

## 2017-10-11 DIAGNOSIS — X32XXXD Exposure to sunlight, subsequent encounter: Secondary | ICD-10-CM | POA: Diagnosis not present

## 2017-10-17 DIAGNOSIS — I5189 Other ill-defined heart diseases: Secondary | ICD-10-CM | POA: Diagnosis not present

## 2017-10-17 DIAGNOSIS — Z1389 Encounter for screening for other disorder: Secondary | ICD-10-CM | POA: Diagnosis not present

## 2017-10-17 DIAGNOSIS — M810 Age-related osteoporosis without current pathological fracture: Secondary | ICD-10-CM | POA: Diagnosis not present

## 2017-10-17 DIAGNOSIS — M1000 Idiopathic gout, unspecified site: Secondary | ICD-10-CM | POA: Diagnosis not present

## 2017-10-17 DIAGNOSIS — N289 Disorder of kidney and ureter, unspecified: Secondary | ICD-10-CM | POA: Diagnosis not present

## 2017-10-17 DIAGNOSIS — E7849 Other hyperlipidemia: Secondary | ICD-10-CM | POA: Diagnosis not present

## 2017-10-17 DIAGNOSIS — E042 Nontoxic multinodular goiter: Secondary | ICD-10-CM | POA: Diagnosis not present

## 2017-10-17 DIAGNOSIS — Z8673 Personal history of transient ischemic attack (TIA), and cerebral infarction without residual deficits: Secondary | ICD-10-CM | POA: Diagnosis not present

## 2017-10-17 DIAGNOSIS — I10 Essential (primary) hypertension: Secondary | ICD-10-CM | POA: Diagnosis not present

## 2017-10-17 DIAGNOSIS — Z6822 Body mass index (BMI) 22.0-22.9, adult: Secondary | ICD-10-CM | POA: Diagnosis not present

## 2017-10-17 DIAGNOSIS — I77819 Aortic ectasia, unspecified site: Secondary | ICD-10-CM | POA: Diagnosis not present

## 2017-10-28 DIAGNOSIS — Z6823 Body mass index (BMI) 23.0-23.9, adult: Secondary | ICD-10-CM | POA: Diagnosis not present

## 2017-10-28 DIAGNOSIS — M1000 Idiopathic gout, unspecified site: Secondary | ICD-10-CM | POA: Diagnosis not present

## 2017-11-15 DIAGNOSIS — Z6823 Body mass index (BMI) 23.0-23.9, adult: Secondary | ICD-10-CM | POA: Diagnosis not present

## 2017-11-15 DIAGNOSIS — R39198 Other difficulties with micturition: Secondary | ICD-10-CM | POA: Diagnosis not present

## 2017-11-15 DIAGNOSIS — Z1389 Encounter for screening for other disorder: Secondary | ICD-10-CM | POA: Diagnosis not present

## 2017-11-15 DIAGNOSIS — J029 Acute pharyngitis, unspecified: Secondary | ICD-10-CM | POA: Diagnosis not present

## 2017-12-05 DIAGNOSIS — Z6823 Body mass index (BMI) 23.0-23.9, adult: Secondary | ICD-10-CM | POA: Diagnosis not present

## 2017-12-05 DIAGNOSIS — I10 Essential (primary) hypertension: Secondary | ICD-10-CM | POA: Diagnosis not present

## 2017-12-10 ENCOUNTER — Encounter (HOSPITAL_COMMUNITY): Payer: Self-pay | Admitting: *Deleted

## 2017-12-10 ENCOUNTER — Observation Stay (HOSPITAL_COMMUNITY)
Admission: EM | Admit: 2017-12-10 | Discharge: 2017-12-12 | Disposition: A | Discharge status: Home / Self Care | Payer: PPO | Attending: Internal Medicine | Admitting: Internal Medicine

## 2017-12-10 ENCOUNTER — Emergency Department (HOSPITAL_COMMUNITY): Payer: PPO

## 2017-12-10 DIAGNOSIS — I10 Essential (primary) hypertension: Secondary | ICD-10-CM | POA: Diagnosis not present

## 2017-12-10 DIAGNOSIS — I169 Hypertensive crisis, unspecified: Secondary | ICD-10-CM | POA: Diagnosis not present

## 2017-12-10 DIAGNOSIS — R42 Dizziness and giddiness: Secondary | ICD-10-CM | POA: Diagnosis not present

## 2017-12-10 DIAGNOSIS — Z79899 Other long term (current) drug therapy: Secondary | ICD-10-CM | POA: Diagnosis not present

## 2017-12-10 DIAGNOSIS — R51 Headache: Secondary | ICD-10-CM | POA: Diagnosis not present

## 2017-12-10 DIAGNOSIS — Z7982 Long term (current) use of aspirin: Secondary | ICD-10-CM | POA: Diagnosis not present

## 2017-12-10 DIAGNOSIS — Z8673 Personal history of transient ischemic attack (TIA), and cerebral infarction without residual deficits: Secondary | ICD-10-CM | POA: Insufficient documentation

## 2017-12-10 LAB — CBC WITH DIFFERENTIAL/PLATELET
BASOS ABS: 0 10*3/uL (ref 0.0–0.1)
BASOS PCT: 1 %
EOS ABS: 0.2 10*3/uL (ref 0.0–0.7)
Eosinophils Relative: 3 %
HEMATOCRIT: 44.3 % (ref 36.0–46.0)
HEMOGLOBIN: 15.4 g/dL — AB (ref 12.0–15.0)
Lymphocytes Relative: 28 %
Lymphs Abs: 1.7 10*3/uL (ref 0.7–4.0)
MCH: 35.2 pg — ABNORMAL HIGH (ref 26.0–34.0)
MCHC: 34.8 g/dL (ref 30.0–36.0)
MCV: 101.4 fL — ABNORMAL HIGH (ref 78.0–100.0)
MONO ABS: 0.5 10*3/uL (ref 0.1–1.0)
MONOS PCT: 7 %
NEUTROS PCT: 61 %
Neutro Abs: 3.8 10*3/uL (ref 1.7–7.7)
Platelets: 174 10*3/uL (ref 150–400)
RBC: 4.37 MIL/uL (ref 3.87–5.11)
RDW: 12.3 % (ref 11.5–15.5)
WBC: 6.2 10*3/uL (ref 4.0–10.5)

## 2017-12-10 LAB — COMPREHENSIVE METABOLIC PANEL
ALBUMIN: 4.1 g/dL (ref 3.5–5.0)
ALT: 18 U/L (ref 0–44)
ANION GAP: 8 (ref 5–15)
AST: 22 U/L (ref 15–41)
Alkaline Phosphatase: 71 U/L (ref 38–126)
BILIRUBIN TOTAL: 0.9 mg/dL (ref 0.3–1.2)
BUN: 18 mg/dL (ref 8–23)
CO2: 24 mmol/L (ref 22–32)
Calcium: 9.4 mg/dL (ref 8.9–10.3)
Chloride: 101 mmol/L (ref 98–111)
Creatinine, Ser: 1.26 mg/dL — ABNORMAL HIGH (ref 0.44–1.00)
GFR calc Af Amer: 45 mL/min — ABNORMAL LOW (ref >60)
GFR calc non Af Amer: 39 mL/min — ABNORMAL LOW (ref >60)
Glucose, Bld: 108 mg/dL — ABNORMAL HIGH (ref 70–99)
POTASSIUM: 3.7 mmol/L (ref 3.5–5.1)
SODIUM: 133 mmol/L — AB (ref 135–145)
Total Protein: 7.2 g/dL (ref 6.5–8.1)

## 2017-12-10 LAB — I-STAT CHEM 8, ED
BUN: 24 mg/dL — AB (ref 8–23)
CALCIUM ION: 1.15 mmol/L (ref 1.15–1.40)
CHLORIDE: 100 mmol/L (ref 98–111)
CREATININE: 1.3 mg/dL — AB (ref 0.44–1.00)
Glucose, Bld: 98 mg/dL (ref 70–99)
HCT: 45 % (ref 36.0–46.0)
Hemoglobin: 15.3 g/dL — ABNORMAL HIGH (ref 12.0–15.0)
POTASSIUM: 5.1 mmol/L (ref 3.5–5.1)
Sodium: 132 mmol/L — ABNORMAL LOW (ref 135–145)
TCO2: 24 mmol/L (ref 22–32)

## 2017-12-10 LAB — TROPONIN I

## 2017-12-10 MED ORDER — SODIUM CHLORIDE 0.9 % IV BOLUS
500.0000 mL | Freq: Once | INTRAVENOUS | Status: AC
  Administered 2017-12-10: 500 mL via INTRAVENOUS

## 2017-12-10 MED ORDER — SODIUM CHLORIDE 0.9 % IV BOLUS
250.0000 mL | Freq: Once | INTRAVENOUS | Status: AC
  Administered 2017-12-10: 250 mL via INTRAVENOUS

## 2017-12-10 MED ORDER — HYDRALAZINE HCL 20 MG/ML IJ SOLN
5.0000 mg | Freq: Once | INTRAMUSCULAR | Status: AC
  Administered 2017-12-10: 5 mg via INTRAVENOUS
  Filled 2017-12-10: qty 1

## 2017-12-10 NOTE — ED Provider Notes (Signed)
[facility] EMERGENCY DEPARTMENT Provider Note   CSN: [id number] Arrival date & time: 12/10/17  2139     History   Chief Complaint Chief Complaint  Patient presents with  . Hypertension    HPI Allison Morris is a 82 y.o. female.  Patient complains of dizziness today.  She states her blood pressures been high.  She takes by systolic 5 mg once a day but took an extra one today symptoms did not improve  The history is provided by the patient. No language interpreter was used.  Hypertension  This is a new problem. The current episode started 12 to 24 hours ago. The problem occurs constantly. The problem has not changed since onset.Pertinent negatives include no chest pain, no abdominal pain and no headaches. Nothing aggravates the symptoms. Nothing relieves the symptoms. She has tried nothing for the symptoms. The treatment provided no relief.    Past Medical History:  Diagnosis Date  . Aortic dilatation (HCC)   . Aortic regurgitation   . Essential hypertension   . History of DVT (deep vein thrombosis)    Left leg 1997   . Hyperlipemia     Patient Active Problem List   Diagnosis Date Noted  . TIA (transient ischemic attack) 11/14/2015  . Essential hypertension   . Hyperlipemia   . History of DVT (deep vein thrombosis)   . Aortic dilatation [facility])     Past Surgical History:  Procedure Laterality Date  . APPENDECTOMY    . CARPAL TUNNEL RELEASE    . TONSILLECTOMY       OB History   None      Home Medications    Prior to Admission medications   Medication Sig Start Date End Date Taking? Authorizing Provider  nebivolol (BYSTOLIC) 5 MG tablet Take 5-10 mg by mouth daily.    Yes [provider]  acetaminophen (TYLENOL) 500 MG tablet Take 500 mg by mouth every 6 (six) hours as needed for mild pain or moderate pain.     [provider]  aspirin EC 81 MG EC tablet Take 1 tablet (81 mg total) by mouth daily. 11/15/15   Orvan Falconer, MD  Cholecalciferol  (VITAMIN D3) 1000 units CAPS Take 1 capsule by mouth daily.     [provider]  clobetasol cream (TEMOVATE) 9.48 % Apply 1 application topically daily as needed (for irritation).     [provider]  clopidogrel (PLAVIX) 75 MG tablet Take 1 tablet (75 mg total) by mouth daily. 11/15/15   Orvan Falconer, MD  colchicine 0.6 MG tablet Take 0.6 mg by mouth. 1/2 tablet daily    [provider]  fexofenadine (ALLEGRA) 180 MG tablet Take 180 mg by mouth daily.    [provider]  fluticasone (FLONASE) 50 MCG/ACT nasal spray Place 1 spray into both nostrils daily.     [provider]  Glucosamine-Chondroit-Vit C-Mn (GLUCOSAMINE 1500 COMPLEX PO) Take 1 tablet by mouth daily.     [provider]  Omega-3 Fatty Acids (FISH OIL) 1200 MG CAPS Take 1 capsule by mouth 2 (two) times daily.     [provider]  pantoprazole (PROTONIX) 40 MG tablet Take 40 mg by mouth daily.     [provider]    Family History Family History  Problem Relation Age of Onset  . Aneurysm Sister   . Valvular heart disease Sister   . Aneurysm Paternal Grandfather   . Heart attack Paternal Grandfather     Social  History Social History   Tobacco Use  . Smoking status: Never Smoker  . Smokeless tobacco: Never Used  Substance Use Topics  . Alcohol use: No    Alcohol/week: 0.0 oz  . Drug use: No     Allergies   Biaxin [clarithromycin]; Doxycycline; Estrogens; Macrodantin [nitrofurantoin]; Statins; Ceftin [cefuroxime]; Ciprofloxacin; and Sulfa antibiotics   Review of Systems Review of Systems  Constitutional: Negative for appetite change and fatigue.  HENT: Negative for congestion, ear discharge and sinus pressure.   Eyes: Negative for discharge.  Respiratory: Negative for cough.   Cardiovascular: Negative for chest pain.  Gastrointestinal: Negative for abdominal pain and diarrhea.  Genitourinary: Negative for frequency and hematuria.    Musculoskeletal: Negative for back pain.  Skin: Negative for rash.  Neurological: Positive for dizziness. Negative for seizures and headaches.  Psychiatric/Behavioral: Negative for hallucinations.     Physical Exam Updated Vital Signs BP (!) 148/60   Pulse 70   Resp 19   Wt 59.9 kg (132 lb)   SpO2 98%   BMI 21.31 kg/m   Physical Exam  Constitutional: She is oriented to person, place, and time. She appears well-developed.  HENT:  Head: Normocephalic.  Eyes: Conjunctivae and EOM are normal. No scleral icterus.  Neck: Neck supple. No thyromegaly present.  Cardiovascular: Normal rate and regular rhythm. Exam reveals no gallop and no friction rub.  No murmur heard. Pulmonary/Chest: No stridor. She has no wheezes. She has no rales. She exhibits no tenderness.  Abdominal: She exhibits no distension. There is no tenderness. There is no rebound.  Musculoskeletal: Normal range of motion. She exhibits no edema.  Lymphadenopathy:    She has no cervical adenopathy.  Neurological: She is oriented to person, place, and time. She exhibits normal muscle tone. Coordination normal.  Skin: No rash noted. No erythema.  Psychiatric: She has a normal mood and affect. Her behavior is normal.     ED Treatments / Results  Labs (all labs ordered are listed, but only abnormal results are displayed) Labs Reviewed  CBC WITH DIFFERENTIAL/PLATELET - Abnormal; Notable for the following components:      Result Value   Hemoglobin 15.4 (*)    MCV 101.4 (*)    MCH 35.2 (*)    All other components within normal limits  COMPREHENSIVE METABOLIC PANEL - Abnormal; Notable for the following components:   Sodium 133 (*)    Glucose, Bld 108 (*)    Creatinine, Ser 1.26 (*)    GFR calc non Af Amer 39 (*)    GFR calc Af Amer 45 (*)    All other components within normal limits  I-STAT CHEM 8, ED - Abnormal; Notable for the following components:   Sodium 132 (*)    BUN 24 (*)    Creatinine, Ser 1.30 (*)     Hemoglobin 15.3 (*)    All other components within normal limits  TROPONIN I    EKG EKG Interpretation  Date/Time:  Saturday December 10 2017 21:51:55 EDT Ventricular Rate:  59 PR Interval:    QRS Duration: 96 QT Interval:  438 QTC Calculation: 434 R Axis:   65 Text Interpretation:  Sinus rhythm Low voltage, precordial leads Confirmed by Milton Ferguson [phone]) on 12/10/2017 11:22:10 PM   Radiology Dg Chest 2 View  Result Date: 12/10/2017 CLINICAL DATA:  Initial evaluation for acute hypertensive crisis. EXAM: CHEST - 2 VIEW COMPARISON:  Prior radiograph from 07/24/2012. FINDINGS: Transverse heart size at the upper limits of normal. Mediastinal silhouette  within normal limits. Mild aortic atherosclerosis. The lungs are normally inflated. No airspace consolidation, pleural effusion, or pulmonary edema is identified. There is no pneumothorax. No acute osseous abnormality identified. IMPRESSION: 1. No active cardiopulmonary disease. 2. Aortic atherosclerosis. Electronically Signed   By: Jeannine Boga M.D.   On: 12/10/2017 22:48   Ct Head Wo Contrast  Result Date: 12/10/2017 CLINICAL DATA:  Altered level of consciousness. Headache today and rising blood pressure. EXAM: CT HEAD WITHOUT CONTRAST TECHNIQUE: Contiguous axial images were obtained from the base of the skull through the vertex without intravenous contrast. COMPARISON:  11/14/2015 FINDINGS: Brain: Mild diffuse cerebral atrophy. Patchy low-attenuation changes in the deep white matter consistent small vessel ischemia. No mass effect or midline shift. No abnormal extra-axial fluid collections. Gray-white matter junctions are distinct. Basal cisterns are not effaced. No acute intracranial hemorrhage. Vascular: Intracranial arterial vascular calcifications are present. Skull: Normal. Negative for fracture or focal lesion. Sinuses/Orbits: No acute finding. Other: None. IMPRESSION: No acute intracranial abnormalities. Mild chronic atrophy and  small vessel ischemic changes. Electronically Signed   By: Lucienne Capers M.D.   On: 12/10/2017 22:42    Procedures Procedures (including critical care time)  Medications Ordered in ED Medications  sodium chloride 0.9 % bolus 250 mL (250 mLs Intravenous New Bag/Given 12/10/17 2251)  sodium chloride 0.9 % bolus 500 mL (has no administration in time range)  sodium chloride 0.9 % bolus 500 mL (has no administration in time range)  hydrALAZINE (APRESOLINE) injection 5 mg (5 mg Intravenous Given 12/10/17 2225)     Initial Impression / Assessment and Plan / ED Course  I have reviewed the triage vital signs and the nursing notes.  Pertinent labs & imaging results that were available during my care of the patient were reviewed by me and considered in my medical decision making (see chart for details).     Patient with dizziness and hypertension.  Patient was given 5 mg of hydralazine and this lowered her blood pressure but she stated that the dizziness seemed to get worse.  Patient will be given about a liter of  normal saline and reevaluated.  Most likely she will follow-up with her PCP  Final Clinical Impressions(s) / ED Diagnoses   Final diagnoses:  None    ED Discharge Orders    None       Milton Ferguson, MD 12/10/17 2338

## 2017-12-10 NOTE — ED Notes (Signed)
Pt with hx HTN Stage 3 renal disease Took home meds without decrease in BP  Here for eval

## 2017-12-10 NOTE — ED Notes (Signed)
Pt c/o of pain with IV, Dr. Roderic Palau informed and ordered orthostatic vitals; orthostatic vitals taken and results shown to Dr. Roderic Palau

## 2017-12-10 NOTE — Discharge Instructions (Signed)
Follow-up with your family doctor this week for recheck of blood pressure

## 2017-12-10 NOTE — ED Notes (Signed)
From CT 

## 2017-12-10 NOTE — ED Triage Notes (Signed)
Pt is here for HTN.  Recent adjustments to her BP meds by her PCP.  Pt has been having a HA today and BP has been rising.  No CP with this. No sob.

## 2017-12-10 NOTE — ED Notes (Signed)
To CT

## 2017-12-10 NOTE — ED Notes (Signed)
Pt complaint of feeling like she is going to "pass out"   Per daughter her mother is sensitive to many meds

## 2017-12-11 ENCOUNTER — Other Ambulatory Visit: Payer: Self-pay

## 2017-12-11 DIAGNOSIS — I10 Essential (primary) hypertension: Secondary | ICD-10-CM | POA: Diagnosis not present

## 2017-12-11 DIAGNOSIS — R42 Dizziness and giddiness: Secondary | ICD-10-CM

## 2017-12-11 LAB — COMPREHENSIVE METABOLIC PANEL
ALT: 16 U/L (ref 0–44)
AST: 17 U/L (ref 15–41)
Albumin: 3.5 g/dL (ref 3.5–5.0)
Alkaline Phosphatase: 53 U/L (ref 38–126)
Anion gap: 9 (ref 5–15)
BUN: 14 mg/dL (ref 8–23)
CHLORIDE: 102 mmol/L (ref 98–111)
CO2: 23 mmol/L (ref 22–32)
CREATININE: 1.02 mg/dL — AB (ref 0.44–1.00)
Calcium: 8.8 mg/dL — ABNORMAL LOW (ref 8.9–10.3)
GFR calc Af Amer: 59 mL/min — ABNORMAL LOW (ref >60)
GFR, EST NON AFRICAN AMERICAN: 51 mL/min — AB (ref >60)
GLUCOSE: 104 mg/dL — AB (ref 70–99)
Potassium: 3.5 mmol/L (ref 3.5–5.1)
Sodium: 134 mmol/L — ABNORMAL LOW (ref 135–145)
Total Bilirubin: 1.3 mg/dL — ABNORMAL HIGH (ref 0.3–1.2)
Total Protein: 6.3 g/dL — ABNORMAL LOW (ref 6.5–8.1)

## 2017-12-11 LAB — CBC WITH DIFFERENTIAL/PLATELET
BASOS ABS: 0 10*3/uL (ref 0.0–0.1)
Basophils Relative: 0 %
EOS ABS: 0.1 10*3/uL (ref 0.0–0.7)
Eosinophils Relative: 1 %
HCT: 40.9 % (ref 36.0–46.0)
Hemoglobin: 13.9 g/dL (ref 12.0–15.0)
LYMPHS ABS: 0.9 10*3/uL (ref 0.7–4.0)
Lymphocytes Relative: 13 %
MCH: 34.4 pg — ABNORMAL HIGH (ref 26.0–34.0)
MCHC: 34 g/dL (ref 30.0–36.0)
MCV: 101.2 fL — AB (ref 78.0–100.0)
MONO ABS: 0.6 10*3/uL (ref 0.1–1.0)
MONOS PCT: 8 %
Neutro Abs: 5.3 10*3/uL (ref 1.7–7.7)
Neutrophils Relative %: 78 %
PLATELETS: 163 10*3/uL (ref 150–400)
RBC: 4.04 MIL/uL (ref 3.87–5.11)
RDW: 12.3 % (ref 11.5–15.5)
WBC: 6.8 10*3/uL (ref 4.0–10.5)

## 2017-12-11 LAB — TROPONIN I

## 2017-12-11 MED ORDER — HEPARIN SODIUM (PORCINE) 5000 UNIT/ML IJ SOLN
5000.0000 [IU] | Freq: Three times a day (TID) | INTRAMUSCULAR | Status: DC
  Administered 2017-12-11 – 2017-12-12 (×4): 5000 [IU] via SUBCUTANEOUS
  Filled 2017-12-11 (×4): qty 1

## 2017-12-11 MED ORDER — OMEGA-3-ACID ETHYL ESTERS 1 G PO CAPS
1.0000 | ORAL_CAPSULE | Freq: Two times a day (BID) | ORAL | Status: DC
  Administered 2017-12-11 – 2017-12-12 (×3): 1 g via ORAL
  Filled 2017-12-11 (×6): qty 1

## 2017-12-11 MED ORDER — NEBIVOLOL HCL 10 MG PO TABS
10.0000 mg | ORAL_TABLET | Freq: Every day | ORAL | Status: DC
  Administered 2017-12-12: 10 mg via ORAL
  Filled 2017-12-11: qty 1

## 2017-12-11 MED ORDER — ACETAMINOPHEN 325 MG PO TABS
650.0000 mg | ORAL_TABLET | Freq: Four times a day (QID) | ORAL | Status: DC | PRN
  Administered 2017-12-11 – 2017-12-12 (×4): 650 mg via ORAL
  Filled 2017-12-11 (×4): qty 2

## 2017-12-11 MED ORDER — SODIUM CHLORIDE 0.9 % IV SOLN
INTRAVENOUS | Status: DC
  Administered 2017-12-11: 04:00:00 via INTRAVENOUS

## 2017-12-11 MED ORDER — ACETAMINOPHEN 650 MG RE SUPP: 650.0000 mg | Freq: Four times a day (QID) | RECTAL | Status: DC | PRN

## 2017-12-11 MED ORDER — NEBIVOLOL HCL 10 MG PO TABS
5.0000 mg | ORAL_TABLET | ORAL | Status: AC
  Administered 2017-12-11: 5 mg via ORAL
  Filled 2017-12-11: qty 1

## 2017-12-11 MED ORDER — ASPIRIN EC 81 MG PO TBEC
81.0000 mg | DELAYED_RELEASE_TABLET | Freq: Every day | ORAL | Status: DC
  Administered 2017-12-11 – 2017-12-12 (×2): 81 mg via ORAL
  Filled 2017-12-11 (×2): qty 1

## 2017-12-11 MED ORDER — NEBIVOLOL HCL 10 MG PO TABS
5.0000 mg | ORAL_TABLET | Freq: Every day | ORAL | Status: DC
  Administered 2017-12-11: 5 mg via ORAL
  Filled 2017-12-11: qty 1

## 2017-12-11 MED ORDER — PANTOPRAZOLE SODIUM 40 MG PO TBEC
40.0000 mg | DELAYED_RELEASE_TABLET | Freq: Every day | ORAL | Status: DC
  Administered 2017-12-11 – 2017-12-12 (×2): 40 mg via ORAL
  Filled 2017-12-11 (×2): qty 1

## 2017-12-11 MED ORDER — CLOPIDOGREL BISULFATE 75 MG PO TABS
75.0000 mg | ORAL_TABLET | Freq: Every day | ORAL | Status: DC
  Administered 2017-12-11 – 2017-12-12 (×2): 75 mg via ORAL
  Filled 2017-12-11 (×2): qty 1

## 2017-12-11 MED ORDER — ONDANSETRON HCL 4 MG/2ML IJ SOLN
4.0000 mg | Freq: Four times a day (QID) | INTRAMUSCULAR | Status: DC | PRN
  Administered 2017-12-11: 4 mg via INTRAVENOUS
  Filled 2017-12-11: qty 2

## 2017-12-11 NOTE — ED Provider Notes (Signed)
Pt received at sign out with IVF infusing. Pt presented to ED c/o HTN despite taking meds for same. IV hydralazine given with drop in SBP from 210 to 130.  Pt hemeconcentrated, BUN/Cr mildly elevated. Pt now feels lightheaded during orthostatic VS, dry heaving, "dizzy" (lightheaded) when attempting to ambulate. Does not feel she can go home safely. Dx and testing d/w pt and family.  Questions answered.  Verb understanding, agreeable to admit. 0245:  T/C returned from [facility] Dr. Ernestina Patches, case discussed, including:  HPI, pertinent PM/SHx, VS/PE, dx testing, ED course and treatment:  Agreeable to admit.    00:38 Orthostatic Vital Signs TE  Orthostatic Lying   BP- Lying: 139/61   Pulse- Lying: 67       Orthostatic Sitting  BP- Sitting: 139/68   Pulse- Sitting: 71       Orthostatic Standing at 0 minutes  BP- Standing at 0 minutes: 129/65   Pulse- Standing at 0 minutes: 75     Results for orders placed or performed during the hospital encounter of 12/10/17  Troponin I  Result Value Ref Range   Troponin I <0.03 <0.03 ng/mL  CBC with Differential  Result Value Ref Range   WBC 6.2 4.0 - 10.5 K/uL   RBC 4.37 3.87 - 5.11 MIL/uL   Hemoglobin 15.4 (H) 12.0 - 15.0 g/dL   HCT 44.3 36.0 - 46.0 %   MCV 101.4 (H) 78.0 - 100.0 fL   MCH 35.2 (H) 26.0 - 34.0 pg   MCHC 34.8 30.0 - 36.0 g/dL   RDW 12.3 11.5 - 15.5 %   Platelets 174 150 - 400 K/uL   Neutrophils Relative % 61 %   Neutro Abs 3.8 1.7 - 7.7 K/uL   Lymphocytes Relative 28 %   Lymphs Abs 1.7 0.7 - 4.0 K/uL   Monocytes Relative 7 %   Monocytes Absolute 0.5 0.1 - 1.0 K/uL   Eosinophils Relative 3 %   Eosinophils Absolute 0.2 0.0 - 0.7 K/uL   Basophils Relative 1 %   Basophils Absolute 0.0 0.0 - 0.1 K/uL  Comprehensive metabolic panel  Result Value Ref Range   Sodium 133 (L) 135 - 145 mmol/L   Potassium 3.7 3.5 - 5.1 mmol/L   Chloride 101 98 - 111 mmol/L   CO2 24 22 - 32 mmol/L   Glucose, Bld 108 (H) 70 - 99 mg/dL   BUN 18 8 - 23  mg/dL   Creatinine, Ser 1.26 (H) 0.44 - 1.00 mg/dL   Calcium 9.4 8.9 - 10.3 mg/dL   Total Protein 7.2 6.5 - 8.1 g/dL   Albumin 4.1 3.5 - 5.0 g/dL   AST 22 15 - 41 U/L   ALT 18 0 - 44 U/L   Alkaline Phosphatase 71 38 - 126 U/L   Total Bilirubin 0.9 0.3 - 1.2 mg/dL   GFR calc non Af Amer 39 (L) >60 mL/min   GFR calc Af Amer 45 (L) >60 mL/min   Anion gap 8 5 - 15  I-stat chem 8, ed  Result Value Ref Range   Sodium 132 (L) 135 - 145 mmol/L   Potassium 5.1 3.5 - 5.1 mmol/L   Chloride 100 98 - 111 mmol/L   BUN 24 (H) 8 - 23 mg/dL   Creatinine, Ser 1.30 (H) 0.44 - 1.00 mg/dL   Glucose, Bld 98 70 - 99 mg/dL   Calcium, Ion 1.15 1.15 - 1.40 mmol/L   TCO2 24 22 - 32 mmol/L   Hemoglobin 15.3 (H)  12.0 - 15.0 g/dL   HCT 45.0 36.0 - 46.0 %   Dg Chest 2 View Result Date: 12/10/2017 CLINICAL DATA:  Initial evaluation for acute hypertensive crisis. EXAM: CHEST - 2 VIEW COMPARISON:  Prior radiograph from 07/24/2012. FINDINGS: Transverse heart size at the upper limits of normal. Mediastinal silhouette within normal limits. Mild aortic atherosclerosis. The lungs are normally inflated. No airspace consolidation, pleural effusion, or pulmonary edema is identified. There is no pneumothorax. No acute osseous abnormality identified. IMPRESSION: 1. No active cardiopulmonary disease. 2. Aortic atherosclerosis. Electronically Signed   By: Jeannine Boga M.D.   On: 12/10/2017 22:48   Ct Head Wo Contrast Result Date: 12/10/2017 CLINICAL DATA:  Altered level of consciousness. Headache today and rising blood pressure. EXAM: CT HEAD WITHOUT CONTRAST TECHNIQUE: Contiguous axial images were obtained from the base of the skull through the vertex without intravenous contrast. COMPARISON:  11/14/2015 FINDINGS: Brain: Mild diffuse cerebral atrophy. Patchy low-attenuation changes in the deep white matter consistent small vessel ischemia. No mass effect or midline shift. No abnormal extra-axial fluid collections. Gray-white  matter junctions are distinct. Basal cisterns are not effaced. No acute intracranial hemorrhage. Vascular: Intracranial arterial vascular calcifications are present. Skull: Normal. Negative for fracture or focal lesion. Sinuses/Orbits: No acute finding. Other: None. IMPRESSION: No acute intracranial abnormalities. Mild chronic atrophy and small vessel ischemic changes. Electronically Signed   By: Lucienne Capers M.D.   On: 12/10/2017 22:42      Francine Graven, DO 12/11/17 [phone]

## 2017-12-11 NOTE — ED Notes (Signed)
Pt ambulated to nurses station with assistance; pt states she feels a little dizzy with walking

## 2017-12-11 NOTE — H&P (Signed)
History and Physical    Allison Morris IRW:[id number] DOB: 1935/07/09 DOA: 12/10/2017  Referring MD/NP/PA: Thurnell Garbe  PCP: Redmond School, MD  Outpatient Specialists: None   Patient coming from: Home  Chief Complaint:  Hypertension  Orthostatic Hypotension   HPI: Allison Morris is a 82 y.o. female with medical history significant of HTN, aortic dilatation presenting e/ hypertension/orthostatic hypotension. Per the pt and daughter, pt w/ headache over past 1-2 days. Baseline HTN on bystolic. No missed doses. Pt took advil for HA. Per pt, that made HA feel better. Pt w/ noted elevated BP at home. No hemiparesis, confusion, CP, SOB. No numbness.  ED Course: Presented to ER w/ SBPs in 200s. Was given 5mg  IV hydralazine per report w/ subsequent drop in SBPs in 130s. Pt w/ weakness and nausea s/p IV hydralazine. Orthostatic positive per report. Hgb 15. WBC WNL. CXR, CT head WNL.   Review of Systems: As per HPI otherwise 10 point review of systems negative.    Past Medical History:  Diagnosis Date  . Aortic dilatation (HCC)   . Aortic regurgitation   . Essential hypertension   . History of DVT (deep vein thrombosis)    Left leg 1997   . Hyperlipemia     Past Surgical History:  Procedure Laterality Date  . APPENDECTOMY    . CARPAL TUNNEL RELEASE    . TONSILLECTOMY       reports that she has never smoked. She has never used smokeless tobacco. She reports that she does not drink alcohol or use drugs.  Allergies  Allergen Reactions  . Biaxin [Clarithromycin]     headache  . Doxycycline     Cramps   . Estrogens     cramps  . Macrodantin [Nitrofurantoin]   . Statins     cramps  . Ceftin [Cefuroxime] Palpitations and Other (See Comments)    Rapid heart beat redness  . Ciprofloxacin Rash  . Sulfa Antibiotics Itching    REDNESS    Family History  Problem Relation Age of Onset  . Aneurysm Sister   . Valvular heart disease Sister   . Aneurysm Paternal Grandfather   .  Heart attack Paternal Grandfather     Prior to Admission medications   Medication Sig Start Date End Date Taking? Authorizing Provider  nebivolol (BYSTOLIC) 5 MG tablet Take 5-10 mg by mouth daily.    Yes [provider]  acetaminophen (TYLENOL) 500 MG tablet Take 500 mg by mouth every 6 (six) hours as needed for mild pain or moderate pain.     [provider]  aspirin EC 81 MG EC tablet Take 1 tablet (81 mg total) by mouth daily. 11/15/15   Orvan Falconer, MD  Cholecalciferol (VITAMIN D3) 1000 units CAPS Take 1 capsule by mouth daily.     [provider]  clobetasol cream (TEMOVATE) 7.61 % Apply 1 application topically daily as needed (for irritation).     [provider]  clopidogrel (PLAVIX) 75 MG tablet Take 1 tablet (75 mg total) by mouth daily. 11/15/15   Orvan Falconer, MD  colchicine 0.6 MG tablet Take 0.6 mg by mouth. 1/2 tablet daily    [provider]  fexofenadine (ALLEGRA) 180 MG tablet Take 180 mg by mouth daily.    [provider]  fluticasone (FLONASE) 50 MCG/ACT nasal spray Place 1 spray into both nostrils daily.     [provider]  Glucosamine-Chondroit-Vit C-Mn (GLUCOSAMINE 1500 COMPLEX PO) Take 1 tablet by mouth daily.  [provider]  Omega-3 Fatty Acids (FISH OIL) 1200 MG CAPS Take 1 capsule by mouth 2 (two) times daily.     [provider]  pantoprazole (PROTONIX) 40 MG tablet Take 40 mg by mouth daily.     [provider]    Physical Exam: Vitals:   12/11/17 0100 12/11/17 0130 12/11/17 0200 12/11/17 0230  BP: (!) 148/70 (!) 160/73 (!) 164/77 (!) 156/62  Pulse: 67 67 65 66  Resp: 16 15 13 14   Temp:      TempSrc:      SpO2: 97% 98% 99% 97%  Weight:          Constitutional: NAD, calm, comfortable Vitals:   12/11/17 0100 12/11/17 0130 12/11/17 0200 12/11/17 0230  BP: (!) 148/70 (!) 160/73 (!) 164/77 (!) 156/62  Pulse: 67 67 65 66  Resp: 16 15 13 14   Temp:      TempSrc:        SpO2: 97% 98% 99% 97%  Weight:       Eyes: PERRL, lids and conjunctivae normal ENMT: Mucous membranes are moist. Posterior pharynx clear of any exudate or lesions.Normal dentition.  Neck: normal, supple, no masses, no thyromegaly Respiratory: clear to auscultation bilaterally, no wheezing, no crackles. Normal respiratory effort. No accessory muscle use.  Cardiovascular: Regular rate and rhythm, no murmurs / rubs / gallops. No extremity edema. 2+ pedal pulses. No carotid bruits.  Abdomen: no tenderness, no masses palpated. No hepatosplenomegaly. Bowel sounds positive.  Musculoskeletal: no clubbing / cyanosis. No joint deformity upper and lower extremities. Good ROM, no contractures. Normal muscle tone.  Skin: no rashes, lesions, ulcers. No induration Neurologic: CN 2-12 grossly intact. Sensation intact, DTR normal. Strength 5/5 in all 4.  Psychiatric: Normal judgment and insight. Alert and oriented x 3. Normal mood.    Labs on Admission: I have personally reviewed following labs and imaging studies  CBC: Recent Labs  Lab 12/10/17 2203 12/10/17 2238  WBC 6.2  --   NEUTROABS 3.8  --   HGB 15.4* 15.3*  HCT 44.3 45.0  MCV 101.4*  --   PLT 174  --    Basic Metabolic Panel: Recent Labs  Lab 12/10/17 2203 12/10/17 2238  NA 133* 132*  K 3.7 5.1  CL 101 100  CO2 24  --   GLUCOSE 108* 98  BUN 18 24*  CREATININE 1.26* 1.30*  CALCIUM 9.4  --    GFR: Estimated Creatinine Clearance: 32.3 mL/min (A) (by C-G formula based on SCr of 1.3 mg/dL (H)). Liver Function Tests: Recent Labs  Lab 12/10/17 2203  AST 22  ALT 18  ALKPHOS 71  BILITOT 0.9  PROT 7.2  ALBUMIN 4.1   No results for input(s): LIPASE, AMYLASE in the last 168 hours. No results for input(s): AMMONIA in the last 168 hours. Coagulation Profile: No results for input(s): INR, PROTIME in the last 168 hours. Cardiac Enzymes: Recent Labs  Lab 12/10/17 2203  TROPONINI <0.03   BNP (last 3 results) No results for  input(s): PROBNP in the last 8760 hours. HbA1C: No results for input(s): HGBA1C in the last 72 hours. CBG: No results for input(s): GLUCAP in the last 168 hours. Lipid Profile: No results for input(s): CHOL, HDL, LDLCALC, TRIG, CHOLHDL, LDLDIRECT in the last 72 hours. Thyroid Function Tests: No results for input(s): TSH, T4TOTAL, FREET4, T3FREE, THYROIDAB in the last 72 hours. Anemia Panel: No results for input(s): VITAMINB12, FOLATE, FERRITIN, TIBC, IRON, RETICCTPCT in the last 72 hours.  Urine analysis:    Component Value Date/Time   COLORURINE YELLOW 11/14/2015 1825   APPEARANCEUR CLEAR 11/14/2015 1825   LABSPEC 1.015 11/14/2015 1825   PHURINE 5.5 11/14/2015 1825   GLUCOSEU NEGATIVE 11/14/2015 1825   HGBUR NEGATIVE 11/14/2015 1825   BILIRUBINUR NEGATIVE 11/14/2015 1825   KETONESUR NEGATIVE 11/14/2015 1825   PROTEINUR NEGATIVE 11/14/2015 1825   NITRITE NEGATIVE 11/14/2015 1825   LEUKOCYTESUR SMALL (A) 11/14/2015 1825   Sepsis Labs: @LABRCNTIP (procalcitonin:4,lacticidven:4) )No results found for this or any previous visit (from the past 240 hour(s)).   Radiological Exams on Admission: Dg Chest 2 View  Result Date: 12/10/2017 CLINICAL DATA:  Initial evaluation for acute hypertensive crisis. EXAM: CHEST - 2 VIEW COMPARISON:  Prior radiograph from 07/24/2012. FINDINGS: Transverse heart size at the upper limits of normal. Mediastinal silhouette within normal limits. Mild aortic atherosclerosis. The lungs are normally inflated. No airspace consolidation, pleural effusion, or pulmonary edema is identified. There is no pneumothorax. No acute osseous abnormality identified. IMPRESSION: 1. No active cardiopulmonary disease. 2. Aortic atherosclerosis. Electronically Signed   By: Jeannine Boga M.D.   On: 12/10/2017 22:48   Ct Head Wo Contrast  Result Date: 12/10/2017 CLINICAL DATA:  Altered level of consciousness. Headache today and rising blood pressure. EXAM: CT HEAD WITHOUT CONTRAST  TECHNIQUE: Contiguous axial images were obtained from the base of the skull through the vertex without intravenous contrast. COMPARISON:  11/14/2015 FINDINGS: Brain: Mild diffuse cerebral atrophy. Patchy low-attenuation changes in the deep white matter consistent small vessel ischemia. No mass effect or midline shift. No abnormal extra-axial fluid collections. Gray-white matter junctions are distinct. Basal cisterns are not effaced. No acute intracranial hemorrhage. Vascular: Intracranial arterial vascular calcifications are present. Skull: Normal. Negative for fracture or focal lesion. Sinuses/Orbits: No acute finding. Other: None. IMPRESSION: No acute intracranial abnormalities. Mild chronic atrophy and small vessel ischemic changes. Electronically Signed   By: Lucienne Capers M.D.   On: 12/10/2017 22:42    EKG: Independently reviewed. Sinus rhythm  Assessment/Plan Active Problems:   Essential hypertension   Orthostatic hypotension   1-HTN/Orthostatic Hypotension -orthostatic hypotension 2/2 medication in ER  -pt clinically improving w/ IVF  -will defer any antihypertensive treatment for now and follow clinically  -no focal neuro sxs  -avoid NSAIDs  -serial orthostatics  -follow   2-Nausea/vomiting  -likely secondary to above  -clinically dry  -IVF  -antiemetics  3-hx/o TIA  -non focal neuro exam  -CT head WNL  -cont asa and plavix  -follow   DVT prophylaxis: SQH  Code Status: Full Code   Family Communication: Daughter at bedside   Disposition Plan: Pending further evaluation   Consults called: None   Admission status: Obs- med tele     Deneise Lever MD [facility] Pager [phone]  If 7PM-7AM, please contact night-coverage [email or web address] Password [email or web address]  12/11/2017, 3:10 AM

## 2017-12-11 NOTE — ED Notes (Signed)
Report given to nicole RN on 300

## 2017-12-11 NOTE — Progress Notes (Signed)
Per Valentino Nose, Agricultural consultant, patient c/o SOB and chest tightness while up to bathroom. O2 2L [place] applied at that time. Patient states she feels better now while resting in bed.

## 2017-12-11 NOTE — Progress Notes (Signed)
Patient admitted to the hospital earlier this morning by Dr. Ernestina Patches  Patient seen and examined. She continues to have headache. Had chest pain earlier this morning. Neuro exam is nonfocal.   Patient admitted to the hospital with headache, lightheadedness and orthostasis. She was noted to be very hypertensive on arrival, but also had some orthostasis on standing. She was likely mildly volume depleted. Started on IV fluids. Blood pressures remain elevated today. Will restart her bystolic. She had some chest pain earlier today. EKG is unremarkable and initial troponin was negative. Will repeat troponin. Get physical therapy evaluation. She continues to have headache that should improve as blood pressure stabilizes. Anticipate discharge in next 24 hours.  [employer]

## 2017-12-12 DIAGNOSIS — I10 Essential (primary) hypertension: Secondary | ICD-10-CM

## 2017-12-12 LAB — COMPREHENSIVE METABOLIC PANEL
ALBUMIN: 3.4 g/dL — AB (ref 3.5–5.0)
ALT: 15 U/L (ref 0–44)
AST: 17 U/L (ref 15–41)
Alkaline Phosphatase: 48 U/L (ref 38–126)
Anion gap: 7 (ref 5–15)
BUN: 12 mg/dL (ref 8–23)
CHLORIDE: 103 mmol/L (ref 98–111)
CO2: 24 mmol/L (ref 22–32)
CREATININE: 1.25 mg/dL — AB (ref 0.44–1.00)
Calcium: 9.1 mg/dL (ref 8.9–10.3)
GFR calc Af Amer: 46 mL/min — ABNORMAL LOW (ref >60)
GFR calc non Af Amer: 40 mL/min — ABNORMAL LOW (ref >60)
GLUCOSE: 94 mg/dL (ref 70–99)
Potassium: 4.4 mmol/L (ref 3.5–5.1)
Sodium: 134 mmol/L — ABNORMAL LOW (ref 135–145)
Total Bilirubin: 1.3 mg/dL — ABNORMAL HIGH (ref 0.3–1.2)
Total Protein: 6 g/dL — ABNORMAL LOW (ref 6.5–8.1)

## 2017-12-12 LAB — CBC WITH DIFFERENTIAL/PLATELET
BASOS PCT: 1 %
Basophils Absolute: 0 10*3/uL (ref 0.0–0.1)
EOS ABS: 0.2 10*3/uL (ref 0.0–0.7)
Eosinophils Relative: 3 %
HEMATOCRIT: 40.6 % (ref 36.0–46.0)
Hemoglobin: 14.1 g/dL (ref 12.0–15.0)
LYMPHS ABS: 1.8 10*3/uL (ref 0.7–4.0)
Lymphocytes Relative: 31 %
MCH: 35.1 pg — AB (ref 26.0–34.0)
MCHC: 34.7 g/dL (ref 30.0–36.0)
MCV: 101 fL — ABNORMAL HIGH (ref 78.0–100.0)
MONO ABS: 0.5 10*3/uL (ref 0.1–1.0)
MONOS PCT: 9 %
NEUTROS PCT: 56 %
Neutro Abs: 3.2 10*3/uL (ref 1.7–7.7)
Platelets: 162 10*3/uL (ref 150–400)
RBC: 4.02 MIL/uL (ref 3.87–5.11)
RDW: 12.2 % (ref 11.5–15.5)
WBC: 5.7 10*3/uL (ref 4.0–10.5)

## 2017-12-12 MED ORDER — NEBIVOLOL HCL 10 MG PO TABS: 10.0000 mg | ORAL_TABLET | Freq: Every day | ORAL | 0 refills | Status: DC

## 2017-12-12 NOTE — Care Management Obs Status (Signed)
[facility] NOTIFICATION   Patient Details  Name: JANAE BONSER MRN: [id number] Date of Birth: September 20, 1935   Medicare Observation Status Notification Given:  Yes    Dailey Alberson, Chauncey Reading, RN 12/12/2017, 12:42 PM

## 2017-12-12 NOTE — Progress Notes (Signed)
Discharge instructions gone over with patient and daughter, verbalized understanding. IV removed, patient tolerated procedure well. 

## 2017-12-12 NOTE — Discharge Summary (Signed)
Physician Discharge Summary  Allison Morris OIN:[id number] DOB: May 02, 1936 DOA: 12/10/2017  PCP: Redmond School, MD  Admit date: 12/10/2017  Discharge date: 12/12/2017  Admitted From:Home  Disposition:  Home  Recommendations for Outpatient Follow-up:  1. Follow up with PCP in 1-2 weeks  Home Health:N/A  Equipment/Devices:N/A  Discharge Condition:Stable  CODE STATUS: Full  Diet recommendation: Heart Healthy  Brief/Interim Summary:  Per HPI from Dr. Ernestina Patches:  HPI: Allison Morris is a 82 y.o. female with medical history significant of HTN, aortic dilatation presenting e/ hypertension/orthostatic hypotension. Per the pt and daughter, pt w/ headache over past 1-2 days. Baseline HTN on bystolic. No missed doses. Pt took advil for HA. Per pt, that made HA feel better. Pt w/ noted elevated BP at home. No hemiparesis, confusion, CP, SOB. No numbness.  ED Course: Presented to ER w/ SBPs in 200s. Was given 5mg  IV hydralazine per report w/ subsequent drop in SBPs in 130s. Pt w/ weakness and nausea s/p IV hydralazine. Orthostatic positive per report. Hgb 15. WBC WNL. CXR, CT head WNL.   Patient was noted to have some severe hypertension that resolved with treatment with IV hydralazine in the ED.  She actually became quite weak and orthostatic after using this medication but she is otherwise feeling much better at this point in time and is tolerating a diet.  Recheck on orthostatics were performed with no significant findings and she ambulated in the hallways and remained asymptomatic.  Discharge Diagnoses:  Active Problems:   Hypertension   Orthostatic lightheadedness  1. Orthostatic hypotension after treatment for uncontrolled hypertension.  She is clinically improved and has no further symptomatology.  She has no further orthostasis and ambulates without any symptomatology at this time.  Continue home medications as previously prescribed. 2. Nausea and vomiting secondary to above.  She is  euvolemic now and tolerating diet.  No further symptoms. 3. History of TIA.  Focal neurological exam with CT head within normal limits.  Continue home aspirin and Plavix.   Discharge Instructions  Discharge Instructions    Diet - low sodium heart healthy   Complete by:  As directed    Increase activity slowly   Complete by:  As directed      Allergies as of 12/12/2017      Reactions   Biaxin [clarithromycin]    headache   Doxycycline    Cramps   Estrogens    cramps   Macrodantin [nitrofurantoin]    Statins    cramps   Ceftin [cefuroxime] Palpitations, Other (See Comments)   Rapid heart beat redness   Ciprofloxacin Rash   Sulfa Antibiotics Itching   REDNESS      Medication List    TAKE these medications   acetaminophen 500 MG tablet Commonly known as:  TYLENOL Take 500 mg by mouth every 6 (six) hours as needed for mild pain or moderate pain.   allopurinol 100 MG tablet Commonly known as:  ZYLOPRIM Take 50 mg by mouth daily.   aspirin 81 MG EC tablet Take 1 tablet (81 mg total) by mouth daily.   clobetasol cream 0.05 % Commonly known as:  TEMOVATE Apply 1 application topically daily as needed (for irritation).   clopidogrel 75 MG tablet Commonly known as:  PLAVIX Take 1 tablet (75 mg total) by mouth daily.   colchicine 0.6 MG tablet Take 0.6 mg by mouth. 1/2 tablet daily   fexofenadine 180 MG tablet Commonly known as:  ALLEGRA Take 180 mg by mouth daily.  Fish Oil 1200 MG Caps Take 1 capsule by mouth 2 (two) times daily.   fluticasone 50 MCG/ACT nasal spray Commonly known as:  FLONASE Place 2 sprays into both nostrils daily.   GLUCOSAMINE 1500 COMPLEX PO Take 1 tablet by mouth daily.   nebivolol 10 MG tablet Commonly known as:  BYSTOLIC Take 1 tablet (10 mg total) by mouth daily. Start taking on:  12/13/2017 What changed:  how much to take   pantoprazole 40 MG tablet Commonly known as:  PROTONIX Take 40 mg by mouth daily.   Vitamin D3 1000  units Caps Take 1 capsule by mouth daily.      Follow-up Information    Redmond School, MD On 12/19/2017.   Specialty:  Internal Medicine Why:  at 2:45 pm Contact information: [address] [place] [address] [phone]          Allergies  Allergen Reactions  . Biaxin [Clarithromycin]     headache  . Doxycycline     Cramps   . Estrogens     cramps  . Macrodantin [Nitrofurantoin]   . Statins     cramps  . Ceftin [Cefuroxime] Palpitations and Other (See Comments)    Rapid heart beat redness  . Ciprofloxacin Rash  . Sulfa Antibiotics Itching    REDNESS    Consultations:  None   Procedures/Studies: Dg Chest 2 View  Result Date: 12/10/2017 CLINICAL DATA:  Initial evaluation for acute hypertensive crisis. EXAM: CHEST - 2 VIEW COMPARISON:  Prior radiograph from 07/24/2012. FINDINGS: Transverse heart size at the upper limits of normal. Mediastinal silhouette within normal limits. Mild aortic atherosclerosis. The lungs are normally inflated. No airspace consolidation, pleural effusion, or pulmonary edema is identified. There is no pneumothorax. No acute osseous abnormality identified. IMPRESSION: 1. No active cardiopulmonary disease. 2. Aortic atherosclerosis. Electronically Signed   By: Jeannine Boga M.D.   On: 12/10/2017 22:48   Ct Head Wo Contrast  Result Date: 12/10/2017 CLINICAL DATA:  Altered level of consciousness. Headache today and rising blood pressure. EXAM: CT HEAD WITHOUT CONTRAST TECHNIQUE: Contiguous axial images were obtained from the base of the skull through the vertex without intravenous contrast. COMPARISON:  11/14/2015 FINDINGS: Brain: Mild diffuse cerebral atrophy. Patchy low-attenuation changes in the deep white matter consistent small vessel ischemia. No mass effect or midline shift. No abnormal extra-axial fluid collections. Gray-white matter junctions are distinct. Basal cisterns are not effaced. No acute intracranial hemorrhage.  Vascular: Intracranial arterial vascular calcifications are present. Skull: Normal. Negative for fracture or focal lesion. Sinuses/Orbits: No acute finding. Other: None. IMPRESSION: No acute intracranial abnormalities. Mild chronic atrophy and small vessel ischemic changes. Electronically Signed   By: Lucienne Capers M.D.   On: 12/10/2017 22:42   Discharge Exam: Vitals:   12/12/17 0130 12/12/17 0547  BP: (!) 148/60 (!) 159/62  Pulse: (!) 55 (!) 51  Resp: 16 18  Temp: 98.1 F (36.7 C) 98.3 F (36.8 C)  SpO2: 94% 95%   Vitals:   12/11/17 1644 12/11/17 2154 12/12/17 0130 12/12/17 0547  BP: (!) 167/63 (!) 135/55 (!) 148/60 (!) 159/62  Pulse: (!) 59 (!) 56 (!) 55 (!) 51  Resp: 18 16 16 18   Temp:  98.4 F (36.9 C) 98.1 F (36.7 C) 98.3 F (36.8 C)  TempSrc:  Oral Oral Oral  SpO2: 95% 96% 94% 95%  Weight:      Height:        General: Pt is alert, awake, not in acute distress Cardiovascular: RRR,  S1/S2 +, no rubs, no gallops Respiratory: CTA bilaterally, no wheezing, no rhonchi Abdominal: Soft, NT, ND, bowel sounds + Extremities: no edema, no cyanosis    The results of significant diagnostics from this hospitalization (including imaging, microbiology, ancillary and laboratory) are listed below for reference.     Microbiology: No results found for this or any previous visit (from the past 240 hour(s)).   Labs: BNP (last 3 results) No results for input(s): BNP in the last 8760 hours. Basic Metabolic Panel: Recent Labs  Lab 12/10/17 2203 12/10/17 2238 12/11/17 0629 12/12/17 0614  NA 133* 132* 134* 134*  K 3.7 5.1 3.5 4.4  CL 101 100 102 103  CO2 24  --  23 24  GLUCOSE 108* 98 104* 94  BUN 18 24* 14 12  CREATININE 1.26* 1.30* 1.02* 1.25*  CALCIUM 9.4  --  8.8* 9.1   Liver Function Tests: Recent Labs  Lab 12/10/17 2203 12/11/17 0629 12/12/17 0614  AST 22 17 17   ALT 18 16 15   ALKPHOS 71 53 48  BILITOT 0.9 1.3* 1.3*  PROT 7.2 6.3* 6.0*  ALBUMIN 4.1 3.5 3.4*    No results for input(s): LIPASE, AMYLASE in the last 168 hours. No results for input(s): AMMONIA in the last 168 hours. CBC: Recent Labs  Lab 12/10/17 2203 12/10/17 2238 12/11/17 0629 12/12/17 0614  WBC 6.2  --  6.8 5.7  NEUTROABS 3.8  --  5.3 3.2  HGB 15.4* 15.3* 13.9 14.1  HCT 44.3 45.0 40.9 40.6  MCV 101.4*  --  101.2* 101.0*  PLT 174  --  163 162   Cardiac Enzymes: Recent Labs  Lab 12/10/17 2203 12/11/17 1213  TROPONINI <0.03 <0.03   BNP: Invalid input(s): POCBNP CBG: No results for input(s): GLUCAP in the last 168 hours. D-Dimer No results for input(s): DDIMER in the last 72 hours. Hgb A1c No results for input(s): HGBA1C in the last 72 hours. Lipid Profile No results for input(s): CHOL, HDL, LDLCALC, TRIG, CHOLHDL, LDLDIRECT in the last 72 hours. Thyroid function studies No results for input(s): TSH, T4TOTAL, T3FREE, THYROIDAB in the last 72 hours.  Invalid input(s): FREET3 Anemia work up No results for input(s): VITAMINB12, FOLATE, FERRITIN, TIBC, IRON, RETICCTPCT in the last 72 hours. Urinalysis    Component Value Date/Time   COLORURINE YELLOW 11/14/2015 1825   APPEARANCEUR CLEAR 11/14/2015 1825   LABSPEC 1.015 11/14/2015 1825   PHURINE 5.5 11/14/2015 1825   GLUCOSEU NEGATIVE 11/14/2015 1825   HGBUR NEGATIVE 11/14/2015 1825   BILIRUBINUR NEGATIVE 11/14/2015 1825   KETONESUR NEGATIVE 11/14/2015 1825   PROTEINUR NEGATIVE 11/14/2015 1825   NITRITE NEGATIVE 11/14/2015 1825   LEUKOCYTESUR SMALL (A) 11/14/2015 1825   Sepsis Labs Invalid input(s): PROCALCITONIN,  WBC,  LACTICIDVEN Microbiology No results found for this or any previous visit (from the past 240 hour(s)).   Time coordinating discharge: 40 minutes  SIGNED:   Rodena Goldmann, DO [facility] 12/12/2017, 1:40 PM Pager [phone]  If 7PM-7AM, please contact night-coverage [email or web address] Password [email or web address]

## 2017-12-12 NOTE — Progress Notes (Signed)
Patient was 143/68 and 57 sitting on the side of the bed. Standing patient dropped to 129/69 and 63. Patient reported feeling no symptoms. I walked the patient approximately 300 feet, patient continued to report no symptoms. Alerted MD, said to proceed with discharge.

## 2017-12-15 DIAGNOSIS — M1991 Primary osteoarthritis, unspecified site: Secondary | ICD-10-CM | POA: Diagnosis not present

## 2017-12-15 DIAGNOSIS — K219 Gastro-esophageal reflux disease without esophagitis: Secondary | ICD-10-CM | POA: Diagnosis not present

## 2017-12-15 DIAGNOSIS — I10 Essential (primary) hypertension: Secondary | ICD-10-CM | POA: Diagnosis not present

## 2017-12-15 DIAGNOSIS — I959 Hypotension, unspecified: Secondary | ICD-10-CM | POA: Diagnosis not present

## 2017-12-15 DIAGNOSIS — G459 Transient cerebral ischemic attack, unspecified: Secondary | ICD-10-CM | POA: Diagnosis not present

## 2017-12-15 DIAGNOSIS — E063 Autoimmune thyroiditis: Secondary | ICD-10-CM | POA: Diagnosis not present

## 2017-12-15 DIAGNOSIS — Z6823 Body mass index (BMI) 23.0-23.9, adult: Secondary | ICD-10-CM | POA: Diagnosis not present

## 2017-12-16 ENCOUNTER — Encounter (HOSPITAL_COMMUNITY): Payer: Self-pay

## 2017-12-16 ENCOUNTER — Emergency Department (HOSPITAL_COMMUNITY)
Admission: EM | Admit: 2017-12-16 | Discharge: 2017-12-16 | Disposition: A | Discharge status: Home / Self Care | Payer: PPO | Attending: Emergency Medicine | Admitting: Emergency Medicine

## 2017-12-16 DIAGNOSIS — T50905A Adverse effect of unspecified drugs, medicaments and biological substances, initial encounter: Secondary | ICD-10-CM | POA: Diagnosis not present

## 2017-12-16 DIAGNOSIS — R0789 Other chest pain: Secondary | ICD-10-CM | POA: Diagnosis not present

## 2017-12-16 DIAGNOSIS — M1991 Primary osteoarthritis, unspecified site: Secondary | ICD-10-CM | POA: Diagnosis not present

## 2017-12-16 DIAGNOSIS — I10 Essential (primary) hypertension: Secondary | ICD-10-CM | POA: Diagnosis not present

## 2017-12-16 DIAGNOSIS — Z79899 Other long term (current) drug therapy: Secondary | ICD-10-CM | POA: Diagnosis not present

## 2017-12-16 DIAGNOSIS — R079 Chest pain, unspecified: Secondary | ICD-10-CM | POA: Diagnosis not present

## 2017-12-16 DIAGNOSIS — Z6823 Body mass index (BMI) 23.0-23.9, adult: Secondary | ICD-10-CM | POA: Diagnosis not present

## 2017-12-16 DIAGNOSIS — Z7902 Long term (current) use of antithrombotics/antiplatelets: Secondary | ICD-10-CM | POA: Insufficient documentation

## 2017-12-16 DIAGNOSIS — Z7982 Long term (current) use of aspirin: Secondary | ICD-10-CM | POA: Insufficient documentation

## 2017-12-16 DIAGNOSIS — Z8673 Personal history of transient ischemic attack (TIA), and cerebral infarction without residual deficits: Secondary | ICD-10-CM | POA: Insufficient documentation

## 2017-12-16 DIAGNOSIS — G909 Disorder of the autonomic nervous system, unspecified: Secondary | ICD-10-CM | POA: Diagnosis not present

## 2017-12-16 LAB — CBC WITH DIFFERENTIAL/PLATELET
BASOS ABS: 0 10*3/uL (ref 0.0–0.1)
Basophils Relative: 1 %
EOS PCT: 2 %
Eosinophils Absolute: 0.1 10*3/uL (ref 0.0–0.7)
HCT: 39.5 % (ref 36.0–46.0)
HEMOGLOBIN: 13.4 g/dL (ref 12.0–15.0)
LYMPHS PCT: 19 %
Lymphs Abs: 1.2 10*3/uL (ref 0.7–4.0)
MCH: 34.6 pg — ABNORMAL HIGH (ref 26.0–34.0)
MCHC: 33.9 g/dL (ref 30.0–36.0)
MCV: 102.1 fL — ABNORMAL HIGH (ref 78.0–100.0)
Monocytes Absolute: 0.5 10*3/uL (ref 0.1–1.0)
Monocytes Relative: 9 %
NEUTROS ABS: 4.2 10*3/uL (ref 1.7–7.7)
NEUTROS PCT: 69 %
PLATELETS: 168 10*3/uL (ref 150–400)
RBC: 3.87 MIL/uL (ref 3.87–5.11)
RDW: 12.4 % (ref 11.5–15.5)
WBC: 6.1 10*3/uL (ref 4.0–10.5)

## 2017-12-16 LAB — I-STAT TROPONIN, ED: TROPONIN I, POC: 0.01 ng/mL (ref 0.00–0.08)

## 2017-12-16 LAB — BASIC METABOLIC PANEL
ANION GAP: 7 (ref 5–15)
BUN: 21 mg/dL (ref 8–23)
CHLORIDE: 104 mmol/L (ref 98–111)
CO2: 25 mmol/L (ref 22–32)
Calcium: 9.3 mg/dL (ref 8.9–10.3)
Creatinine, Ser: 1.27 mg/dL — ABNORMAL HIGH (ref 0.44–1.00)
GFR calc Af Amer: 45 mL/min — ABNORMAL LOW (ref >60)
GFR, EST NON AFRICAN AMERICAN: 39 mL/min — AB (ref >60)
GLUCOSE: 111 mg/dL — AB (ref 70–99)
POTASSIUM: 4.9 mmol/L (ref 3.5–5.1)
Sodium: 136 mmol/L (ref 135–145)

## 2017-12-16 MED ORDER — CLONIDINE HCL 0.1 MG PO TABS
0.1000 mg | ORAL_TABLET | Freq: Once | ORAL | Status: AC
  Administered 2017-12-16: 0.1 mg via ORAL
  Filled 2017-12-16: qty 1

## 2017-12-16 NOTE — ED Triage Notes (Signed)
Pt states she awoke to go to the bathroom and was having pain to her left lateral ribcage and around to her back, states she just started a new bp med yesterday. When she checked her bp at home it was 201/80

## 2017-12-16 NOTE — ED Provider Notes (Signed)
[facility] EMERGENCY DEPARTMENT Provider Note   CSN: [id number] Arrival date & time: 12/16/17  0428     History   Chief Complaint Chief Complaint  Patient presents with  . Hypertension    HPI Allison Morris is a 82 y.o. female.  Patient presents to ER from home with complaints of elevated blood pressure. She got up to go to the bathroom and noticed some pain under left rib area. No shortness of breath. Checked her blood pressure and it was high. Concerned because she was in hospital this week for uncontrolled HTN. No new meds at discharge, but saw her doctor today and had oral hydralazine added. Took a dose, pharmacist told her it might cause chest pain.     Past Medical History:  Diagnosis Date  . Aortic dilatation (HCC)   . Aortic regurgitation   . Essential hypertension   . History of DVT (deep vein thrombosis)    Left leg 1997   . Hyperlipemia     Patient Active Problem List   Diagnosis Date Noted  . Orthostatic lightheadedness 12/11/2017  . TIA (transient ischemic attack) 11/14/2015  . Hypertension   . Hyperlipemia   . History of DVT (deep vein thrombosis)   . Aortic dilatation [facility])     Past Surgical History:  Procedure Laterality Date  . APPENDECTOMY    . CARPAL TUNNEL RELEASE    . TONSILLECTOMY       OB History   None      Home Medications    Prior to Admission medications   Medication Sig Start Date End Date Taking? Authorizing Provider  acetaminophen (TYLENOL) 500 MG tablet Take 500 mg by mouth every 6 (six) hours as needed for mild pain or moderate pain.     [provider]  allopurinol (ZYLOPRIM) 100 MG tablet Take 50 mg by mouth daily.    [provider]  aspirin EC 81 MG EC tablet Take 1 tablet (81 mg total) by mouth daily. 11/15/15   Orvan Falconer, MD  Cholecalciferol (VITAMIN D3) 1000 units CAPS Take 1 capsule by mouth daily.     [provider]  clobetasol cream (TEMOVATE) 7.02 % Apply 1 application topically  daily as needed (for irritation).     [provider]  clopidogrel (PLAVIX) 75 MG tablet Take 1 tablet (75 mg total) by mouth daily. 11/15/15   Orvan Falconer, MD  colchicine 0.6 MG tablet Take 0.6 mg by mouth. 1/2 tablet daily    [provider]  fexofenadine (ALLEGRA) 180 MG tablet Take 180 mg by mouth daily.    [provider]  fluticasone (FLONASE) 50 MCG/ACT nasal spray Place 2 sprays into both nostrils daily.     [provider]  Glucosamine-Chondroit-Vit C-Mn (GLUCOSAMINE 1500 COMPLEX PO) Take 1 tablet by mouth daily.     [provider]  nebivolol (BYSTOLIC) 10 MG tablet Take 1 tablet (10 mg total) by mouth daily. 12/13/17   Manuella Ghazi, Pratik D, DO  Omega-3 Fatty Acids (FISH OIL) 1200 MG CAPS Take 1 capsule by mouth 2 (two) times daily.     [provider]  pantoprazole (PROTONIX) 40 MG tablet Take 40 mg by mouth daily.     [provider]    Family History Family History  Problem Relation Age of Onset  . Aneurysm Sister   . Valvular heart disease Sister   . Aneurysm Paternal Grandfather   . Heart attack Paternal Grandfather     Social History Social  History   Tobacco Use  . Smoking status: Never Smoker  . Smokeless tobacco: Never Used  Substance Use Topics  . Alcohol use: No    Alcohol/week: 0.0 oz  . Drug use: No     Allergies   Biaxin [clarithromycin]; Doxycycline; Estrogens; Macrodantin [nitrofurantoin]; Statins; Ceftin [cefuroxime]; Ciprofloxacin; and Sulfa antibiotics   Review of Systems Review of Systems  Eyes: Positive for visual disturbance.  Respiratory: Negative for shortness of breath.   Cardiovascular: Positive for chest pain.  Neurological: Negative for headaches.  All other systems reviewed and are negative.    Physical Exam Updated Vital Signs BP (!) 146/71   Pulse (!) 53   Temp 97.9 F (36.6 C) (Oral)   Resp 11   Ht 5\' 7"  (1.702 m)   Wt 60.3 kg (133 lb)   SpO2 99%   BMI 20.83 kg/m    Physical Exam  Constitutional: She is oriented to person, place, and time. She appears well-developed and well-nourished. No distress.  HENT:  Head: Normocephalic and atraumatic.  Right Ear: Hearing normal.  Left Ear: Hearing normal.  Nose: Nose normal.  Mouth/Throat: Oropharynx is clear and moist and mucous membranes are normal.  Eyes: Pupils are equal, round, and reactive to light. Conjunctivae and EOM are normal.  Neck: Normal range of motion. Neck supple.  Cardiovascular: Regular rhythm, S1 normal and S2 normal. Exam reveals no gallop and no friction rub.  No murmur heard. Pulmonary/Chest: Effort normal and breath sounds normal. No respiratory distress. She exhibits no tenderness.  Abdominal: Soft. Normal appearance and bowel sounds are normal. There is no hepatosplenomegaly. There is no tenderness. There is no rebound, no guarding, no tenderness at McBurney's point and negative Murphy's sign. No hernia.  Musculoskeletal: Normal range of motion.  Neurological: She is alert and oriented to person, place, and time. She has normal strength. No cranial nerve deficit or sensory deficit. Coordination normal. GCS eye subscore is 4. GCS verbal subscore is 5. GCS motor subscore is 6.  Skin: Skin is warm, dry and intact. No rash noted. No cyanosis.  Psychiatric: She has a normal mood and affect. Her speech is normal and behavior is normal. Thought content normal.  Nursing note and vitals reviewed.    ED Treatments / Results  Labs (all labs ordered are listed, but only abnormal results are displayed) Labs Reviewed  CBC WITH DIFFERENTIAL/PLATELET - Abnormal; Notable for the following components:      Result Value   MCV 102.1 (*)    MCH 34.6 (*)    All other components within normal limits  BASIC METABOLIC PANEL - Abnormal; Notable for the following components:   Glucose, Bld 111 (*)    Creatinine, Ser 1.27 (*)    GFR calc non Af Amer 39 (*)    GFR calc Af Amer 45 (*)    All other  components within normal limits  I-STAT TROPONIN, ED    EKG EKG Interpretation  Date/Time:  Friday December 16 2017 04:40:29 EDT Ventricular Rate:  56 PR Interval:    QRS Duration: 93 QT Interval:  440 QTC Calculation: 425 R Axis:   73 Text Interpretation:  Sinus rhythm Normal ECG Confirmed by Orpah Greek [phone]) on 12/16/2017 4:47:19 AM   Radiology No results found.  Procedures Procedures (including critical care time)  Medications Ordered in ED Medications  cloNIDine (CATAPRES) tablet 0.1 mg (0.1 mg Oral Given 12/16/17 0505)     Initial Impression / Assessment and Plan / ED Course  I have reviewed the triage vital signs and the nursing notes.  Pertinent labs & imaging results that were available during my care of the patient were reviewed by me and considered in my medical decision making (see chart for details).     Patient presents to the ER for evaluation of elevated blood pressure.  She reports that she woke up to go to the bathroom and noticed that she had some pain in the left lateral chest area.  She attributed this to the new hydralazine medicine.  She has had similar types of pains with other blood pressure medicines in the past.  Patient appeared well at arrival.  No difficulty breathing.  No headache or blurred vision.  No neurologic symptoms.  EKG was normal.  She has normal blood work including troponin.    I reviewed the records and she did not tolerate his significant blood pressure drop in the ER last time, required hospitalization.  She was therefore given a low dose clonidine and her blood pressure significantly improved and she very much tolerated this.  Since her blood pressure started coming down, her chest pain resolved.  She is now essentially normotensive.  It sounds like she has been on multiple agents in the past and not tolerated some of them.  And therefore will defer to Dr. Riley Kill.  Patient will call his office this morning for further  instructions on what medications to use for her blood pressure.  Final Clinical Impressions(s) / ED Diagnoses   Final diagnoses:  Essential hypertension    ED Discharge Orders    None       Orpah Greek, MD 12/16/17 [phone]

## 2017-12-19 DIAGNOSIS — N183 Chronic kidney disease, stage 3 (moderate): Secondary | ICD-10-CM | POA: Diagnosis not present

## 2017-12-19 DIAGNOSIS — D631 Anemia in chronic kidney disease: Secondary | ICD-10-CM | POA: Diagnosis not present

## 2017-12-19 DIAGNOSIS — I129 Hypertensive chronic kidney disease with stage 1 through stage 4 chronic kidney disease, or unspecified chronic kidney disease: Secondary | ICD-10-CM | POA: Diagnosis not present

## 2017-12-19 DIAGNOSIS — N2581 Secondary hyperparathyroidism of renal origin: Secondary | ICD-10-CM | POA: Diagnosis not present

## 2017-12-20 ENCOUNTER — Encounter: Payer: Self-pay | Admitting: Physician Assistant

## 2017-12-20 NOTE — Progress Notes (Signed)
Cardiology Office Note    Date:  12/21/2017  ID:  Allison Morris, DOB [birth year], MRN [id number] PCP:  Redmond School, MD  Cardiologist:  Rozann Lesches, MD  Chief Complaint: high BP  History of Present Illness:  Allison Morris is a 82 y.o. female with history of remote L leg DVT 1997, ascending aortic dilation, mild AI, HTN, HLD, CKD stage III, TIA who presents today for evaluation of blood pressure issues. Remote carotid duplex 2017 showed mild carotid plaque <50%. Last echo 08/2017 showed EF 65-70%, grade 1 DD, mild AI, mildly dilated ascending aorta (66mm), mild MR, mild TR.   At last cardiology OV 08/2017 with Dr. Domenic Polite, she was on Bystolic 5mg  BID. BP was satisfactory at that visit but she had reported some higher BPs at home. HR was 49 at that visit so beta blocker was not titrated. At some point in early summer she reports her Bystolic got refilled as 5mg  daily so she began taking that lower dose. At that point her blood pressures began to give her issues. She apparently at one point went back to 10mg  daily but this did not solve the issue. On 7/6-7/9 she was admitted by the hospitalist team for headache for 1-2 days. SBP was 200s in the ER. She was given 5mg  IV hydralazine with drop in BP to 130s with resultant weakness and nausea. Orthostatics were abnormal. She was discharged home to continue Bystolic 10mg  daily. She was seen back in the ER 7/12 with high BP 224/88. She was given a clonidine with improvement in BP to 146/71. EDP notes indicate the patient had been started on hydralazine recently. Pharmacy confirmed dose was 25mg  BID but the patient states her PCP actually told her not to take this since it had dropped her BP so low in the ER in IV form. She has subsequently been just on labetolol 100mg  BID. It was prescribed 200mg  BID but she felt woozy and R leg cramps with the 200mg  dose so self-reduced it to 100mg  BID. She also has had intermittent left lateral chest discomfort both  noted in the ED visit as above (negative troponins), as well as an episode of R sided CP, somewhat sharp, this AM for about an hour. It was not worse with exertion, inspiration or palpation. It resolved on its own. She remains relatively active and has not had any exertional CP. She reports some hand/feet warmth and swelling this AM which is not apparent on my exam. She reports previous issue with lisinopril 20mg  "bottoming her BP out" and amlodipine causing "angina."  She is currently CP free in clinic. No dyspnea.  Last labs 12/2017 showed K 4.9, Cr 1.27 (c/w prior), BUN wnl, CBC OK except MCV elevated, troponin negative, albumin 3.2, TBili 1.3, normal AST/ALT. CXR nonacute. 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  Past Medical History:  Diagnosis Date  . Aortic dilatation (HCC)    a. echo 08/2017 showed EF 65-70%, grade 1 DD, mild AI, mildly dilated ascending aorta (86mm), mild MR, mild TR.   Marland Kitchen Aortic regurgitation   . CKD (chronic kidney disease), stage III ([place])   . Essential hypertension   . History of DVT (deep vein thrombosis)    Left leg 1997   . Hyperlipemia   . Mild aortic insufficiency   . Mild mitral regurgitation   . Mild tricuspid insufficiency     Past Surgical History:  Procedure  Laterality Date  . APPENDECTOMY    . CARPAL TUNNEL RELEASE    . TONSILLECTOMY      Current Medications: Current Meds  Medication Sig  . acetaminophen (TYLENOL) 500 MG tablet Take 500 mg by mouth every 6 (six) hours as needed for mild pain or moderate pain.   Marland Kitchen allopurinol (ZYLOPRIM) 100 MG tablet Take 50 mg by mouth daily.  Marland Kitchen aspirin EC 81 MG EC tablet Take 1 tablet (81 mg total) by mouth daily.  . Cholecalciferol (VITAMIN D3) 1000 units CAPS Take 1 capsule by mouth daily.   . clobetasol cream (TEMOVATE) 0.35 % Apply 1 application topically daily as needed (for irritation).   . clopidogrel (PLAVIX) 75 MG tablet Take 1 tablet (75 mg  total) by mouth daily.  . colchicine 0.6 MG tablet Take 0.6 mg by mouth. 1/2 tablet daily  . fexofenadine (ALLEGRA) 180 MG tablet Take 180 mg by mouth daily.  . fluticasone (FLONASE) 50 MCG/ACT nasal spray Place 2 sprays into both nostrils daily.   . Glucosamine-Chondroit-Vit C-Mn (GLUCOSAMINE 1500 COMPLEX PO) Take 1 tablet by mouth daily.   Marland Kitchen labetalol (NORMODYNE) 200 MG tablet Take 100 mg by mouth daily.  . Omega-3 Fatty Acids (FISH OIL) 1200 MG CAPS Take 1 capsule by mouth 2 (two) times daily.   . pantoprazole (PROTONIX) 40 MG tablet Take 40 mg by mouth daily.      Allergies:   Biaxin [clarithromycin]; Doxycycline; Estrogens; Macrodantin [nitrofurantoin]; Statins; Ceftin [cefuroxime]; Ciprofloxacin; and Sulfa antibiotics   Social History   Socioeconomic History  . Marital status: Married    Spouse name: Not on file  . Number of children: Not on file  . Years of education: Not on file  . Highest education level: Not on file  Occupational History  . Not on file  Social Needs  . Financial resource strain: Not on file  . Food insecurity:    Worry: Not on file    Inability: Not on file  . Transportation needs:    Medical: Not on file    Non-medical: Not on file  Tobacco Use  . Smoking status: Never Smoker  . Smokeless tobacco: Never Used  Substance and Sexual Activity  . Alcohol use: No    Alcohol/week: 0.0 oz  . Drug use: No  . Sexual activity: Not on file  Lifestyle  . Physical activity:    Days per week: Not on file    Minutes per session: Not on file  . Stress: Not on file  Relationships  . Social connections:    Talks on phone: Not on file    Gets together: Not on file    Attends religious service: Not on file    Active member of club or organization: Not on file    Attends meetings of clubs or organizations: Not on file    Relationship status: Not on file  Other Topics Concern  . Not on file  Social History Narrative  . Not on file     Family History:    The patient's family history includes Aneurysm in her paternal grandfather and sister; Heart attack in her paternal grandfather; Valvular heart disease in her sister.  ROS:   Please see the history of present illness.  All other systems are reviewed and otherwise negative.    PHYSICAL EXAM:   VS:  BP (!) 160/86   Pulse (!) 58   Ht 5\' 7"  (1.702 m)   Wt 134 lb (60.8 kg)   SpO2  98%   BMI 20.99 kg/m   BMI: Body mass index is 20.99 kg/m. GEN: Well nourished, well developed WF, in no acute distress HEENT: normocephalic, atraumatic Neck: no JVD, carotid bruits, or masses Cardiac: RRR; no murmurs, rubs, or gallops, no edema. Equal pulses bilaterally Respiratory:  clear to auscultation bilaterally, normal work of breathing GI: soft, nontender, nondistended, + BS MS: no deformity or atrophy Skin: warm and dry, no rash Neuro:  Alert and Oriented x 3, Strength and sensation are intact, follows commands Psych: euthymic mood, full affect  Wt Readings from Last 3 Encounters:  12/21/17 134 lb (60.8 kg)  12/16/17 133 lb (60.3 kg)  12/11/17 135 lb 11.2 oz (61.6 kg)      Studies/Labs Reviewed:   EKG:  EKG was ordered today and personally reviewed by me and demonstrates sinus arrhythmia with nonspecific STT changes similar to prior  Recent Labs: 12/12/2017: ALT 15 12/16/2017: BUN 21; Creatinine, Ser 1.27; Hemoglobin 13.4; Platelets 168; Potassium 4.9; Sodium 136   Lipid Panel    Component Value Date/Time   CHOL 248 (H) 11/15/2015 0542   TRIG 87 11/15/2015 0542   HDL 48 11/15/2015 0542   CHOLHDL 5.2 11/15/2015 0542   VLDL 17 11/15/2015 0542   LDLCALC 183 (H) 11/15/2015 0542    Additional studies/ records that were reviewed today include: Summarized above.   ASSESSMENT & PLAN:   1. Labile HTN - apparently she had been doing well on Bystolic 5mg  BID until recent disruption. When the 10mg  dosing was resumed her BP failed to decrease back to baseline. She was switched to labetalol  for improved BP control per her report, but doesn't feel as good on this. She seems pretty sensitive to medications in general with unusual prior side effects. Will d/c labetalol and switch back to Bystolic 5mg  daily. She did not tolerate amlodipine due to some chest discomfort so will try addition of felodipine 5mg  daily. I considered ACEI/ARB (only prior issue was lisinopril 20mg  daily lowering BP too much), but she has had intermittently elevated K in the past which could present challenges. Will also check TSH today. Discussed reducing salt intake in diet as well. 2. Chest discomfort - recently negative troponin, EKG generally unchanged. Pain free in office. Will plan Lexiscan nuclear stress test to evaluate. No prior dx of coronary disease. + Fam hx in mother in her 81s. She is a nonsmoker. She has h/o statin intolerance. 3. Mild AI, MR, TR - clinically stable. 4. Ascending aortic dilation - hopefully we can achieve good blood pressure control. It may be challenging given her h/o sensitivity to medications. Further surveillance per primary cardiologist. She has equal pulses in extremities and recent CXR showed normal mediastinal contours.  Disposition: F/u with APP in 4 weeks for recheck. She will also continue to follow BP at home.   Medication Adjustments/Labs and Tests Ordered: Current medicines are reviewed at length with the patient today.  Concerns regarding medicines are outlined above. Medication changes, Labs and Tests ordered today are summarized above and listed in the Patient Instructions accessible in Encounters.   Signed, Charlie Pitter, PA-C  12/21/2017 11:48 AM    [facility] in [place], [place] [address] Ph: [phone]; Fax [phone]

## 2017-12-21 ENCOUNTER — Other Ambulatory Visit (HOSPITAL_COMMUNITY)
Admission: RE | Admit: 2017-12-21 | Discharge: 2017-12-21 | Disposition: A | Discharge status: Home / Self Care | Payer: PPO | Source: Ambulatory Visit | Attending: Physician Assistant | Admitting: Physician Assistant

## 2017-12-21 ENCOUNTER — Ambulatory Visit (INDEPENDENT_AMBULATORY_CARE_PROVIDER_SITE_OTHER): Payer: PPO | Admitting: Physician Assistant

## 2017-12-21 ENCOUNTER — Encounter: Payer: Self-pay | Admitting: Physician Assistant

## 2017-12-21 ENCOUNTER — Encounter: Payer: Self-pay | Admitting: *Deleted

## 2017-12-21 VITALS — BP 160/86 | HR 58 | Ht 67.0 in | Wt 134.0 lb

## 2017-12-21 DIAGNOSIS — I071 Rheumatic tricuspid insufficiency: Secondary | ICD-10-CM

## 2017-12-21 DIAGNOSIS — R0789 Other chest pain: Secondary | ICD-10-CM | POA: Insufficient documentation

## 2017-12-21 DIAGNOSIS — R0989 Other specified symptoms and signs involving the circulatory and respiratory systems: Secondary | ICD-10-CM | POA: Diagnosis not present

## 2017-12-21 DIAGNOSIS — I340 Nonrheumatic mitral (valve) insufficiency: Secondary | ICD-10-CM

## 2017-12-21 DIAGNOSIS — I351 Nonrheumatic aortic (valve) insufficiency: Secondary | ICD-10-CM | POA: Diagnosis not present

## 2017-12-21 DIAGNOSIS — R079 Chest pain, unspecified: Secondary | ICD-10-CM | POA: Diagnosis not present

## 2017-12-21 DIAGNOSIS — I77819 Aortic ectasia, unspecified site: Secondary | ICD-10-CM

## 2017-12-21 LAB — TSH: TSH: 4.641 u[IU]/mL — AB (ref 0.350–4.500)

## 2017-12-21 MED ORDER — FELODIPINE ER 5 MG PO TB24: 5.0000 mg | ORAL_TABLET | Freq: Every day | ORAL | 1 refills | Status: DC

## 2017-12-21 MED ORDER — NEBIVOLOL HCL 5 MG PO TABS: 5.0000 mg | ORAL_TABLET | Freq: Every day | ORAL | 1 refills | Status: DC

## 2017-12-21 NOTE — Patient Instructions (Signed)
Medication Instructions:  Your physician has recommended you make the following change in your medication:  Stop Taking Labetalol Start Felodipine 5 mg Daily  Restart Bystolic 5 mg Daily    Labwork: Your physician recommends that you return for lab work today.   Testing/Procedures: NONE   Follow-Up: Your physician recommends that you schedule a follow-up appointment in: 4 Weeks   Any Other Special Instructions Will Be Listed Below (If Applicable).     If you need a refill on your cardiac medications before your next appointment, please call your pharmacy. Thank you for choosing [facility]!

## 2017-12-22 ENCOUNTER — Telehealth: Payer: Self-pay | Admitting: *Deleted

## 2017-12-22 NOTE — Telephone Encounter (Signed)
Called to give test results. Pt states that she took Bystolic and Plendil at 9am today and reports that her legs are now swollen. Pt will continue to monitor.

## 2017-12-22 NOTE — Telephone Encounter (Signed)
I would give it a little more time and reassurance. Pt reported yesterday at visit legs and hands were swollen but I did not appreciate any clinically apparent or significant edema. She is exquisitely sensitive to medication side effects. I would recommend to elevate legs above heart level and reduce sodium to maximum 2000mg  per day. If swelling persists, would stop felodipine and start lisinopril 5mg  daily with BMET 1 week after. Recommend she get back with her PCP with 7 days for recheck. Shaunak Kreis PA-C

## 2017-12-23 ENCOUNTER — Telehealth: Payer: Self-pay | Admitting: Cardiology

## 2017-12-23 NOTE — Telephone Encounter (Signed)
See prior phone note from yesterday. OK to follow just on the bystolic if BP is now running normal, stop felodipine. If BP begins to trend upwards -> see other phone note. Thanks, Melina Copa PA-C

## 2017-12-23 NOTE — Telephone Encounter (Signed)
Patient will stop plendil and call us back if BP starts to rise

## 2017-12-23 NOTE — Telephone Encounter (Signed)
Pt called stating that the felodipine (PLENDIL) 5 MG 24 hr tablet [[id number]]  Is really bothering her legs. They started swelling, her legs were restless and kept her from sleeping

## 2017-12-23 NOTE — Telephone Encounter (Signed)
Patient has taken one Plendil tablet since visit 2 days ago, and now complains that her legs were bothering her.She took bystolic today. BP was 171/91 prior to taking bystolic, now has BP 159/45 and is afraid she will "bottom out"

## 2017-12-26 ENCOUNTER — Telehealth: Payer: Self-pay | Admitting: Cardiology

## 2017-12-26 NOTE — Telephone Encounter (Signed)
Attempt to reach, got vm, [id number]

## 2017-12-26 NOTE — Telephone Encounter (Signed)
Home BP's: 128/78  HR 57, 144/78 60, 118/61 62, 125/77 63, 136/74 60, 133/77 61, 148/80 55 (before meds), 119/67  47    Pt reports she has gout which she takes meds for and they have been flairing up lately

## 2017-12-26 NOTE — Telephone Encounter (Signed)
Please call pt concerning BP readings from the weekend

## 2017-12-26 NOTE — Telephone Encounter (Signed)
   Dayna evaluated this patient in clinic last week and she was restarted on Bystolic and Felopidine was added to her medication regimen. She subsequently stopped Felopidine due to worsening edema. Reviewing the readings from today, these are overall well-controlled as compared to peviously. Would continue on her current regimen. Please inform her to only check BP a maximum of twice daily unless having symptoms. Could also provide her with a BP log which she could follow for several weeks which would provide more long-term information in regards to how she is responding to the medications.   Signed, Erma Heritage, PA-C 12/26/2017, 10:12 AM Pager: [phone]

## 2017-12-29 ENCOUNTER — Ambulatory Visit (HOSPITAL_COMMUNITY)
Admission: RE | Admit: 2017-12-29 | Discharge: 2017-12-29 | Disposition: A | Discharge status: Home / Self Care | Payer: PPO | Source: Ambulatory Visit | Attending: Internal Medicine | Admitting: Internal Medicine

## 2017-12-29 ENCOUNTER — Encounter (HOSPITAL_BASED_OUTPATIENT_CLINIC_OR_DEPARTMENT_OTHER)
Admission: RE | Admit: 2017-12-29 | Discharge: 2017-12-29 | Disposition: A | Discharge status: Home / Self Care | Payer: PPO | Source: Ambulatory Visit | Attending: Physician Assistant | Admitting: Physician Assistant

## 2017-12-29 DIAGNOSIS — R079 Chest pain, unspecified: Secondary | ICD-10-CM | POA: Diagnosis not present

## 2017-12-29 DIAGNOSIS — R0789 Other chest pain: Secondary | ICD-10-CM | POA: Insufficient documentation

## 2017-12-29 LAB — NM MYOCAR MULTI W/SPECT W/WALL MOTION / EF
LV dias vol: 36 mL (ref 46–106)
LV sys vol: 7 mL
Peak HR: 93 {beats}/min
RATE: 0.35
Rest HR: 51 {beats}/min
SDS: 1
SRS: 1
SSS: 2
TID: 0.75

## 2017-12-29 MED ORDER — REGADENOSON 0.4 MG/5ML IV SOLN
INTRAVENOUS | Status: AC
  Administered 2017-12-29: 0.4 mg via INTRAVENOUS
  Filled 2017-12-29: qty 5

## 2017-12-29 MED ORDER — TECHNETIUM TC 99M TETROFOSMIN IV KIT
30.0000 | PACK | Freq: Once | INTRAVENOUS | Status: AC | PRN
  Administered 2017-12-29: 30 via INTRAVENOUS

## 2017-12-29 MED ORDER — TECHNETIUM TC 99M TETROFOSMIN IV KIT
10.0000 | PACK | Freq: Once | INTRAVENOUS | Status: AC | PRN
  Administered 2017-12-29: 10.2 via INTRAVENOUS

## 2017-12-29 MED ORDER — SODIUM CHLORIDE 0.9% FLUSH
INTRAVENOUS | Status: AC
  Administered 2017-12-29: 10 mL via INTRAVENOUS
  Filled 2017-12-29: qty 10

## 2018-01-16 DIAGNOSIS — I10 Essential (primary) hypertension: Secondary | ICD-10-CM | POA: Diagnosis not present

## 2018-01-16 DIAGNOSIS — H6092 Unspecified otitis externa, left ear: Secondary | ICD-10-CM | POA: Diagnosis not present

## 2018-01-16 DIAGNOSIS — Z6823 Body mass index (BMI) 23.0-23.9, adult: Secondary | ICD-10-CM | POA: Diagnosis not present

## 2018-01-30 ENCOUNTER — Ambulatory Visit: Payer: PPO | Admitting: Cardiology

## 2018-01-30 ENCOUNTER — Encounter: Payer: Self-pay | Admitting: Cardiology

## 2018-01-30 VITALS — BP 126/86 | HR 57 | Ht 66.0 in | Wt 136.0 lb

## 2018-01-30 DIAGNOSIS — I10 Essential (primary) hypertension: Secondary | ICD-10-CM | POA: Diagnosis not present

## 2018-01-30 DIAGNOSIS — I77819 Aortic ectasia, unspecified site: Secondary | ICD-10-CM

## 2018-01-30 DIAGNOSIS — Z789 Other specified health status: Secondary | ICD-10-CM | POA: Diagnosis not present

## 2018-01-30 MED ORDER — NEBIVOLOL HCL 10 MG PO TABS: 5.0000 mg | ORAL_TABLET | Freq: Every day | ORAL | 6 refills | Status: DC

## 2018-01-30 NOTE — Patient Instructions (Addendum)
Your physician wants you to follow-up in:  6 months with Dr.McDowell You will receive a reminder letter in the mail two months in advance. If you don't receive a letter, please call our office to schedule the follow-up appointment.     Your physician recommends that you continue on your current medications as directed. Please refer to the Current Medication list given to you today.Take 10 mg Bystolic and cut in half, take 1/2 tablet = 5 mg      If you need a refill on your cardiac medications before your next appointment, please call your pharmacy.      No labs or tests today    Thank you for choosing [facility] !

## 2018-01-30 NOTE — Progress Notes (Signed)
Cardiology Office Note  Date: 01/30/2018   ID: Allison Morris, DOB July 31, 1935, MRN [id number]  PCP: Redmond School, MD  Primary Cardiologist: Rozann Lesches, MD   Chief Complaint  Patient presents with  . Follow-up hypertension    History of Present Illness: Allison Morris is an 82 y.o. female last seen in July by Ms. Dunn PA-C.  She presents to go over her blood pressure control.  I reviewed her home log.  She states that she feels well, no chest pain or palpitations.  At the last visit medications for hypertension control were changed from labetalol back to Bystolic and also the addition of felodipine given previous intolerance of amlodipine.  She states that she could not tolerate felodipine due to leg pain.  Interestingly, when she takes Bystolic 10 mg 1/2 tablet daily her blood pressure is better controlled than when she was taking a specific 5 mg tablet.  Lexiscan Myoview in July was low risk showing evidence of breast attenuation and LVEF 81%.  Past Medical History:  Diagnosis Date  . Aortic dilatation (HCC)    a. echo 08/2017 showed EF 65-70%, grade 1 DD, mild AI, mildly dilated ascending aorta (18mm), mild MR, mild TR.   Marland Kitchen Aortic regurgitation   . CKD (chronic kidney disease), stage III ([place])   . Essential hypertension   . History of DVT (deep vein thrombosis)    Left leg 1997   . Hyperlipemia   . Mild aortic insufficiency   . Mild mitral regurgitation   . Mild tricuspid insufficiency     Past Surgical History:  Procedure Laterality Date  . APPENDECTOMY    . CARPAL TUNNEL RELEASE    . TONSILLECTOMY      Current Outpatient Medications  Medication Sig Dispense Refill  . acetaminophen (TYLENOL) 500 MG tablet Take 500 mg by mouth every 6 (six) hours as needed for mild pain or moderate pain.     Marland Kitchen allopurinol (ZYLOPRIM) 100 MG tablet Take 50 mg by mouth daily.    Marland Kitchen aspirin EC 81 MG EC tablet Take 1 tablet (81 mg total) by mouth daily. 100 tablet 1  .  Cholecalciferol (VITAMIN D3) 1000 units CAPS Take 1 capsule by mouth daily.     . clobetasol cream (TEMOVATE) 4.40 % Apply 1 application topically daily as needed (for irritation).     . clopidogrel (PLAVIX) 75 MG tablet Take 1 tablet (75 mg total) by mouth daily. 30 tablet 2  . colchicine 0.6 MG tablet Take 0.6 mg by mouth. 1/2 tablet daily    . fexofenadine (ALLEGRA) 180 MG tablet Take 180 mg by mouth daily.    . fluticasone (FLONASE) 50 MCG/ACT nasal spray Place 2 sprays into both nostrils daily.     . Glucosamine-Chondroit-Vit C-Mn (GLUCOSAMINE 1500 COMPLEX PO) Take 1 tablet by mouth daily.     . Omega-3 Fatty Acids (FISH OIL) 1200 MG CAPS Take 1 capsule by mouth 2 (two) times daily.     . pantoprazole (PROTONIX) 40 MG tablet Take 40 mg by mouth daily.     . nebivolol (BYSTOLIC) 10 MG tablet Take 0.5 tablets (5 mg total) by mouth daily. 45 tablet 6   No current facility-administered medications for this visit.    Allergies:  Amlodipine; Biaxin [clarithromycin]; Doxycycline; Estrogens; Felodipine; Labetalol; Macrodantin [nitrofurantoin]; Statins; Ceftin [cefuroxime]; Ciprofloxacin; and Sulfa antibiotics   Social History: The patient  reports that she has never smoked. She has never used smokeless tobacco. She reports that she  does not drink alcohol or use drugs.   ROS:  Please see the history of present illness. Otherwise, complete review of systems is positive for none.  All other systems are reviewed and negative.   Physical Exam: VS:  BP 126/86   Pulse (!) 57   Ht 5\' 6"  (1.676 m)   Wt 136 lb (61.7 kg)   SpO2 96%   BMI 21.95 kg/m , BMI Body mass index is 21.95 kg/m.  Wt Readings from Last 3 Encounters:  01/30/18 136 lb (61.7 kg)  12/21/17 134 lb (60.8 kg)  12/16/17 133 lb (60.3 kg)    General: Elderly woman, appears comfortable at rest. HEENT: Conjunctiva and lids normal, oropharynx clear. Neck: Supple, no elevated JVP or carotid bruits, no thyromegaly. Lungs: Clear to  auscultation, nonlabored breathing at rest. Cardiac: Regular rate and rhythm, no S3, soft systolic murmur. Abdomen: Soft, nontender, bowel sounds present. Extremities: No pitting edema, distal pulses 2+.  ECG: I personally reviewed the tracing from 12/21/2017 which showed sinus arrhythmia.  Recent Labwork: 12/12/2017: ALT 15; AST 17 12/16/2017: BUN 21; Creatinine, Ser 1.27; Hemoglobin 13.4; Platelets 168; Potassium 4.9; Sodium 136 12/21/2017: TSH 4.641     Component Value Date/Time   CHOL 248 (H) 11/15/2015 0542   TRIG 87 11/15/2015 0542   HDL 48 11/15/2015 0542   CHOLHDL 5.2 11/15/2015 0542   VLDL 17 11/15/2015 0542   LDLCALC 183 (H) 11/15/2015 0542    Other Studies Reviewed Today:  Lexiscan Myoview 12/29/2017:  No diagnostic ST segment changes to indicate ischemia.  Small, mild intensity, apical anterior defect that is fixed and consistent with breast attenuation. No significant ischemic defects noted.  This is a low risk study.  Nuclear stress EF: 81%.  Assessment and Plan:  1.  Essential hypertension.  Blood pressure control is reasonable today on Bystolic 10 mg 1/2 tablet daily.  No changes were made.  Of note, she did not tolerate felodipine due to leg pain.  2.  Mildly dilated aortic root by echocardiogram, she is asymptomatic.  We will continue surveillance over time.  3.  Mixed hyperlipidemia with statin intolerance.  [facility].  Current medicines were reviewed with the patient today.  Disposition: Follow-up in 6 months.  Signed, Satira Sark, MD, [facility] 01/30/2018 1:38 PM    [facility] [address] [place], [place] [address] Phone: [phone]; Fax: [phone]

## 2018-02-24 DIAGNOSIS — Z6824 Body mass index (BMI) 24.0-24.9, adult: Secondary | ICD-10-CM | POA: Diagnosis not present

## 2018-02-24 DIAGNOSIS — M109 Gout, unspecified: Secondary | ICD-10-CM | POA: Diagnosis not present

## 2018-02-24 DIAGNOSIS — Z23 Encounter for immunization: Secondary | ICD-10-CM | POA: Diagnosis not present

## 2018-03-10 DIAGNOSIS — M109 Gout, unspecified: Secondary | ICD-10-CM | POA: Diagnosis not present

## 2018-04-14 ENCOUNTER — Other Ambulatory Visit (HOSPITAL_COMMUNITY): Payer: Self-pay | Admitting: Internal Medicine

## 2018-04-14 ENCOUNTER — Ambulatory Visit (HOSPITAL_COMMUNITY)
Admission: RE | Admit: 2018-04-14 | Discharge: 2018-04-14 | Disposition: A | Discharge status: Home / Self Care | Payer: PPO | Source: Ambulatory Visit | Attending: Internal Medicine | Admitting: Internal Medicine

## 2018-04-14 DIAGNOSIS — M1991 Primary osteoarthritis, unspecified site: Secondary | ICD-10-CM | POA: Diagnosis not present

## 2018-04-14 DIAGNOSIS — R059 Cough, unspecified: Secondary | ICD-10-CM

## 2018-04-14 DIAGNOSIS — J189 Pneumonia, unspecified organism: Secondary | ICD-10-CM | POA: Diagnosis not present

## 2018-04-14 DIAGNOSIS — Z1389 Encounter for screening for other disorder: Secondary | ICD-10-CM | POA: Diagnosis not present

## 2018-04-14 DIAGNOSIS — R05 Cough: Secondary | ICD-10-CM

## 2018-04-14 DIAGNOSIS — E063 Autoimmune thyroiditis: Secondary | ICD-10-CM | POA: Diagnosis not present

## 2018-04-14 DIAGNOSIS — J329 Chronic sinusitis, unspecified: Secondary | ICD-10-CM | POA: Diagnosis not present

## 2018-04-14 DIAGNOSIS — M810 Age-related osteoporosis without current pathological fracture: Secondary | ICD-10-CM | POA: Diagnosis not present

## 2018-04-18 DIAGNOSIS — C4441 Basal cell carcinoma of skin of scalp and neck: Secondary | ICD-10-CM | POA: Diagnosis not present

## 2018-04-18 DIAGNOSIS — Z08 Encounter for follow-up examination after completed treatment for malignant neoplasm: Secondary | ICD-10-CM | POA: Diagnosis not present

## 2018-04-18 DIAGNOSIS — Z85828 Personal history of other malignant neoplasm of skin: Secondary | ICD-10-CM | POA: Diagnosis not present

## 2018-04-18 DIAGNOSIS — L218 Other seborrheic dermatitis: Secondary | ICD-10-CM | POA: Diagnosis not present

## 2018-04-18 DIAGNOSIS — L308 Other specified dermatitis: Secondary | ICD-10-CM | POA: Diagnosis not present

## 2018-04-24 DIAGNOSIS — R110 Nausea: Secondary | ICD-10-CM | POA: Diagnosis not present

## 2018-04-24 DIAGNOSIS — B370 Candidal stomatitis: Secondary | ICD-10-CM | POA: Diagnosis not present

## 2018-04-24 DIAGNOSIS — Z6823 Body mass index (BMI) 23.0-23.9, adult: Secondary | ICD-10-CM | POA: Diagnosis not present

## 2018-04-24 DIAGNOSIS — R42 Dizziness and giddiness: Secondary | ICD-10-CM | POA: Diagnosis not present

## 2018-05-08 DIAGNOSIS — K219 Gastro-esophageal reflux disease without esophagitis: Secondary | ICD-10-CM | POA: Diagnosis not present

## 2018-05-08 DIAGNOSIS — E063 Autoimmune thyroiditis: Secondary | ICD-10-CM | POA: Diagnosis not present

## 2018-05-08 DIAGNOSIS — Z6823 Body mass index (BMI) 23.0-23.9, adult: Secondary | ICD-10-CM | POA: Diagnosis not present

## 2018-05-08 DIAGNOSIS — J329 Chronic sinusitis, unspecified: Secondary | ICD-10-CM | POA: Diagnosis not present

## 2018-05-08 DIAGNOSIS — E049 Nontoxic goiter, unspecified: Secondary | ICD-10-CM | POA: Diagnosis not present

## 2018-05-10 DIAGNOSIS — H1851 Endothelial corneal dystrophy: Secondary | ICD-10-CM | POA: Diagnosis not present

## 2018-05-10 DIAGNOSIS — H10413 Chronic giant papillary conjunctivitis, bilateral: Secondary | ICD-10-CM | POA: Diagnosis not present

## 2018-05-10 DIAGNOSIS — H04123 Dry eye syndrome of bilateral lacrimal glands: Secondary | ICD-10-CM | POA: Diagnosis not present

## 2018-05-10 DIAGNOSIS — Z961 Presence of intraocular lens: Secondary | ICD-10-CM | POA: Diagnosis not present

## 2018-05-18 DIAGNOSIS — Z6823 Body mass index (BMI) 23.0-23.9, adult: Secondary | ICD-10-CM | POA: Diagnosis not present

## 2018-05-18 DIAGNOSIS — J029 Acute pharyngitis, unspecified: Secondary | ICD-10-CM | POA: Diagnosis not present

## 2018-05-22 DIAGNOSIS — B370 Candidal stomatitis: Secondary | ICD-10-CM | POA: Diagnosis not present

## 2018-05-22 DIAGNOSIS — Z6823 Body mass index (BMI) 23.0-23.9, adult: Secondary | ICD-10-CM | POA: Diagnosis not present

## 2018-05-22 DIAGNOSIS — M810 Age-related osteoporosis without current pathological fracture: Secondary | ICD-10-CM | POA: Diagnosis not present

## 2018-05-22 DIAGNOSIS — K219 Gastro-esophageal reflux disease without esophagitis: Secondary | ICD-10-CM | POA: Diagnosis not present

## 2018-05-22 DIAGNOSIS — J209 Acute bronchitis, unspecified: Secondary | ICD-10-CM | POA: Diagnosis not present

## 2018-05-22 DIAGNOSIS — M1991 Primary osteoarthritis, unspecified site: Secondary | ICD-10-CM | POA: Diagnosis not present

## 2018-05-22 DIAGNOSIS — K121 Other forms of stomatitis: Secondary | ICD-10-CM | POA: Diagnosis not present

## 2018-05-22 DIAGNOSIS — J329 Chronic sinusitis, unspecified: Secondary | ICD-10-CM | POA: Diagnosis not present

## 2018-06-12 ENCOUNTER — Other Ambulatory Visit (HOSPITAL_COMMUNITY): Payer: Self-pay | Admitting: Internal Medicine

## 2018-06-12 DIAGNOSIS — Z1231 Encounter for screening mammogram for malignant neoplasm of breast: Secondary | ICD-10-CM

## 2018-06-13 DIAGNOSIS — Z08 Encounter for follow-up examination after completed treatment for malignant neoplasm: Secondary | ICD-10-CM | POA: Diagnosis not present

## 2018-06-13 DIAGNOSIS — L570 Actinic keratosis: Secondary | ICD-10-CM | POA: Diagnosis not present

## 2018-06-13 DIAGNOSIS — X32XXXD Exposure to sunlight, subsequent encounter: Secondary | ICD-10-CM | POA: Diagnosis not present

## 2018-06-13 DIAGNOSIS — Z85828 Personal history of other malignant neoplasm of skin: Secondary | ICD-10-CM | POA: Diagnosis not present

## 2018-06-22 DIAGNOSIS — E7849 Other hyperlipidemia: Secondary | ICD-10-CM | POA: Diagnosis not present

## 2018-06-22 DIAGNOSIS — M1991 Primary osteoarthritis, unspecified site: Secondary | ICD-10-CM | POA: Diagnosis not present

## 2018-06-22 DIAGNOSIS — K219 Gastro-esophageal reflux disease without esophagitis: Secondary | ICD-10-CM | POA: Diagnosis not present

## 2018-06-22 DIAGNOSIS — I82409 Acute embolism and thrombosis of unspecified deep veins of unspecified lower extremity: Secondary | ICD-10-CM | POA: Diagnosis not present

## 2018-06-22 DIAGNOSIS — M810 Age-related osteoporosis without current pathological fracture: Secondary | ICD-10-CM | POA: Diagnosis not present

## 2018-06-22 DIAGNOSIS — N183 Chronic kidney disease, stage 3 (moderate): Secondary | ICD-10-CM | POA: Diagnosis not present

## 2018-06-22 DIAGNOSIS — D649 Anemia, unspecified: Secondary | ICD-10-CM | POA: Diagnosis not present

## 2018-06-22 DIAGNOSIS — M109 Gout, unspecified: Secondary | ICD-10-CM | POA: Diagnosis not present

## 2018-06-22 DIAGNOSIS — Z1389 Encounter for screening for other disorder: Secondary | ICD-10-CM | POA: Diagnosis not present

## 2018-06-22 DIAGNOSIS — Z681 Body mass index (BMI) 19 or less, adult: Secondary | ICD-10-CM | POA: Diagnosis not present

## 2018-06-22 DIAGNOSIS — I6523 Occlusion and stenosis of bilateral carotid arteries: Secondary | ICD-10-CM | POA: Diagnosis not present

## 2018-06-22 DIAGNOSIS — I10 Essential (primary) hypertension: Secondary | ICD-10-CM | POA: Diagnosis not present

## 2018-06-22 DIAGNOSIS — Z0001 Encounter for general adult medical examination with abnormal findings: Secondary | ICD-10-CM | POA: Diagnosis not present

## 2018-07-04 DIAGNOSIS — E042 Nontoxic multinodular goiter: Secondary | ICD-10-CM | POA: Diagnosis not present

## 2018-07-04 DIAGNOSIS — I10 Essential (primary) hypertension: Secondary | ICD-10-CM | POA: Diagnosis not present

## 2018-07-04 DIAGNOSIS — Z6822 Body mass index (BMI) 22.0-22.9, adult: Secondary | ICD-10-CM | POA: Diagnosis not present

## 2018-07-07 DIAGNOSIS — Z6823 Body mass index (BMI) 23.0-23.9, adult: Secondary | ICD-10-CM | POA: Diagnosis not present

## 2018-07-07 DIAGNOSIS — K219 Gastro-esophageal reflux disease without esophagitis: Secondary | ICD-10-CM | POA: Diagnosis not present

## 2018-07-07 DIAGNOSIS — I10 Essential (primary) hypertension: Secondary | ICD-10-CM | POA: Diagnosis not present

## 2018-07-07 DIAGNOSIS — G909 Disorder of the autonomic nervous system, unspecified: Secondary | ICD-10-CM | POA: Diagnosis not present

## 2018-07-07 DIAGNOSIS — R51 Headache: Secondary | ICD-10-CM | POA: Diagnosis not present

## 2018-07-12 ENCOUNTER — Ambulatory Visit (HOSPITAL_COMMUNITY)
Admission: RE | Admit: 2018-07-12 | Discharge: 2018-07-12 | Disposition: A | Discharge status: Home / Self Care | Payer: PPO | Source: Ambulatory Visit | Attending: Internal Medicine | Admitting: Internal Medicine

## 2018-07-12 ENCOUNTER — Ambulatory Visit (HOSPITAL_COMMUNITY): Payer: PPO

## 2018-07-12 ENCOUNTER — Encounter (HOSPITAL_COMMUNITY): Payer: Self-pay

## 2018-07-12 DIAGNOSIS — Z1231 Encounter for screening mammogram for malignant neoplasm of breast: Secondary | ICD-10-CM | POA: Diagnosis not present

## 2018-07-18 DIAGNOSIS — I10 Essential (primary) hypertension: Secondary | ICD-10-CM | POA: Diagnosis not present

## 2018-07-18 DIAGNOSIS — Z6823 Body mass index (BMI) 23.0-23.9, adult: Secondary | ICD-10-CM | POA: Diagnosis not present

## 2018-07-18 DIAGNOSIS — S9000XA Contusion of unspecified ankle, initial encounter: Secondary | ICD-10-CM | POA: Diagnosis not present

## 2018-08-15 DIAGNOSIS — N183 Chronic kidney disease, stage 3 (moderate): Secondary | ICD-10-CM | POA: Diagnosis not present

## 2018-08-22 DIAGNOSIS — N2581 Secondary hyperparathyroidism of renal origin: Secondary | ICD-10-CM | POA: Diagnosis not present

## 2018-08-22 DIAGNOSIS — M109 Gout, unspecified: Secondary | ICD-10-CM | POA: Diagnosis not present

## 2018-08-22 DIAGNOSIS — D631 Anemia in chronic kidney disease: Secondary | ICD-10-CM | POA: Diagnosis not present

## 2018-08-22 DIAGNOSIS — I129 Hypertensive chronic kidney disease with stage 1 through stage 4 chronic kidney disease, or unspecified chronic kidney disease: Secondary | ICD-10-CM | POA: Diagnosis not present

## 2018-08-22 DIAGNOSIS — N183 Chronic kidney disease, stage 3 (moderate): Secondary | ICD-10-CM | POA: Diagnosis not present

## 2018-08-29 ENCOUNTER — Telehealth: Payer: Self-pay | Admitting: Cardiology

## 2018-08-29 NOTE — Telephone Encounter (Signed)
°  Precert needed for:  Carotid   Location: [facility]    Date: September 28, 2018

## 2018-08-31 ENCOUNTER — Ambulatory Visit: Payer: PPO | Admitting: Cardiology

## 2018-08-31 ENCOUNTER — Other Ambulatory Visit: Payer: PPO

## 2018-09-01 ENCOUNTER — Ambulatory Visit: Payer: PPO | Admitting: Cardiology

## 2018-09-12 DIAGNOSIS — R001 Bradycardia, unspecified: Secondary | ICD-10-CM | POA: Diagnosis not present

## 2018-09-12 DIAGNOSIS — M1711 Unilateral primary osteoarthritis, right knee: Secondary | ICD-10-CM | POA: Diagnosis not present

## 2018-09-12 DIAGNOSIS — J90 Pleural effusion, not elsewhere classified: Secondary | ICD-10-CM | POA: Diagnosis not present

## 2018-09-12 DIAGNOSIS — H3530 Unspecified macular degeneration: Secondary | ICD-10-CM | POA: Diagnosis not present

## 2018-09-12 DIAGNOSIS — I959 Hypotension, unspecified: Secondary | ICD-10-CM | POA: Diagnosis not present

## 2018-09-12 DIAGNOSIS — Z8701 Personal history of pneumonia (recurrent): Secondary | ICD-10-CM | POA: Diagnosis not present

## 2018-09-12 DIAGNOSIS — I5032 Chronic diastolic (congestive) heart failure: Secondary | ICD-10-CM | POA: Diagnosis not present

## 2018-09-12 DIAGNOSIS — N400 Enlarged prostate without lower urinary tract symptoms: Secondary | ICD-10-CM | POA: Diagnosis not present

## 2018-09-12 DIAGNOSIS — I110 Hypertensive heart disease with heart failure: Secondary | ICD-10-CM | POA: Diagnosis not present

## 2018-09-12 DIAGNOSIS — K219 Gastro-esophageal reflux disease without esophagitis: Secondary | ICD-10-CM | POA: Diagnosis not present

## 2018-09-12 DIAGNOSIS — Z955 Presence of coronary angioplasty implant and graft: Secondary | ICD-10-CM | POA: Diagnosis not present

## 2018-09-12 DIAGNOSIS — Q181 Preauricular sinus and cyst: Secondary | ICD-10-CM | POA: Diagnosis not present

## 2018-09-12 DIAGNOSIS — B999 Unspecified infectious disease: Secondary | ICD-10-CM | POA: Diagnosis not present

## 2018-09-12 DIAGNOSIS — Z6823 Body mass index (BMI) 23.0-23.9, adult: Secondary | ICD-10-CM | POA: Diagnosis not present

## 2018-09-12 DIAGNOSIS — C8338 Diffuse large B-cell lymphoma, lymph nodes of multiple sites: Secondary | ICD-10-CM | POA: Diagnosis not present

## 2018-09-12 DIAGNOSIS — R638 Other symptoms and signs concerning food and fluid intake: Secondary | ICD-10-CM | POA: Diagnosis not present

## 2018-09-12 DIAGNOSIS — M8580 Other specified disorders of bone density and structure, unspecified site: Secondary | ICD-10-CM | POA: Diagnosis not present

## 2018-09-12 DIAGNOSIS — Z79899 Other long term (current) drug therapy: Secondary | ICD-10-CM | POA: Diagnosis not present

## 2018-09-12 DIAGNOSIS — I4891 Unspecified atrial fibrillation: Secondary | ICD-10-CM | POA: Diagnosis not present

## 2018-09-12 DIAGNOSIS — J449 Chronic obstructive pulmonary disease, unspecified: Secondary | ICD-10-CM | POA: Diagnosis not present

## 2018-09-12 DIAGNOSIS — I350 Nonrheumatic aortic (valve) stenosis: Secondary | ICD-10-CM | POA: Diagnosis not present

## 2018-09-12 DIAGNOSIS — Z9484 Stem cells transplant status: Secondary | ICD-10-CM | POA: Diagnosis not present

## 2018-09-12 DIAGNOSIS — E785 Hyperlipidemia, unspecified: Secondary | ICD-10-CM | POA: Diagnosis not present

## 2018-09-12 DIAGNOSIS — M109 Gout, unspecified: Secondary | ICD-10-CM | POA: Diagnosis not present

## 2018-09-12 DIAGNOSIS — G2581 Restless legs syndrome: Secondary | ICD-10-CM | POA: Diagnosis not present

## 2018-09-12 DIAGNOSIS — F0390 Unspecified dementia without behavioral disturbance: Secondary | ICD-10-CM | POA: Diagnosis not present

## 2018-09-12 DIAGNOSIS — M1611 Unilateral primary osteoarthritis, right hip: Secondary | ICD-10-CM | POA: Diagnosis not present

## 2018-09-12 DIAGNOSIS — Z9181 History of falling: Secondary | ICD-10-CM | POA: Diagnosis not present

## 2018-09-12 DIAGNOSIS — Z8673 Personal history of transient ischemic attack (TIA), and cerebral infarction without residual deficits: Secondary | ICD-10-CM | POA: Diagnosis not present

## 2018-09-12 DIAGNOSIS — Z79891 Long term (current) use of opiate analgesic: Secondary | ICD-10-CM | POA: Diagnosis not present

## 2018-09-12 DIAGNOSIS — R310 Gross hematuria: Secondary | ICD-10-CM | POA: Diagnosis not present

## 2018-09-12 DIAGNOSIS — I2510 Atherosclerotic heart disease of native coronary artery without angina pectoris: Secondary | ICD-10-CM | POA: Diagnosis not present

## 2018-09-12 DIAGNOSIS — H9190 Unspecified hearing loss, unspecified ear: Secondary | ICD-10-CM | POA: Diagnosis not present

## 2018-09-12 DIAGNOSIS — G894 Chronic pain syndrome: Secondary | ICD-10-CM | POA: Diagnosis not present

## 2018-09-27 DIAGNOSIS — Z1389 Encounter for screening for other disorder: Secondary | ICD-10-CM | POA: Diagnosis not present

## 2018-09-27 DIAGNOSIS — R350 Frequency of micturition: Secondary | ICD-10-CM | POA: Diagnosis not present

## 2018-09-27 DIAGNOSIS — I10 Essential (primary) hypertension: Secondary | ICD-10-CM | POA: Diagnosis not present

## 2018-09-27 DIAGNOSIS — Z6822 Body mass index (BMI) 22.0-22.9, adult: Secondary | ICD-10-CM | POA: Diagnosis not present

## 2018-09-28 ENCOUNTER — Other Ambulatory Visit: Payer: PPO

## 2018-10-13 DIAGNOSIS — Z6822 Body mass index (BMI) 22.0-22.9, adult: Secondary | ICD-10-CM | POA: Diagnosis not present

## 2018-10-13 DIAGNOSIS — N183 Chronic kidney disease, stage 3 (moderate): Secondary | ICD-10-CM | POA: Diagnosis not present

## 2018-10-13 DIAGNOSIS — M10079 Idiopathic gout, unspecified ankle and foot: Secondary | ICD-10-CM | POA: Diagnosis not present

## 2018-10-13 DIAGNOSIS — Z1389 Encounter for screening for other disorder: Secondary | ICD-10-CM | POA: Diagnosis not present

## 2018-10-13 DIAGNOSIS — M109 Gout, unspecified: Secondary | ICD-10-CM | POA: Diagnosis not present

## 2018-10-26 ENCOUNTER — Telehealth: Payer: Self-pay | Admitting: Cardiology

## 2018-10-26 NOTE — Telephone Encounter (Signed)
Virtual Visit Pre-Appointment Phone Call  "(Name), I am calling you today to discuss your upcoming appointment. We are currently trying to limit exposure to the virus that causes COVID-19 by seeing patients at home rather than in the office."  1. "What is the BEST phone number to call the day of the visit?" - include this in appointment notes  2. Do you have or have access to (through a family member/friend) a smartphone with video capability that we can use for your visit?" a. If yes - list this number in appt notes as cell (if different from BEST phone #) and list the appointment type as a VIDEO visit in appointment notes b. If no - list the appointment type as a PHONE visit in appointment notes  3. Confirm consent - "In the setting of the current Covid19 crisis, you are scheduled for a (phone or video) visit with your provider on (date) at (time).  Just as we do with many in-office visits, in order for you to participate in this visit, we must obtain consent.  If you'd like, I can send this to your mychart (if signed up) or email for you to review.  Otherwise, I can obtain your verbal consent now.  All virtual visits are billed to your insurance company just like a normal visit would be.  By agreeing to a virtual visit, we'd like you to understand that the technology does not allow for your provider to perform an examination, and thus may limit your provider's ability to fully assess your condition. If your provider identifies any concerns that need to be evaluated in person, we will make arrangements to do so.  Finally, though the technology is pretty good, we cannot assure that it will always work on either your or our end, and in the setting of a video visit, we may have to convert it to a phone-only visit.  In either situation, we cannot ensure that we have a secure connection.  Are you willing to proceed?" STAFF: Did the patient verbally acknowledge consent to telehealth visit? Document  YES/NO here: Yes  4. Advise patient to be prepared - "Two hours prior to your appointment, go ahead and check your blood pressure, pulse, oxygen saturation, and your weight (if you have the equipment to check those) and write them all down. When your visit starts, your provider will ask you for this information. If you have an Apple Watch or Kardia device, please plan to have heart rate information ready on the day of your appointment. Please have a pen and paper handy nearby the day of the visit as well."  5. Give patient instructions for MyChart download to smartphone OR [email or web address] as below if video visit (depending on what platform provider is using)  6. Inform patient they will receive a phone call 15 minutes prior to their appointment time (may be from unknown caller ID) so they should be prepared to answer    TELEPHONE CALL NOTE  Allison Morris has been deemed a candidate for a follow-up tele-health visit to limit community exposure during the Covid-19 pandemic. I spoke with the patient via phone to ensure availability of phone/video source, confirm preferred email & phone number, and discuss instructions and expectations.  I reminded Allison Morris to be prepared with any vital sign and/or heart rhythm information that could potentially be obtained via home monitoring, at the time of her visit. I reminded Allison Morris to expect a phone call prior to  her visit.  [name] 10/26/2018 12:09 PM

## 2018-10-31 ENCOUNTER — Ambulatory Visit: Payer: PPO | Admitting: Cardiology

## 2018-10-31 DIAGNOSIS — I129 Hypertensive chronic kidney disease with stage 1 through stage 4 chronic kidney disease, or unspecified chronic kidney disease: Secondary | ICD-10-CM | POA: Diagnosis not present

## 2018-10-31 DIAGNOSIS — M109 Gout, unspecified: Secondary | ICD-10-CM | POA: Diagnosis not present

## 2018-10-31 DIAGNOSIS — E785 Hyperlipidemia, unspecified: Secondary | ICD-10-CM | POA: Diagnosis not present

## 2018-10-31 DIAGNOSIS — M810 Age-related osteoporosis without current pathological fracture: Secondary | ICD-10-CM | POA: Diagnosis not present

## 2018-10-31 DIAGNOSIS — G459 Transient cerebral ischemic attack, unspecified: Secondary | ICD-10-CM | POA: Diagnosis not present

## 2018-10-31 DIAGNOSIS — E042 Nontoxic multinodular goiter: Secondary | ICD-10-CM | POA: Diagnosis not present

## 2018-10-31 DIAGNOSIS — I77819 Aortic ectasia, unspecified site: Secondary | ICD-10-CM | POA: Diagnosis not present

## 2018-10-31 DIAGNOSIS — I5189 Other ill-defined heart diseases: Secondary | ICD-10-CM | POA: Diagnosis not present

## 2018-10-31 DIAGNOSIS — N183 Chronic kidney disease, stage 3 (moderate): Secondary | ICD-10-CM | POA: Diagnosis not present

## 2018-11-02 DIAGNOSIS — E042 Nontoxic multinodular goiter: Secondary | ICD-10-CM | POA: Diagnosis not present

## 2018-11-02 DIAGNOSIS — E789 Disorder of lipoprotein metabolism, unspecified: Secondary | ICD-10-CM | POA: Diagnosis not present

## 2018-11-02 DIAGNOSIS — I10 Essential (primary) hypertension: Secondary | ICD-10-CM | POA: Diagnosis not present

## 2018-11-02 DIAGNOSIS — M109 Gout, unspecified: Secondary | ICD-10-CM | POA: Diagnosis not present

## 2018-11-02 NOTE — Progress Notes (Signed)
Virtual Visit via Telephone Note   This visit type was conducted due to national recommendations for restrictions regarding the COVID-19 Pandemic (e.g. social distancing) in an effort to limit this patient's exposure and mitigate transmission in our community.  Due to her co-morbid illnesses, this patient is at least at moderate risk for complications without adequate follow up.  This format is felt to be most appropriate for this patient at this time.  The patient did not have access to video technology/had technical difficulties with video requiring transitioning to audio format only (telephone).  All issues noted in this document were discussed and addressed.  No physical exam could be performed with this format.  Please refer to the patient's chart for her  consent to telehealth for [facility].   Date:  11/03/2018   ID:  Allison Morris, DOB 02/07/1936, MRN [id number]  Patient Location: Home Provider Location: Home  PCP:  Redmond School, MD  Cardiologist:  Rozann Lesches, MD Electrophysiologist:  None   Evaluation Performed:  Follow-Up Visit  Chief Complaint:  Cardiac follow-up  History of Present Illness:    Allison Morris is an 83 y.o. female last seen by Ms. Dunn PA-C in July 2019.  Not have video access and we spoke by phone today.  Tells me that she has been concerned about fluctuations in her blood pressure, this is been a long-term issue.  She is taking Bystolic 5 mg tablets once a day, sometimes takes an additional dose in the evening if her blood pressure is elevated.  She states that her diastolics have been more elevated than normal.  She is also concerned that she might be having side effects from taking Synthroid.  She has a prior intolerance to Norvasc and Plendil.  She also apparently had relative hypotension when taking lisinopril previously and intermittently elevated potassium.  Sodium restriction has been reviewed with her.  Also discussed the possibility of a  low-dose diuretic.  She had a follow-up Myoview last year that was low risk.  She is also due for an echocardiogram to reassess LVEF and aortic root dilatation.  The patient does not have symptoms concerning for COVID-19 infection (fever, chills, cough, or new shortness of breath).    Past Medical History:  Diagnosis Date  . Aortic dilatation (HCC)    a. echo 08/2017 showed EF 65-70%, grade 1 DD, mild AI, mildly dilated ascending aorta (40mm), mild MR, mild TR.   Marland Kitchen Aortic regurgitation   . CKD (chronic kidney disease), stage III ([place])   . Essential hypertension   . History of DVT (deep vein thrombosis)    Left leg 1997   . Hyperlipemia   . Mild aortic insufficiency   . Mild mitral regurgitation   . Mild tricuspid insufficiency    Past Surgical History:  Procedure Laterality Date  . APPENDECTOMY    . CARPAL TUNNEL RELEASE    . TONSILLECTOMY       Current Meds  Medication Sig  . acetaminophen (TYLENOL) 500 MG tablet Take 500 mg by mouth every 6 (six) hours as needed for mild pain or moderate pain.   Marland Kitchen allopurinol (ZYLOPRIM) 100 MG tablet Take 50 mg by mouth daily.  Marland Kitchen aspirin EC 81 MG EC tablet Take 1 tablet (81 mg total) by mouth daily.  . Cholecalciferol (VITAMIN D3) 1000 units CAPS Take 1 capsule by mouth daily.   . clobetasol cream (TEMOVATE) 4.00 % Apply 1 application topically daily as needed (for irritation).   Marland Kitchen  clopidogrel (PLAVIX) 75 MG tablet Take 1 tablet (75 mg total) by mouth daily.  . colchicine 0.6 MG tablet Take 0.6 mg by mouth. 1/2 tablet daily  . fexofenadine (ALLEGRA) 180 MG tablet Take 180 mg by mouth daily.  . fluticasone (FLONASE) 50 MCG/ACT nasal spray Place 2 sprays into both nostrils daily.   . Glucosamine-Chondroit-Vit C-Mn (GLUCOSAMINE 1500 COMPLEX PO) Take 1 tablet by mouth daily.   Marland Kitchen levothyroxine (SYNTHROID) 75 MCG tablet Take 75 mcg by mouth daily before breakfast.   . nebivolol (BYSTOLIC) 10 MG tablet Take 0.5 tablets (5 mg total) by mouth daily.   . Omega-3 Fatty Acids (FISH OIL) 1200 MG CAPS Take 1 capsule by mouth 2 (two) times daily.   . pantoprazole (PROTONIX) 40 MG tablet Take 40 mg by mouth daily.      Allergies:   Amlodipine; Biaxin [clarithromycin]; Doxycycline; Estrogens; Felodipine; Labetalol; Macrodantin [nitrofurantoin]; Statins; Ceftin [cefuroxime]; Ciprofloxacin; and Sulfa antibiotics   Social History   Tobacco Use  . Smoking status: Never Smoker  . Smokeless tobacco: Never Used  Substance Use Topics  . Alcohol use: No    Alcohol/week: 0.0 standard drinks  . Drug use: No     Family Hx: The patient's family history includes Aneurysm in her paternal grandfather and sister; Heart attack in her paternal grandfather; Valvular heart disease in her sister.  ROS:   Please see the history of present illness.    All other systems reviewed and are negative.   Prior CV studies:   The following studies were reviewed today:  Lexiscan Myoview 12/29/2017:  No diagnostic ST segment changes to indicate ischemia.  Small, mild intensity, apical anterior defect that is fixed and consistent with breast attenuation. No significant ischemic defects noted.  This is a low risk study.  Nuclear stress EF: 81%.  Echocardiogram 08/11/2017: Study Conclusions  - Left ventricle: The cavity size was normal. Wall thickness was   normal. Systolic function was vigorous. The estimated ejection   fraction was in the range of 65% to 70%. Wall motion was normal;   there were no regional wall motion abnormalities. Doppler   parameters are consistent with abnormal left ventricular   relaxation (grade 1 diastolic dysfunction). - Aortic valve: There was mild regurgitation. - Aortic root: The aortic root was mildly dilated. - Ascending aorta: The ascending aorta was mildly dilated. Largest   diameter measured 43 mm. - Mitral valve: Mildly calcified annulus. There was mild   regurgitation. - Right atrium: Central venous pressure (est): 3 mm  Hg. - Atrial septum: No defect or patent foramen ovale was identified. - Tricuspid valve: There was mild regurgitation. - Pulmonary arteries: PA peak pressure: 26 mm Hg (S). - Pericardium, extracardiac: A prominent pericardial fat pad was   present.  Labs/Other Tests and Data Reviewed:    EKG:  An ECG dated 12/21/2017 was personally reviewed today and demonstrated:  Sinus arrhythmia.  Recent Labs: 12/12/2017: ALT 15 12/16/2017: BUN 21; Creatinine, Ser 1.27; Hemoglobin 13.4; Platelets 168; Potassium 4.9; Sodium 136 12/21/2017: TSH 4.641   Recent Lipid Panel Lab Results  Component Value Date/Time   CHOL 248 (H) 11/15/2015 05:42 AM   TRIG 87 11/15/2015 05:42 AM   HDL 48 11/15/2015 05:42 AM   CHOLHDL 5.2 11/15/2015 05:42 AM   LDLCALC 183 (H) 11/15/2015 05:42 AM    Wt Readings from Last 3 Encounters:  11/03/18 125 lb (56.7 kg)  01/30/18 136 lb (61.7 kg)  12/21/17 134 lb (60.8 kg)  Objective:    Vital Signs:  BP 127/78   Pulse 63   Ht 5\' 7"  (1.702 m)   Wt 125 lb (56.7 kg)   BMI 19.58 kg/m    Patient spoke in full sentences, not short of breath. No audible wheezing or coughing.  ASSESSMENT & PLAN:    1.  Essential hypertension with reported fluctuating blood pressure.  Plan is to continue Bystolic for now.  She will be scheduled for a 6-week office visit preceded by tracking of her blood pressure for 1 to 2 weeks and also a BMET.  She might be a reasonable candidate for low-dose chlorthalidone or Aldactone.  2.  History of ascending aortic dilatation, follow-up echocardiogram is pending in comparison to previous study from last year.  3.  History of chest pain, follow-up Lexiscan Myoview from last year was low risk.  COVID-19 Education: The signs and symptoms of COVID-19 were discussed with the patient and how to seek care for testing (follow up with PCP or arrange E-visit).  The importance of social distancing was discussed today.  Time:   Today, I have spent 10  minutes with the patient with telehealth technology discussing the above problems.     Medication Adjustments/Labs and Tests Ordered: Current medicines are reviewed at length with the patient today.  Concerns regarding medicines are outlined above.   Tests Ordered: Orders Placed This Encounter  Procedures  . Basic Metabolic Panel (BMET)    Medication Changes: No orders of the defined types were placed in this encounter.   Disposition:  Follow up 6 weeks with [place].  Signed, Rozann Lesches, MD  11/03/2018 12:28 PM    [place]

## 2018-11-03 ENCOUNTER — Other Ambulatory Visit: Payer: Self-pay

## 2018-11-03 ENCOUNTER — Telehealth (INDEPENDENT_AMBULATORY_CARE_PROVIDER_SITE_OTHER): Payer: PPO | Admitting: Cardiology

## 2018-11-03 ENCOUNTER — Encounter: Payer: Self-pay | Admitting: Cardiology

## 2018-11-03 VITALS — BP 127/78 | HR 63 | Ht 67.0 in | Wt 125.0 lb

## 2018-11-03 DIAGNOSIS — Z87898 Personal history of other specified conditions: Secondary | ICD-10-CM

## 2018-11-03 DIAGNOSIS — I10 Essential (primary) hypertension: Secondary | ICD-10-CM

## 2018-11-03 DIAGNOSIS — I77819 Aortic ectasia, unspecified site: Secondary | ICD-10-CM | POA: Diagnosis not present

## 2018-11-03 DIAGNOSIS — Z7189 Other specified counseling: Secondary | ICD-10-CM

## 2018-11-03 NOTE — Patient Instructions (Signed)
Medication Instructions: Your physician recommends that you continue on your current medications as directed. Please refer to the Current Medication list given to you today.   Labwork: BMET  just BEFORE follow up in 6 weeks  Procedures/Testing: None  Follow-Up: 6 weeks with Bernerd Pho PA-C   Any Additional Special Instructions Will Be Listed Below (If Applicable).   Take your blood pressure in the morning and at night for 1-2 weeks before apt  If you need a refill on your cardiac medications before your next appointment, please call your pharmacy.     Thank you for choosing [facility] !

## 2018-11-08 ENCOUNTER — Telehealth: Payer: Self-pay | Admitting: Cardiology

## 2018-11-08 NOTE — Telephone Encounter (Signed)

## 2018-11-09 ENCOUNTER — Ambulatory Visit (INDEPENDENT_AMBULATORY_CARE_PROVIDER_SITE_OTHER): Payer: PPO

## 2018-11-09 DIAGNOSIS — I77819 Aortic ectasia, unspecified site: Secondary | ICD-10-CM

## 2018-11-24 DIAGNOSIS — Z6822 Body mass index (BMI) 22.0-22.9, adult: Secondary | ICD-10-CM | POA: Diagnosis not present

## 2018-11-24 DIAGNOSIS — R7989 Other specified abnormal findings of blood chemistry: Secondary | ICD-10-CM | POA: Diagnosis not present

## 2018-12-12 ENCOUNTER — Other Ambulatory Visit: Payer: Self-pay

## 2018-12-12 ENCOUNTER — Other Ambulatory Visit (HOSPITAL_COMMUNITY)
Admission: RE | Admit: 2018-12-12 | Discharge: 2018-12-12 | Disposition: A | Discharge status: Home / Self Care | Payer: PPO | Source: Ambulatory Visit | Attending: Cardiology | Admitting: Cardiology

## 2018-12-12 DIAGNOSIS — I10 Essential (primary) hypertension: Secondary | ICD-10-CM | POA: Diagnosis not present

## 2018-12-12 DIAGNOSIS — L710 Perioral dermatitis: Secondary | ICD-10-CM | POA: Diagnosis not present

## 2018-12-12 DIAGNOSIS — Z85828 Personal history of other malignant neoplasm of skin: Secondary | ICD-10-CM | POA: Diagnosis not present

## 2018-12-12 DIAGNOSIS — Z08 Encounter for follow-up examination after completed treatment for malignant neoplasm: Secondary | ICD-10-CM | POA: Diagnosis not present

## 2018-12-12 LAB — BASIC METABOLIC PANEL
Anion gap: 10 (ref 5–15)
BUN: 21 mg/dL (ref 8–23)
CO2: 23 mmol/L (ref 22–32)
Calcium: 9.7 mg/dL (ref 8.9–10.3)
Chloride: 105 mmol/L (ref 98–111)
Creatinine, Ser: 1.39 mg/dL — ABNORMAL HIGH (ref 0.44–1.00)
GFR calc Af Amer: 41 mL/min — ABNORMAL LOW (ref >60)
GFR calc non Af Amer: 35 mL/min — ABNORMAL LOW (ref >60)
Glucose, Bld: 96 mg/dL (ref 70–99)
Potassium: 4.7 mmol/L (ref 3.5–5.1)
Sodium: 138 mmol/L (ref 135–145)

## 2018-12-15 ENCOUNTER — Telehealth (INDEPENDENT_AMBULATORY_CARE_PROVIDER_SITE_OTHER): Payer: PPO | Admitting: Student

## 2018-12-15 ENCOUNTER — Encounter: Payer: Self-pay | Admitting: Student

## 2018-12-15 ENCOUNTER — Other Ambulatory Visit: Payer: Self-pay

## 2018-12-15 VITALS — BP 137/81 | HR 73 | Ht 66.0 in | Wt 125.0 lb

## 2018-12-15 DIAGNOSIS — E039 Hypothyroidism, unspecified: Secondary | ICD-10-CM

## 2018-12-15 DIAGNOSIS — I77819 Aortic ectasia, unspecified site: Secondary | ICD-10-CM

## 2018-12-15 DIAGNOSIS — I10 Essential (primary) hypertension: Secondary | ICD-10-CM

## 2018-12-15 DIAGNOSIS — N183 Chronic kidney disease, stage 3 unspecified: Secondary | ICD-10-CM

## 2018-12-15 NOTE — Patient Instructions (Signed)
Medication Instructions:  Continue all current medications.  Labwork: none  Testing/Procedures: none  Follow-Up: 3-4 months  Any Other Special Instructions Will Be Listed Below (If Applicable). Your physician has requested that you regularly monitor and record your blood pressure readings at home. Please use the same machine at the same time of day to check your readings over the next 2-3 weeks.  You may bring readings to the office or call the office with them.    If you need a refill on your cardiac medications before your next appointment, please call your pharmacy.

## 2018-12-15 NOTE — Progress Notes (Signed)
Virtual Visit via Telephone Note   This visit type was conducted due to national recommendations for restrictions regarding the COVID-19 Pandemic (e.g. social distancing) in an effort to limit this patient's exposure and mitigate transmission in our community.  Due to her co-morbid illnesses, this patient is at least at moderate risk for complications without adequate follow up.  This format is felt to be most appropriate for this patient at this time.  The patient did not have access to video technology/had technical difficulties with video requiring transitioning to audio format only (telephone).  All issues noted in this document were discussed and addressed.  No physical exam could be performed with this format.  Please refer to the patient's chart for her  consent to telehealth for [facility].   Date:  12/15/2018   ID:  Allison Morris, DOB [birth year], MRN [id number]  Patient Location: Home Provider Location: Office  PCP:  Redmond School, MD  Cardiologist:  Rozann Lesches, MD  Electrophysiologist:  None   Evaluation Performed:  Follow-Up Visit  Chief Complaint: Variable BP  History of Present Illness:    Allison Morris is a 83 y.o. female with past medical history of HTN, ascending aortic dilation, and Stage 3 CKD who presents for a 6-week follow-up telehealth visit.  She most recently had a phone visit with Dr. Domenic Polite in 10/2018 and reported fluctuations in her blood pressure and had been taking Bystolic 5 mg daily with an additional dose at times. She reported prior intolerances to Norvasc and Plendil while having intermittent hypotension and hyperkalemia with Lisinopril in the past. Was continued on her current regimen at that time and informed to follow BP and report back with numbers with consideration of starting low-dose Chlorthalidone or Aldactone at follow-up. A repeat echocardiogram was obtained and showed a preserved EF of 65% with mild aortic regurgitation. Her  ascending aorta measured 44 mm which was overall stable when compared to prior imaging (at 43 mm in 2019).    In talking with the patient today, she reports feeling like Synthroid was causing her variable BP and she followed up with Endocrinology and was informed to continue the medication but self-discontinued this 5 weeks ago. Since then, she reports overall feeling better and says her BP improved. She still has variability in her readings and reports SBP is typically in the 130's in the AM but can sometimes go into the 110's to 120's during the day. She feels weak and dizzy if her systolic reading is in the 110's or lower, therefore she also self-discontinued Bystolic 3-4 weeks ago. Since then, she did have one SBP reading in the 683'M and took Bystolic with improvement in her numbers. Has not utilized this in several weeks and BP was 137/81 this AM.   She denies any recent chest pain, dyspnea on exertion, orthopnea, PND, or edema. Monitors her sodium intake. Utilizes compression stockings on a daily basis.   In reviewing medications, she tells me her PCP started her on Plavix years ago since she could not tolerate statin therapy (?). Denies any known history of CAD, TIA's or prior CVA. Did have a DVT 20+ years ago.   The patient does not have symptoms concerning for COVID-19 infection (fever, chills, cough, or new shortness of breath).    Past Medical History:  Diagnosis Date  . Aortic dilatation (HCC)    a. echo 08/2017 showed EF 65-70%, grade 1 DD, mild AI, mildly dilated ascending aorta (69mm), mild MR, mild TR.   Marland Kitchen  Aortic regurgitation   . CKD (chronic kidney disease), stage III ([place])   . Essential hypertension   . History of DVT (deep vein thrombosis)    Left leg 1997   . Hyperlipemia   . Mild aortic insufficiency   . Mild mitral regurgitation   . Mild tricuspid insufficiency    Past Surgical History:  Procedure Laterality Date  . APPENDECTOMY    . CARPAL TUNNEL RELEASE    .  TONSILLECTOMY       Current Meds  Medication Sig  . acetaminophen (TYLENOL) 500 MG tablet Take 500 mg by mouth every 6 (six) hours as needed for mild pain or moderate pain.   Marland Kitchen allopurinol (ZYLOPRIM) 100 MG tablet Take 50 mg by mouth daily.  Marland Kitchen aspirin EC 81 MG EC tablet Take 1 tablet (81 mg total) by mouth daily.  . Cholecalciferol (VITAMIN D3) 1000 units CAPS Take 1 capsule by mouth daily.   . clobetasol cream (TEMOVATE) 3.08 % Apply 1 application topically daily as needed (for irritation).   . clopidogrel (PLAVIX) 75 MG tablet Take 1 tablet (75 mg total) by mouth daily.  . colchicine 0.6 MG tablet Take 0.6 mg by mouth. 1/2 tablet daily  . fexofenadine (ALLEGRA) 180 MG tablet Take 180 mg by mouth daily.  . fluticasone (FLONASE) 50 MCG/ACT nasal spray Place 2 sprays into both nostrils daily.   . Glucosamine-Chondroit-Vit C-Mn (GLUCOSAMINE 1500 COMPLEX PO) Take 1 tablet by mouth daily.   . Omega-3 Fatty Acids (FISH OIL) 1200 MG CAPS Take 1 capsule by mouth 2 (two) times daily.   . pantoprazole (PROTONIX) 40 MG tablet Take 40 mg by mouth daily.   . [DISCONTINUED] nebivolol (BYSTOLIC) 10 MG tablet Take 0.5 tablets (5 mg total) by mouth daily.     Allergies:   Amlodipine, Biaxin [clarithromycin], Doxycycline, Estrogens, Felodipine, Labetalol, Macrodantin [nitrofurantoin], Statins, Ceftin [cefuroxime], Ciprofloxacin, and Sulfa antibiotics   Social History   Tobacco Use  . Smoking status: Never Smoker  . Smokeless tobacco: Never Used  Substance Use Topics  . Alcohol use: No    Alcohol/week: 0.0 standard drinks  . Drug use: No     Family Hx: The patient's family history includes Aneurysm in her paternal grandfather and sister; Heart attack in her paternal grandfather; Valvular heart disease in her sister.  ROS:   Please see the history of present illness.     All other systems reviewed and are negative.   Prior CV studies:   The following studies were reviewed today:  NST:  12/2017  No diagnostic ST segment changes to indicate ischemia.  Small, mild intensity, apical anterior defect that is fixed and consistent with breast attenuation. No significant ischemic defects noted.  This is a low risk study.  Nuclear stress EF: 81%.  Echocardiogram: 11/09/2018 IMPRESSIONS   1. The left ventricle has hyperdynamic systolic function, with an ejection fraction of >65%. The cavity size was normal. Left ventricular diastolic Doppler parameters are consistent with impaired relaxation. No evidence of left ventricular regional wall  motion abnormalities.  2. The right ventricle has normal systolic function. The cavity was normal. There is no increase in right ventricular wall thickness.  3. The aortic valve is tricuspid. Mild calcification of the aortic valve. Aortic valve regurgitation is mild by color flow Doppler. Mild aortic annular calcification noted.  4. The mitral valve is grossly normal. There is mild mitral annular calcification present.  5. The tricuspid valve is grossly normal.  6. The aortic root is  normal in size and structure.  7. There is moderate dilatation of the ascending aorta measuring 44 mm.  Labs/Other Tests and Data Reviewed:    EKG:  No ECG reviewed.  Recent Labs: 12/16/2017: Hemoglobin 13.4; Platelets 168 12/21/2017: TSH 4.641 12/12/2018: BUN 21; Creatinine, Ser 1.39; Potassium 4.7; Sodium 138   Recent Lipid Panel Lab Results  Component Value Date/Time   CHOL 248 (H) 11/15/2015 05:42 AM   TRIG 87 11/15/2015 05:42 AM   HDL 48 11/15/2015 05:42 AM   CHOLHDL 5.2 11/15/2015 05:42 AM   LDLCALC 183 (H) 11/15/2015 05:42 AM    Wt Readings from Last 3 Encounters:  12/15/18 125 lb (56.7 kg)  11/03/18 125 lb (56.7 kg)  01/30/18 136 lb (61.7 kg)     Objective:    Vital Signs:  BP 137/81   Pulse 73   Ht 5\' 6"  (1.676 m)   Wt 125 lb (56.7 kg)   BMI 20.18 kg/m    General: Pleasant female sounding in NAD Psych: Normal affect. Neuro: Alert  and oriented X 3. Lungs:  Resp regular and unlabored while talking on the phone.    ASSESSMENT & PLAN:    1. HTN - numbers have been variable as outlined above and her BP is difficult to manage in the setting of her self-discontinuing medications while also reporting dizziness and weakness if SBP is in the 110's.  - BP was at 137/81 on most recent check and she has not utilized Bystolic in several weeks. I encouraged her to keep a BP log. If SBP stays in the 130's to 140's, suspect she would not be able to tolerate a daily medication given her reported symptoms with lower numbers. I also question a component of orthostasis in her readings and recommended she utilize compression stockings and increase her fluid intake. If SBP trends upwards into the 150's to 160's, would plan to restart Bystolic at a lower dose of 2.5mg  daily and titrate as needed.   2. Aortic Root Dilation - recent echocardiogram showed her ascending aorta measured 44 mm which was overall stable when compared to prior imaging (at 43 mm in 2019).   3. Stage 3 CKD - baseline creatinine of 1.2 - 1.3. Stable at 1.39 when checked earlier this month. Not on diuretic therapy. Previously intolerant to ACE-I.   4. Hypothyroidism - she recently self-discontinued Synthroid and has no interest in resuming the medication. I recommended she make Endocrinology aware of this and have repeat labs as uncontrolled hypothyroidism could also be contributing to her dizziness ands fatigue.    COVID-19 Education: The signs and symptoms of COVID-19 were discussed with the patient and how to seek care for testing (follow up with PCP or arrange E-visit).  The importance of social distancing was discussed today.  Time:   Today, I have spent 24 minutes with the patient with telehealth technology discussing the above problems.     Medication Adjustments/Labs and Tests Ordered: Current medicines are reviewed at length with the patient today.  Concerns  regarding medicines are outlined above.   Tests Ordered: No orders of the defined types were placed in this encounter.   Medication Changes: No orders of the defined types were placed in this encounter.   Follow Up:  She will keep a BP log and report back with readings in 2-3 weeks. Follow-up with Dr. Domenic Polite in 4 months.   Signed, Erma Heritage, PA-C  12/15/2018 12:08 PM    [facility]

## 2018-12-22 DIAGNOSIS — Z6822 Body mass index (BMI) 22.0-22.9, adult: Secondary | ICD-10-CM | POA: Diagnosis not present

## 2018-12-22 DIAGNOSIS — I10 Essential (primary) hypertension: Secondary | ICD-10-CM | POA: Diagnosis not present

## 2018-12-22 DIAGNOSIS — N183 Chronic kidney disease, stage 3 (moderate): Secondary | ICD-10-CM | POA: Diagnosis not present

## 2018-12-22 DIAGNOSIS — E063 Autoimmune thyroiditis: Secondary | ICD-10-CM | POA: Diagnosis not present

## 2018-12-22 DIAGNOSIS — J019 Acute sinusitis, unspecified: Secondary | ICD-10-CM | POA: Diagnosis not present

## 2018-12-22 DIAGNOSIS — M1000 Idiopathic gout, unspecified site: Secondary | ICD-10-CM | POA: Diagnosis not present

## 2018-12-26 DIAGNOSIS — I10 Essential (primary) hypertension: Secondary | ICD-10-CM | POA: Diagnosis not present

## 2018-12-26 DIAGNOSIS — Z6822 Body mass index (BMI) 22.0-22.9, adult: Secondary | ICD-10-CM | POA: Diagnosis not present

## 2018-12-26 DIAGNOSIS — K219 Gastro-esophageal reflux disease without esophagitis: Secondary | ICD-10-CM | POA: Diagnosis not present

## 2018-12-26 DIAGNOSIS — M1991 Primary osteoarthritis, unspecified site: Secondary | ICD-10-CM | POA: Diagnosis not present

## 2018-12-26 DIAGNOSIS — M109 Gout, unspecified: Secondary | ICD-10-CM | POA: Diagnosis not present

## 2019-01-02 ENCOUNTER — Telehealth: Payer: Self-pay | Admitting: Student

## 2019-01-02 NOTE — Telephone Encounter (Signed)
    Please let the patient know I reviewed her BP log. I appreciate her keeping such a detailed log. Overall, BP has been well-controlled with SBP in the 120's to 140's with occasional outliers with SBP in the 160's to 170's during which it looks like she utilized PRN Bystolic. By review of notes, she was symptomatic if SBP was lower than 130. We had previously discussed taking Bystolic 2.5mg  daily and she was not keen on this. Can again review taking this daily or she can utilize as needed as she has been doing. Please document so we will know what dose she is taking.   Signed, Erma Heritage, PA-C 01/02/2019, 11:22 AM Pager: [phone]

## 2019-01-04 NOTE — Telephone Encounter (Signed)
Called patient with test results. No answer. Unable to leave msg.  

## 2019-01-05 DIAGNOSIS — T50905A Adverse effect of unspecified drugs, medicaments and biological substances, initial encounter: Secondary | ICD-10-CM | POA: Diagnosis not present

## 2019-01-05 DIAGNOSIS — K5289 Other specified noninfective gastroenteritis and colitis: Secondary | ICD-10-CM | POA: Diagnosis not present

## 2019-01-05 DIAGNOSIS — Z6821 Body mass index (BMI) 21.0-21.9, adult: Secondary | ICD-10-CM | POA: Diagnosis not present

## 2019-01-22 ENCOUNTER — Other Ambulatory Visit: Payer: Self-pay

## 2019-01-22 DIAGNOSIS — Z20822 Contact with and (suspected) exposure to covid-19: Secondary | ICD-10-CM

## 2019-01-23 LAB — NOVEL CORONAVIRUS, NAA: SARS-CoV-2, NAA: DETECTED — AB

## 2019-01-24 ENCOUNTER — Ambulatory Visit (INDEPENDENT_AMBULATORY_CARE_PROVIDER_SITE_OTHER)
Admission: RE | Admit: 2019-01-24 | Discharge: 2019-01-24 | Disposition: A | Discharge status: Home / Self Care | Payer: PPO | Source: Ambulatory Visit

## 2019-01-24 DIAGNOSIS — U071 COVID-19: Secondary | ICD-10-CM | POA: Diagnosis not present

## 2019-01-24 DIAGNOSIS — M10071 Idiopathic gout, right ankle and foot: Secondary | ICD-10-CM | POA: Diagnosis not present

## 2019-01-24 DIAGNOSIS — M109 Gout, unspecified: Secondary | ICD-10-CM

## 2019-01-24 NOTE — ED Provider Notes (Signed)
[place] Virtual Visit via Video Note:  [name]  initiated request for Telemedicine visit with [facility] team. I connected with [name]  on 01/24/2019 at 2:42 PM  for a synchronized telemedicine visit using a telephone enabled HIPPA compliant telemedicine application. I verified that I am speaking with [name]  using two identifiers. [name], PA-C  was physically located in a [facility] Urgent care site and RAJA CAPUTI was located at a different location.   The limitations of evaluation and management by telemedicine as well as the availability of in-person appointments were discussed. Patient was informed that she  may incur a bill ( including co-pay) for this virtual visit encounter. [name]  expressed understanding and gave verbal consent to proceed with virtual visit.   [id number] 01/24/19 Arrival Time: 3664  CC: Gout flare  SUBJECTIVE: History from: patient. Allison Morris is a 83 y.o. female complains of right foot pain that began this morning.  Denies a precipitating event or specific injury, speculates it may be a gout flare.  Localizes the pain to the instep of foot.  Describes the pain as constant and sharp in character.  Has tried colchicine with improvement of symptoms.  Reports pain is 8/10.  Symptoms are made worse with walking.  Reports similar symptoms in the past related to gout flare.  Denies fever, chills, N/V, erythema, ecchymosis, effusion.     Patient tested positive for COVID.  Results returned today.    ROS: As per HPI.  All other pertinent ROS negative.     Past Medical History:  Diagnosis Date  . Aortic dilatation (HCC)    a. echo 08/2017 showed EF 65-70%, grade 1 DD, mild AI, mildly dilated ascending aorta (18mm), mild MR, mild TR.   Marland Kitchen Aortic regurgitation   . CKD (chronic kidney disease), stage III ([place])   . Essential hypertension   . History of DVT (deep vein thrombosis)    Left leg 1997    . Hyperlipemia   . Mild aortic insufficiency   . Mild mitral regurgitation   . Mild tricuspid insufficiency    Past Surgical History:  Procedure Laterality Date  . APPENDECTOMY    . CARPAL TUNNEL RELEASE    . TONSILLECTOMY     Allergies  Allergen Reactions  . Amlodipine     Caused "angina"  . Biaxin [Clarithromycin]     headache  . Doxycycline     Cramps   . Estrogens     cramps  . Felodipine     Swelling per patient, legs felt restless  . Labetalol     dizziness  . Macrodantin [Nitrofurantoin]   . Statins     cramps  . Ceftin [Cefuroxime] Palpitations and Other (See Comments)    Rapid heart beat redness  . Ciprofloxacin Rash  . Sulfa Antibiotics Itching    REDNESS   No current facility-administered medications on file prior to encounter.    Current Outpatient Medications on File Prior to Encounter  Medication Sig Dispense Refill  . nebivolol (BYSTOLIC) 5 MG tablet Take 5 mg by mouth once a week.    Marland Kitchen acetaminophen (TYLENOL) 500 MG tablet Take 500 mg by mouth every 6 (six) hours as needed for mild pain or moderate pain.     Marland Kitchen allopurinol (ZYLOPRIM) 100 MG tablet Take 50 mg by mouth daily.    Marland Kitchen aspirin EC 81 MG EC tablet Take 1 tablet (81 mg total)  by mouth daily. 100 tablet 1  . Cholecalciferol (VITAMIN D3) 1000 units CAPS Take 1 capsule by mouth daily.     . clobetasol cream (TEMOVATE) 7.40 % Apply 1 application topically daily as needed (for irritation).     . clopidogrel (PLAVIX) 75 MG tablet Take 1 tablet (75 mg total) by mouth daily. 30 tablet 2  . colchicine 0.6 MG tablet Take 0.6 mg by mouth. 1/2 tablet daily    . fexofenadine (ALLEGRA) 180 MG tablet Take 180 mg by mouth daily.    . fluticasone (FLONASE) 50 MCG/ACT nasal spray Place 2 sprays into both nostrils daily.     . Glucosamine-Chondroit-Vit C-Mn (GLUCOSAMINE 1500 COMPLEX PO) Take 1 tablet by mouth daily.     . Omega-3 Fatty Acids (FISH OIL) 1200 MG CAPS Take 1 capsule by mouth 2 (two) times daily.      . pantoprazole (PROTONIX) 40 MG tablet Take 40 mg by mouth daily.       OBJECTIVE:  Video unavailable There were no vitals filed for this visit.  General: alert; no distress HENT: no hot potato voice Lungs: normal respiratory effort; speaking in full sentences without difficulty Neurologic: No slurred speech Psychological: alert and cooperative; normal mood and affect   ASSESSMENT & PLAN:  1. Acute gout of right foot, unspecified cause    Continue with colchicine as prescribed for gout flare Elevate leg and apply ice as needed Follow up in person tomorrow if symptoms persists despite medication Follow up sooner or go to the ED if you have any new or worsening symptoms such as fever, chills, nausea, vomiting, increased redness/ swelling/ pain, worsening symptoms despite medication, etc...  I discussed the assessment and treatment plan with the patient. The patient was provided an opportunity to ask questions and all were answered. The patient agreed with the plan and demonstrated an understanding of the instructions.   The patient was advised to call back or seek an in-person evaluation if the symptoms worsen or if the condition fails to improve as anticipated.  I provided 15 minutes of non-face-to-face time during this encounter.  [place], PA-C  01/24/2019 2:42 PM     [name], PA-C 01/24/19 1442

## 2019-01-24 NOTE — Discharge Instructions (Addendum)
Continue with colchicine as prescribed for gout flare Elevate leg and apply ice as needed Follow up in person tomorrow if symptoms persists despite medication Follow up sooner or go to the ED if you have any new or worsening symptoms such as fever, chills, nausea, vomiting, increased redness/ swelling/ pain, worsening symptoms despite medication, etc..Marland Kitchen

## 2019-02-21 ENCOUNTER — Telehealth: Payer: Self-pay | Admitting: Cardiology

## 2019-02-21 NOTE — Telephone Encounter (Signed)
Returned pt call. She stated that for past two days her bp has been elevated. (170/87, 180/84) and she is worried. She has taken her bystolic several times over the past two days. She stated that her blood pressure has not came down as of yet. She asks what she should do to help bring it down. Please advise

## 2019-02-21 NOTE — Telephone Encounter (Signed)
    I would recommend that she take Bystolic 5 mg daily for the next 2 weeks and track her readings with this. She was previously utilizing the medication as needed but this can cause variability in her readings so if numbers are consistently elevated at this time, would recommend taking the medication daily for now.   Signed, Erma Heritage, PA-C 02/21/2019, 4:50 PM Pager: [phone]

## 2019-02-21 NOTE — Telephone Encounter (Signed)
Patient would like to speak regarding elevated BP readings x2 days / tg

## 2019-02-22 NOTE — Telephone Encounter (Signed)
Pt notified and voiced understanding 

## 2019-02-22 NOTE — Telephone Encounter (Signed)
Called pt, no answer. Left msg to return call to office.

## 2019-02-23 DIAGNOSIS — G4700 Insomnia, unspecified: Secondary | ICD-10-CM | POA: Diagnosis not present

## 2019-03-13 IMAGING — NM NM MYOCAR MULTI W/SPECT W/WALL MOTION & EF
2 series · 12 of 12 positions shown · non-contrast
Comparison: none

[Series 1: rest · 6.51mm/px · 6 of 64 frames shown]
[frame 6/64]
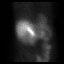
[frame 16/64]
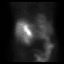
[frame 27/64]
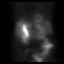
[frame 38/64]
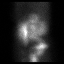
[frame 48/64]
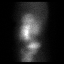
[frame 59/64]
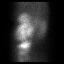

[Series 3: stress gated - perfusion · 6.51mm/px · 6 of 64 frames shown]
[frame 6/64]
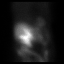
[frame 16/64]
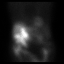
[frame 27/64]
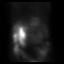
[frame 38/64]
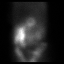
[frame 48/64]
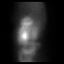
[frame 59/64]
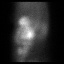

[12 of 12 positions shown; findings below may reference images not displayed]

Canned report from images found in remote index.

Refer to host system for actual result text.

## 2019-03-26 ENCOUNTER — Encounter: Payer: Self-pay | Admitting: Cardiology

## 2019-03-26 ENCOUNTER — Ambulatory Visit: Payer: PPO | Admitting: Cardiology

## 2019-03-26 ENCOUNTER — Other Ambulatory Visit: Payer: Self-pay

## 2019-03-26 VITALS — BP 180/90 | HR 79 | Temp 97.6°F | Ht 66.0 in | Wt 126.0 lb

## 2019-03-26 DIAGNOSIS — I77819 Aortic ectasia, unspecified site: Secondary | ICD-10-CM

## 2019-03-26 DIAGNOSIS — I351 Nonrheumatic aortic (valve) insufficiency: Secondary | ICD-10-CM

## 2019-03-26 DIAGNOSIS — I10 Essential (primary) hypertension: Secondary | ICD-10-CM | POA: Diagnosis not present

## 2019-03-26 MED ORDER — CHLORTHALIDONE 25 MG PO TABS: 12.5000 mg | ORAL_TABLET | Freq: Every day | ORAL | 3 refills | Status: DC

## 2019-03-26 NOTE — Progress Notes (Signed)
Cardiology Office Note  Date: 03/26/2019   ID: Allison Morris, DOB 1935-07-13, MRN [id number]  PCP:  Redmond School, MD  Cardiologist:  Rozann Lesches, MD Electrophysiologist:  None   Chief Complaint  Patient presents with  . Cardiac follow-up    History of Present Illness: Allison Morris is an 83 y.o. female last assessed via telehealth encounter in May.  She presents for a follow-up visit today.  We reviewed her home blood pressure and heart rate checks.  She continues to feel like taking Synthroid previously was running up her blood pressure and decided on her own to stop the medication.  She did not discuss this with Dr. Forde Dandy.  Although worsening hypertension is not typically described to me by patient is taking Synthroid, it is listed as a potential side effect.  I talked with her about considering use of a low-dose diuretic such as chlorthalidone.  She has actually not been taking her Bystolic on a regular basis, only when her blood pressure is up.  It looks like her average blood pressure is around 130-140, but she does have spikes into the 170s and 180s.  Follow-up echocardiogram in June revealed LVEF greater than 65%, mild aortic regurgitation, and moderate dilatation of the ascending aorta measuring 44 mm.  Lab work from July is outlined below.  Past Medical History:  Diagnosis Date  . Aortic dilatation (HCC)    a. echo 08/2017 showed EF 65-70%, grade 1 DD, mild AI, mildly dilated ascending aorta (34mm), mild MR, mild TR.   Marland Kitchen Aortic regurgitation   . CKD (chronic kidney disease), stage III   . Essential hypertension   . History of DVT (deep vein thrombosis)    Left leg 1997   . Hyperlipemia   . Mild aortic insufficiency   . Mild mitral regurgitation   . Mild tricuspid insufficiency     Past Surgical History:  Procedure Laterality Date  . APPENDECTOMY    . CARPAL TUNNEL RELEASE    . TONSILLECTOMY      Current Outpatient Medications  Medication Sig  Dispense Refill  . acetaminophen (TYLENOL) 500 MG tablet Take 500 mg by mouth every 6 (six) hours as needed for mild pain or moderate pain.     Marland Kitchen allopurinol (ZYLOPRIM) 100 MG tablet Take 50 mg by mouth daily.    Marland Kitchen aspirin EC 81 MG EC tablet Take 1 tablet (81 mg total) by mouth daily. 100 tablet 1  . Cholecalciferol (VITAMIN D3) 1000 units CAPS Take 1 capsule by mouth daily.     . clobetasol cream (TEMOVATE) [id number] % Apply 1 application topically daily as needed (for irritation).     . clopidogrel (PLAVIX) 75 MG tablet Take 1 tablet (75 mg total) by mouth daily. 30 tablet 2  . colchicine 0.6 MG tablet Take 0.6 mg by mouth. 1/2 tablet daily    . fexofenadine (ALLEGRA) 180 MG tablet Take 180 mg by mouth daily.    . fluticasone (FLONASE) 50 MCG/ACT nasal spray Place 2 sprays into both nostrils daily.     . Glucosamine-Chondroit-Vit C-Mn (GLUCOSAMINE 1500 COMPLEX PO) Take 1 tablet by mouth daily.     . Omega-3 Fatty Acids (FISH OIL) 1200 MG CAPS Take 1 capsule by mouth 2 (two) times daily.     . pantoprazole (PROTONIX) 40 MG tablet Take 40 mg by mouth daily.     . chlorthalidone (HYGROTON) 25 MG tablet Take 0.5 tablets (12.5 mg total) by mouth daily. 45 tablet 3  No current facility-administered medications for this visit.    Allergies:  Amlodipine, Biaxin [clarithromycin], Doxycycline, Estrogens, Felodipine, Labetalol, Macrodantin [nitrofurantoin], Statins, Ceftin [cefuroxime], Ciprofloxacin, and Sulfa antibiotics   Social History: The patient  reports that she has never smoked. She has never used smokeless tobacco. She reports that she does not drink alcohol or use drugs.   ROS:  Please see the history of present illness. Otherwise, complete review of systems is positive for none.  All other systems are reviewed and negative.   Physical Exam: VS:  BP (!) 180/90 (BP Location: Right Arm)   Pulse 79   Temp 97.6 F (36.4 C)   Ht 5\' 6"  (1.676 m)   Wt 126 lb (57.2 kg)   SpO2 95%   BMI 20.34  kg/m , BMI Body mass index is 20.34 kg/m.  Wt Readings from Last 3 Encounters:  03/26/19 126 lb (57.2 kg)  12/15/18 125 lb (56.7 kg)  11/03/18 125 lb (56.7 kg)    General: Elderly woman, appears comfortable at rest. HEENT: Conjunctiva and lids normal, wearing a mask. Neck: Supple, no elevated JVP or carotid bruits, no thyromegaly. Lungs: Clear to auscultation, nonlabored breathing at rest. Cardiac: Regular rate and rhythm, no S3, soft systolic murmur. Abdomen: Soft, nontender, bowel sounds present. Extremities: No pitting edema, distal pulses 2+.  ECG:  An ECG dated 12/21/2017 was personally reviewed today and demonstrated:  Sinus arrhythmia.  Recent Labwork: 12/12/2018: BUN 21; Creatinine, Ser 1.39; Potassium 4.7; Sodium 138   Other Studies Reviewed Today:  Lexiscan Myoview 12/29/2017:  No diagnostic ST segment changes to indicate ischemia.  Small, mild intensity, apical anterior defect that is fixed and consistent with breast attenuation. No significant ischemic defects noted.  This is a low risk study.  Nuclear stress EF: 81%.  Echocardiogram 11/09/2018:  1. The left ventricle has hyperdynamic systolic function, with an ejection fraction of >65%. The cavity size was normal. Left ventricular diastolic Doppler parameters are consistent with impaired relaxation. No evidence of left ventricular regional wall  motion abnormalities.  2. The right ventricle has normal systolic function. The cavity was normal. There is no increase in right ventricular wall thickness.  3. The aortic valve is tricuspid. Mild calcification of the aortic valve. Aortic valve regurgitation is mild by color flow Doppler. Mild aortic annular calcification noted.  4. The mitral valve is grossly normal. There is mild mitral annular calcification present.  5. The tricuspid valve is grossly normal.  6. The aortic root is normal in size and structure.  7. There is moderate dilatation of the ascending aorta  measuring 44 mm.  Assessment and Plan:  1.  Essential hypertension with fluctuating blood pressures.  We are going to try institution of chlorthalidone 12.5 mg daily with follow-up BMP T in 2 weeks.  She does have some degree of renal insufficiency which will need to be followed.  I am hopeful that this will provide a more stable blood pressure lowering response than her using Bystolic intermittently.  2.  Hypothyroidism, on Synthroid.  She feels very much like this medication was causing her blood pressure to go up.  Not a commonly described side effect to me at least, but I do see hypertension listed as a potential concern.  I have asked her to reach out to Dr. Forde Dandy.  3.  Mildly dilated aortic root, echocardiogram in June demonstrated dilatation at 44 mm.  She is asymptomatic.  Would like to get a better handle on blood pressure control.  Medication  Adjustments/Labs and Tests Ordered: Current medicines are reviewed at length with the patient today.  Concerns regarding medicines are outlined above.   Tests Ordered: Orders Placed This Encounter  Procedures  . Basic Metabolic Panel (BMET)    Medication Changes: Meds ordered this encounter  Medications  . chlorthalidone (HYGROTON) 25 MG tablet    Sig: Take 0.5 tablets (12.5 mg total) by mouth daily.    Dispense:  45 tablet    Refill:  3    [id number] bystolic stopped    Disposition:  Follow up 1 week in the [place] office with [place].  Signed, Satira Sark, MD, [facility] 03/26/2019 1:22 PM    [facility] at [facility] [address], [place], [place] [address] Phone: [phone]; Fax: [phone]

## 2019-03-26 NOTE — Patient Instructions (Signed)
Medication Instructions:  STOP Bystolic   START Chlorthalidone 12.5 mg daily  *If you need a refill on your cardiac medications before your next appointment, please call your pharmacy*  Lab Work: BMET in 2 weeks (04/09/2019)  If you have labs (blood work) drawn today and your tests are completely normal, you will receive your results only by: Marland Kitchen MyChart Message (if you have MyChart) OR . A paper copy in the mail If you have any lab test that is abnormal or we need to change your treatment, we will call you to review the results.  Testing/Procedures: None today  Follow-Up: At [facility], you and your health needs are our priority.  As part of our continuing mission to provide you with exceptional heart care, we have created designated Provider Care Teams.  These Care Teams include your primary Cardiologist (physician) and Advanced Practice Providers (APPs -  Physician Assistants and Nurse Practitioners) who all work together to provide you with the care you need, when you need it.  Your next appointment:   4 weeks with Bernerd Pho, PA-C  The format for your next appointment:   In Person  Provider:   Bernerd Pho, PA-C  Other Instructions None

## 2019-04-02 ENCOUNTER — Telehealth: Payer: Self-pay

## 2019-04-02 DIAGNOSIS — I10 Essential (primary) hypertension: Secondary | ICD-10-CM

## 2019-04-02 MED ORDER — SPIRONOLACTONE 25 MG PO TABS: 12.5000 mg | ORAL_TABLET | Freq: Every day | ORAL | 3 refills | Status: DC

## 2019-04-02 NOTE — Telephone Encounter (Signed)
Left detailed message on pt's private voicemail. Changed medications in chart, placed lab order.

## 2019-04-02 NOTE — Addendum Note (Signed)
Addended by: Debbora Lacrosse R on: 04/02/2019 12:40 PM   Modules accepted: Orders

## 2019-04-02 NOTE — Telephone Encounter (Signed)
We could try and switch her to Aldactone 12.5 mg daily.  She still needs to have the follow-up BMET in 7 days if she tolerates that medication.

## 2019-04-02 NOTE — Telephone Encounter (Signed)
Pt called in stating that she feels bad since starting on chlorthalidone. She feels like she is "dry" and it is causing her mouth and throat to feel gritty and very hoarse. Her blood pressure went up to 102/101 yesterday and she had to take a bystolic to bring it down. She would like to try something other than the chlorthalidone 12.5 mg daily. Please advise.

## 2019-04-03 NOTE — Telephone Encounter (Signed)
Patient asking for someone to call her.

## 2019-04-03 NOTE — Telephone Encounter (Signed)
Pt notified of med changed and will call with update on Friday

## 2019-04-05 NOTE — Telephone Encounter (Signed)
Pt called stating she can't take the spironolactone (ALDACTONE) 25 MG tablet [id number]  It's making her throat dry, mouth hurts and it's causing her to cough a lot.

## 2019-04-05 NOTE — Telephone Encounter (Signed)
Will forward to Dr. McDowell as an FYI.  

## 2019-04-09 ENCOUNTER — Other Ambulatory Visit (HOSPITAL_COMMUNITY)
Admission: RE | Admit: 2019-04-09 | Discharge: 2019-04-09 | Disposition: A | Discharge status: Home / Self Care | Payer: PPO | Source: Ambulatory Visit | Attending: Cardiology | Admitting: Cardiology

## 2019-04-09 DIAGNOSIS — I10 Essential (primary) hypertension: Secondary | ICD-10-CM | POA: Diagnosis not present

## 2019-04-09 LAB — BASIC METABOLIC PANEL
Anion gap: 12 (ref 5–15)
BUN: 28 mg/dL — ABNORMAL HIGH (ref 8–23)
CO2: 24 mmol/L (ref 22–32)
Calcium: 9.7 mg/dL (ref 8.9–10.3)
Chloride: 102 mmol/L (ref 98–111)
Creatinine, Ser: 1.53 mg/dL — ABNORMAL HIGH (ref 0.44–1.00)
GFR calc Af Amer: 37 mL/min — ABNORMAL LOW (ref >60)
GFR calc non Af Amer: 32 mL/min — ABNORMAL LOW (ref >60)
Glucose, Bld: 116 mg/dL — ABNORMAL HIGH (ref 70–99)
Potassium: 3.7 mmol/L (ref 3.5–5.1)
Sodium: 138 mmol/L (ref 135–145)

## 2019-04-10 DIAGNOSIS — N1832 Chronic kidney disease, stage 3b: Secondary | ICD-10-CM | POA: Diagnosis not present

## 2019-04-10 DIAGNOSIS — N189 Chronic kidney disease, unspecified: Secondary | ICD-10-CM | POA: Diagnosis not present

## 2019-04-16 DIAGNOSIS — N1832 Chronic kidney disease, stage 3b: Secondary | ICD-10-CM | POA: Diagnosis not present

## 2019-04-16 DIAGNOSIS — N189 Chronic kidney disease, unspecified: Secondary | ICD-10-CM | POA: Diagnosis not present

## 2019-04-16 DIAGNOSIS — N2581 Secondary hyperparathyroidism of renal origin: Secondary | ICD-10-CM | POA: Diagnosis not present

## 2019-04-23 DIAGNOSIS — D631 Anemia in chronic kidney disease: Secondary | ICD-10-CM | POA: Diagnosis not present

## 2019-04-23 DIAGNOSIS — N2581 Secondary hyperparathyroidism of renal origin: Secondary | ICD-10-CM | POA: Diagnosis not present

## 2019-04-23 DIAGNOSIS — Z23 Encounter for immunization: Secondary | ICD-10-CM | POA: Diagnosis not present

## 2019-04-23 DIAGNOSIS — N1832 Chronic kidney disease, stage 3b: Secondary | ICD-10-CM | POA: Diagnosis not present

## 2019-04-23 DIAGNOSIS — I129 Hypertensive chronic kidney disease with stage 1 through stage 4 chronic kidney disease, or unspecified chronic kidney disease: Secondary | ICD-10-CM | POA: Diagnosis not present

## 2019-04-24 ENCOUNTER — Other Ambulatory Visit: Payer: Self-pay

## 2019-04-24 ENCOUNTER — Ambulatory Visit: Payer: PPO | Admitting: Student

## 2019-04-24 ENCOUNTER — Encounter: Payer: Self-pay | Admitting: Student

## 2019-04-24 VITALS — BP 160/91 | HR 62 | Temp 97.5°F | Ht 66.5 in | Wt 126.0 lb

## 2019-04-24 DIAGNOSIS — I77819 Aortic ectasia, unspecified site: Secondary | ICD-10-CM

## 2019-04-24 DIAGNOSIS — I10 Essential (primary) hypertension: Secondary | ICD-10-CM

## 2019-04-24 DIAGNOSIS — E039 Hypothyroidism, unspecified: Secondary | ICD-10-CM

## 2019-04-24 DIAGNOSIS — N183 Chronic kidney disease, stage 3 unspecified: Secondary | ICD-10-CM | POA: Diagnosis not present

## 2019-04-24 MED ORDER — NEBIVOLOL HCL 2.5 MG PO TABS: 2.5000 mg | ORAL_TABLET | Freq: Every day | ORAL | 6 refills | Status: DC

## 2019-04-24 NOTE — Patient Instructions (Addendum)
Medication Instructions:  Take Bystolic 2.5 mg daily at dinner with food.  All other medications stay the same.  *If you need a refill on your cardiac medications before your next appointment, please call your pharmacy*  Lab Work: None today If you have labs (blood work) drawn today and your tests are completely normal, you will receive your results only by: Marland Kitchen MyChart Message (if you have MyChart) OR . A paper copy in the mail If you have any lab test that is abnormal or we need to change your treatment, we will call you to review the results.  Testing/Procedures: None today  Follow-Up: At [facility], you and your health needs are our priority.  As part of our continuing mission to provide you with exceptional heart care, we have created designated Provider Care Teams.  These Care Teams include your primary Cardiologist (physician) and Advanced Practice Providers (APPs -  Physician Assistants and Nurse Practitioners) who all work together to provide you with the care you need, when you need it.  Your next appointment:    January 4 th , 2020,  1 pm Dr.McDowell  The format for your next appointment:   In Person  Provider:   Dr.McDowell   Other Instructions Drop off BP log in 2 weeks      Thank you for choosing [facility] !

## 2019-04-24 NOTE — Progress Notes (Signed)
Cardiology Office Note    Date:  04/24/2019   ID:  Allison Morris, DOB [birth year], MRN [id number]  PCP:  Redmond School, MD  Cardiologist: Rozann Lesches, MD    Chief Complaint  Patient presents with  . Follow-up    variable BP    History of Present Illness:    Allison Morris is a 83 y.o. female with past medical history of HTN, Hypothyroidism, Stage 3 CKD and aortic root dilation (at 44 mm by recent echo in 11/2018) who presents to the office today for 4-week follow-up.   She was last examined by Dr. Domenic Polite in 03/2019 and had self-discontinued Synthroid due to thinking this was causing elevated BP. Was not taking her Bystolic regularly and BP log showed her SBP was typically in the 130's to 140's but would peak into the 170's. She was started on Chlorthalidone 12.5mg  daily at the time of her visit in an effort to avoid the use of intermittent Bystolic as she previously did not tolerate taking this on a daily basis.   She called the office several days after reporting a dry mouth since starting Chlorthalidone and it was recommended to try Aldactone 12.5mg  daily. She called several days later reporting her mouth was hurting and dry, therefore she stopped the medication.  Labs on 11/2 did show her creatinine had trended up to 1.53 but K+ was stable at 3.7.  In talking with the patient today, she reports having dizziness and fatigue if SBP is in the 110's or lower but if in the 160's to 170's she becomes flushed and has a headache. Denies any exertional chest pain or dyspnea on exertion. No recent orthopnea, PND, or edema.   Past Medical History:  Diagnosis Date  . Aortic dilatation (HCC)    a. echo 08/2017 showed EF 65-70%, grade 1 DD, mild AI, mildly dilated ascending aorta (57mm), mild MR, mild TR.   Marland Kitchen Aortic regurgitation   . CKD (chronic kidney disease), stage III   . Essential hypertension   . History of DVT (deep vein thrombosis)    Left leg 1997   . Hyperlipemia   .  Mild aortic insufficiency   . Mild mitral regurgitation   . Mild tricuspid insufficiency     Past Surgical History:  Procedure Laterality Date  . APPENDECTOMY    . CARPAL TUNNEL RELEASE    . TONSILLECTOMY      Current Medications: Outpatient Medications Prior to Visit  Medication Sig Dispense Refill  . acetaminophen (TYLENOL) 500 MG tablet Take 500 mg by mouth every 6 (six) hours as needed for mild pain or moderate pain.     Marland Kitchen allopurinol (ZYLOPRIM) 100 MG tablet Take 50 mg by mouth daily.    Marland Kitchen aspirin EC 81 MG EC tablet Take 1 tablet (81 mg total) by mouth daily. 100 tablet 1  . Cholecalciferol (VITAMIN D3) 1000 units CAPS Take 1 capsule by mouth daily.     . clobetasol cream (TEMOVATE) [id number] % Apply 1 application topically daily as needed (for irritation).     . clopidogrel (PLAVIX) 75 MG tablet Take 1 tablet (75 mg total) by mouth daily. 30 tablet 2  . colchicine 0.6 MG tablet Take 0.6 mg by mouth. 1/2 tablet daily    . fexofenadine (ALLEGRA) 180 MG tablet Take 180 mg by mouth daily.    . fluticasone (FLONASE) 50 MCG/ACT nasal spray Place 2 sprays into both nostrils daily.     . Glucosamine-Chondroit-Vit C-Mn (GLUCOSAMINE 1500  COMPLEX PO) Take 1 tablet by mouth daily.     . Omega-3 Fatty Acids (FISH OIL) 1200 MG CAPS Take 1 capsule by mouth 2 (two) times daily.     . pantoprazole (PROTONIX) 40 MG tablet Take 40 mg by mouth daily.     Marland Kitchen spironolactone (ALDACTONE) 25 MG tablet Take 0.5 tablets (12.5 mg total) by mouth daily. 45 tablet 3   No facility-administered medications prior to visit.      Allergies:   Amlodipine, Biaxin [clarithromycin], Doxycycline, Estrogens, Felodipine, Labetalol, Lisinopril, Losartan, Macrodantin [nitrofurantoin], Statins, Ceftin [cefuroxime], Chlorthalidone, Ciprofloxacin, and Sulfa antibiotics   Social History   Socioeconomic History  . Marital status: Married    Spouse name: Not on file  . Number of children: Not on file  . Years of education:  Not on file  . Highest education level: Not on file  Occupational History  . Not on file  Social Needs  . Financial resource strain: Not on file  . Food insecurity    Worry: Not on file    Inability: Not on file  . Transportation needs    Medical: Not on file    Non-medical: Not on file  Tobacco Use  . Smoking status: Never Smoker  . Smokeless tobacco: Never Used  Substance and Sexual Activity  . Alcohol use: No    Alcohol/week: 0.0 standard drinks  . Drug use: No  . Sexual activity: Not on file  Lifestyle  . Physical activity    Days per week: Not on file    Minutes per session: Not on file  . Stress: Not on file  Relationships  . Social Herbalist on phone: Not on file    Gets together: Not on file    Attends religious service: Not on file    Active member of club or organization: Not on file    Attends meetings of clubs or organizations: Not on file    Relationship status: Not on file  Other Topics Concern  . Not on file  Social History Narrative  . Not on file     Family History:  The patient's family history includes Aneurysm in her paternal grandfather and sister; Heart attack in her paternal grandfather; Valvular heart disease in her sister.   Review of Systems:   Please see the history of present illness.     General:  No chills, fever, night sweats or weight changes.  Cardiovascular:  No chest pain, dyspnea on exertion, edema, orthopnea, palpitations, paroxysmal nocturnal dyspnea. Dermatological: No rash, lesions/masses Respiratory: No cough, dyspnea Urologic: No hematuria, dysuria Abdominal:   No nausea, vomiting, diarrhea, bright red blood per rectum, melena, or hematemesis Neurologic:  No visual changes, wkns, changes in mental status. Positive for intermittent headaches and dizziness with elevated or low BP.   All other systems reviewed and are otherwise negative except as noted above.   Physical Exam:    VS:  BP (!) 160/91   Pulse 62    Temp (!) 97.5 F (36.4 C)   Ht 5' 6.5" (1.689 m)   Wt 126 lb (57.2 kg)   SpO2 94%   BMI 20.03 kg/m    General: Well developed, well nourished,female appearing in no acute distress. Head: Normocephalic, atraumatic, sclera non-icteric, no xanthomas, nares are without discharge.  Neck: No carotid bruits. JVD not elevated.  Lungs: Respirations regular and unlabored, without wheezes or rales.  Heart: Regular rate and rhythm. No S3 or S4.  No murmur, no  rubs, or gallops appreciated. Abdomen: Soft, non-tender, non-distended with normoactive bowel sounds. No hepatomegaly. No rebound/guarding. No obvious abdominal masses. Msk:  Strength and tone appear normal for age. No joint deformities or effusions. Extremities: No clubbing or cyanosis. No lower extremity edema.  Distal pedal pulses are 2+ bilaterally. Neuro: Alert and oriented X 3. Moves all extremities spontaneously. No focal deficits noted. Psych:  Responds to questions appropriately with a normal affect. Skin: No rashes or lesions noted  Wt Readings from Last 3 Encounters:  04/24/19 126 lb (57.2 kg)  03/26/19 126 lb (57.2 kg)  12/15/18 125 lb (56.7 kg)     Studies/Labs Reviewed:   EKG:  EKG is not ordered today.    Recent Labs: 04/09/2019: BUN 28; Creatinine, Ser 1.53; Potassium 3.7; Sodium 138   Lipid Panel    Component Value Date/Time   CHOL 248 (H) 11/15/2015 0542   TRIG 87 11/15/2015 0542   HDL 48 11/15/2015 0542   CHOLHDL 5.2 11/15/2015 0542   VLDL 17 11/15/2015 0542   LDLCALC 183 (H) 11/15/2015 0542    Additional studies/ records that were reviewed today include:   NST: 12/2017  No diagnostic ST segment changes to indicate ischemia.  Small, mild intensity, apical anterior defect that is fixed and consistent with breast attenuation. No significant ischemic defects noted.  This is a low risk study.  Nuclear stress EF: 81%.  Echocardiogram: 11/09/2018 IMPRESSIONS    1. The left ventricle has hyperdynamic  systolic function, with an ejection fraction of >65%. The cavity size was normal. Left ventricular diastolic Doppler parameters are consistent with impaired relaxation. No evidence of left ventricular regional wall  motion abnormalities.  2. The right ventricle has normal systolic function. The cavity was normal. There is no increase in right ventricular wall thickness.  3. The aortic valve is tricuspid. Mild calcification of the aortic valve. Aortic valve regurgitation is mild by color flow Doppler. Mild aortic annular calcification noted.  4. The mitral valve is grossly normal. There is mild mitral annular calcification present.  5. The tricuspid valve is grossly normal.  6. The aortic root is normal in size and structure.  7. There is moderate dilatation of the ascending aorta measuring 44 mm.  Assessment:    1. Essential hypertension   2. Hypothyroidism, unspecified type   3. Stage 3 chronic kidney disease, unspecified whether stage 3a or 3b CKD   4. Aortic dilatation (HCC)      Plan:   In order of problems listed above:  1. HTN - BP has been challenging to control as she has been intolerant to Amlodipine, Felodipine, ACE-I, ARB's, and other BB's (except for Bystolic) with the most recent intolerances to Spironolactone and Chlorthalidone due to her having a dry mouth with the medications. I reviewed with the patient that our options are very limited given her intolerances. Will try Bystolic 2.5mg  daily but I am unsure how long she will be able to take this on a daily basis as she is concerned it dropped her BP too low in the past. Recommended taking in the evening and with a meal. If unable to tolerate, would consider Hydralazine 12.5mg  BID.    2. Hypothyroidism - previously on Synthroid but she felt this caused elevated BP. She reports having followed up with her PCP in the interim and TSH was normal without medical therapy.  3. Stage 3 CKD - creatinine elevated to 1.53 earlier this  month. Followed by Dr. Posey Pronto.   4. Aortic Root  Dilation - at 44 mm by recent echo in 11/2018. Continue to follow. Reviewed with the patient today in addressing the importance of adequate BP control.   Medication Adjustments/Labs and Tests Ordered: Current medicines are reviewed at length with the patient today.  Concerns regarding medicines are outlined above.  Medication changes, Labs and Tests ordered today are listed in the Patient Instructions below. Patient Instructions  Medication Instructions:  Take Bystolic 2.5 mg daily at dinner with food.  All other medications stay the same.  *If you need a refill on your cardiac medications before your next appointment, please call your pharmacy*  Lab Work: None today If you have labs (blood work) drawn today and your tests are completely normal, you will receive your results only by: Marland Kitchen MyChart Message (if you have MyChart) OR . A paper copy in the mail If you have any lab test that is abnormal or we need to change your treatment, we will call you to review the results.  Testing/Procedures: None today  Follow-Up: At [facility], you and your health needs are our priority.  As part of our continuing mission to provide you with exceptional heart care, we have created designated Provider Care Teams.  These Care Teams include your primary Cardiologist (physician) and Advanced Practice Providers (APPs -  Physician Assistants and Nurse Practitioners) who all work together to provide you with the care you need, when you need it.  Your next appointment:    January 4 th , 2020,  1 pm Dr.McDowell  The format for your next appointment:   In Person  Provider:   Dr.McDowell   Other Instructions Drop off BP log in 2 weeks      Thank you for choosing [facility] !            Signed, Erma Heritage, PA-C  04/24/2019 6:43 PM    [facility] [address] [place],   [address] Phone: [phone] Fax: [phone]

## 2019-05-07 ENCOUNTER — Telehealth: Payer: Self-pay | Admitting: Student

## 2019-05-07 MED ORDER — NEBIVOLOL HCL 2.5 MG PO TABS: 2.5000 mg | ORAL_TABLET | Freq: Every day | ORAL | 3 refills | Status: DC

## 2019-05-07 NOTE — Telephone Encounter (Signed)
Needing to speak w/ someone about her meds.

## 2019-05-07 NOTE — Telephone Encounter (Signed)
Called pt, seems to be something wrong with her phone. I could not hear her speak, but I know someone picked up. Will try later.

## 2019-05-07 NOTE — Telephone Encounter (Signed)
Pt needed 90 days bystolic sent to [facility].

## 2019-05-11 ENCOUNTER — Telehealth: Payer: Self-pay | Admitting: Student

## 2019-05-11 DIAGNOSIS — Z6822 Body mass index (BMI) 22.0-22.9, adult: Secondary | ICD-10-CM | POA: Diagnosis not present

## 2019-05-11 DIAGNOSIS — H9203 Otalgia, bilateral: Secondary | ICD-10-CM | POA: Diagnosis not present

## 2019-05-11 NOTE — Telephone Encounter (Signed)
    Please let the patient know I reviewed her blood pressure log and readings have overall been well controlled with occasional outliers. Given the overall improved trend, would continue her current regimen at this time.   Signed, Erma Heritage, PA-C 05/11/2019, 11:01 AM Pager: [phone]

## 2019-05-11 NOTE — Telephone Encounter (Signed)
Pt notified and voiced understanding 

## 2019-05-16 DIAGNOSIS — M1991 Primary osteoarthritis, unspecified site: Secondary | ICD-10-CM | POA: Diagnosis not present

## 2019-05-16 DIAGNOSIS — M67441 Ganglion, right hand: Secondary | ICD-10-CM | POA: Diagnosis not present

## 2019-05-16 DIAGNOSIS — M109 Gout, unspecified: Secondary | ICD-10-CM | POA: Diagnosis not present

## 2019-05-16 DIAGNOSIS — I10 Essential (primary) hypertension: Secondary | ICD-10-CM | POA: Diagnosis not present

## 2019-05-16 DIAGNOSIS — Z6822 Body mass index (BMI) 22.0-22.9, adult: Secondary | ICD-10-CM | POA: Diagnosis not present

## 2019-05-30 DIAGNOSIS — R2231 Localized swelling, mass and lump, right upper limb: Secondary | ICD-10-CM | POA: Diagnosis not present

## 2019-05-30 DIAGNOSIS — M19242 Secondary osteoarthritis, left hand: Secondary | ICD-10-CM | POA: Diagnosis not present

## 2019-05-30 DIAGNOSIS — M19241 Secondary osteoarthritis, right hand: Secondary | ICD-10-CM | POA: Diagnosis not present

## 2019-05-30 DIAGNOSIS — M19041 Primary osteoarthritis, right hand: Secondary | ICD-10-CM | POA: Insufficient documentation

## 2019-06-05 ENCOUNTER — Other Ambulatory Visit: Payer: Self-pay | Admitting: Orthopedic Surgery

## 2019-06-05 DIAGNOSIS — R2231 Localized swelling, mass and lump, right upper limb: Secondary | ICD-10-CM

## 2019-06-11 ENCOUNTER — Telehealth: Payer: PPO | Admitting: Cardiology

## 2019-06-17 NOTE — Progress Notes (Signed)
Virtual Visit via Telephone Note   This visit type was conducted due to national recommendations for restrictions regarding the COVID-19 Pandemic (e.g. social distancing) in an effort to limit this patient's exposure and mitigate transmission in our community.  Due to her co-morbid illnesses, this patient is at least at moderate risk for complications without adequate follow up.  This format is felt to be most appropriate for this patient at this time.  The patient did not have access to video technology/had technical difficulties with video requiring transitioning to audio format only (telephone).  All issues noted in this document were discussed and addressed.  No physical exam could be performed with this format.  Please refer to the patient's chart for her  consent to telehealth for [facility].   Date:  06/18/2019   ID:  Allison Morris, DOB 23-Nov-1935, MRN [id number]  Patient Location: Home Provider Location: Office  PCP:  Redmond School, MD  Cardiologist:  Rozann Lesches, MD  Electrophysiologist:  None   Evaluation Performed:  Follow-Up Visit  Chief Complaint:  Cardiac follow-up  History of Present Illness:    Allison Morris is an 84 y.o. female last seen in November 2020 by Ms. Strader PA-C.  We spoke by phone today.  She states that she has done reasonably well.  She dropped off home blood pressure and heart rate checks which I reviewed today.  On average her blood pressure has been fairly well controlled on low-dose Bystolic.  She is tolerating this.  She has had numerous medication intolerances including Norvasc, Plendil, ACE inhibitor's and ARB's, chlorthalidone, spironolactone, and beta-blockers with the exception of Bystolic.  The patient does not have symptoms concerning for COVID-19 infection (fever, chills, cough, or new shortness of breath).  She did have COVID-19 back in August 2020, reports no obvious sequela   Past Medical History:  Diagnosis Date  . Aortic  dilatation (HCC)    a. echo 08/2017 showed EF 65-70%, grade 1 DD, mild AI, mildly dilated ascending aorta (38mm), mild MR, mild TR.   Marland Kitchen Aortic regurgitation   . CKD (chronic kidney disease), stage III   . Essential hypertension   . History of DVT (deep vein thrombosis)    Left leg 1997   . Hyperlipemia   . Mild aortic insufficiency   . Mild mitral regurgitation   . Mild tricuspid insufficiency    Past Surgical History:  Procedure Laterality Date  . APPENDECTOMY    . CARPAL TUNNEL RELEASE    . TONSILLECTOMY       Current Meds  Medication Sig  . acetaminophen (TYLENOL) 500 MG tablet Take 500 mg by mouth every 6 (six) hours as needed for mild pain or moderate pain.   Marland Kitchen allopurinol (ZYLOPRIM) 100 MG tablet Take 50 mg by mouth daily.  Marland Kitchen aspirin EC 81 MG EC tablet Take 1 tablet (81 mg total) by mouth daily.  . Cholecalciferol (VITAMIN D3) 1000 units CAPS Take 1 capsule by mouth daily.   . clobetasol cream (TEMOVATE) [id number] % Apply 1 application topically daily as needed (for irritation).   . clopidogrel (PLAVIX) 75 MG tablet Take 1 tablet (75 mg total) by mouth daily.  . colchicine 0.6 MG tablet Take 0.6 mg by mouth. 1/2 tablet daily  . fexofenadine (ALLEGRA) 180 MG tablet Take 180 mg by mouth daily.  . fluticasone (FLONASE) 50 MCG/ACT nasal spray Place 2 sprays into both nostrils daily.   . Glucosamine-Chondroit-Vit C-Mn (GLUCOSAMINE 1500 COMPLEX PO) Take 1 tablet  by mouth daily.   . nebivolol (BYSTOLIC) 2.5 MG tablet Take 1 tablet (2.5 mg total) by mouth daily. Take at dinner with food.  . Omega-3 Fatty Acids (FISH OIL) 1200 MG CAPS Take 1 capsule by mouth 2 (two) times daily.   . pantoprazole (PROTONIX) 40 MG tablet Take 40 mg by mouth daily.      Allergies:   Amlodipine, Biaxin [clarithromycin], Doxycycline, Estrogens, Felodipine, Labetalol, Lisinopril, Losartan, Macrodantin [nitrofurantoin], Statins, Ceftin [cefuroxime], Chlorthalidone, Ciprofloxacin, and Sulfa antibiotics   Social  History   Tobacco Use  . Smoking status: Never Smoker  . Smokeless tobacco: Never Used  Substance Use Topics  . Alcohol use: No    Alcohol/week: 0.0 standard drinks  . Drug use: No     Family Hx: The patient's family history includes Aneurysm in her paternal grandfather and sister; Heart attack in her paternal grandfather; Valvular heart disease in her sister.  ROS:   Please see the history of present illness. All other systems reviewed and are negative.   Prior CV studies:   The following studies were reviewed today:  Echocardiogram 11/09/2018:  1. The left ventricle has hyperdynamic systolic function, with an ejection fraction of >65%. The cavity size was normal. Left ventricular diastolic Doppler parameters are consistent with impaired relaxation. No evidence of left ventricular regional wall  motion abnormalities.  2. The right ventricle has normal systolic function. The cavity was normal. There is no increase in right ventricular wall thickness.  3. The aortic valve is tricuspid. Mild calcification of the aortic valve. Aortic valve regurgitation is mild by color flow Doppler. Mild aortic annular calcification noted.  4. The mitral valve is grossly normal. There is mild mitral annular calcification present.  5. The tricuspid valve is grossly normal.  6. The aortic root is normal in size and structure.  7. There is moderate dilatation of the ascending aorta measuring 44 mm.  Lexiscan Myoview 12/29/2017:  No diagnostic ST segment changes to indicate ischemia.  Small, mild intensity, apical anterior defect that is fixed and consistent with breast attenuation. No significant ischemic defects noted.  This is a low risk study.  Nuclear stress EF: 81%.  Labs/Other Tests and Data Reviewed:    EKG:  An ECG dated 12/21/2017 was personally reviewed today and demonstrated:  Sinus arrhythmia.  Recent Labs: 04/09/2019: BUN 28; Creatinine, Ser 1.53; Potassium 3.7; Sodium 138    Wt  Readings from Last 3 Encounters:  06/18/19 125 lb (56.7 kg)  04/24/19 126 lb (57.2 kg)  03/26/19 126 lb (57.2 kg)     Objective:    Vital Signs:  BP 133/72   Pulse (!) 58   Ht 5\' 6"  (1.676 m)   Wt 125 lb (56.7 kg)   BMI 20.18 kg/m    Patient spoke in full sentences, not short of breath. No audible wheezing or coughing. Speech pattern normal.  ASSESSMENT & PLAN:    1.  Essential hypertension, tolerating low-dose Bystolic which she is taking on a regular basis at this point.  She will continue to track blood pressure periodically at home.  No changes were made today.  2.  Mildly dilated aortic root by echocardiography, asymptomatic.  COVID-19 Education: The signs and symptoms of COVID-19 were discussed with the patient and how to seek care for testing (follow up with PCP or arrange E-visit).  The importance of social distancing was discussed today.  Time:   Today, I have spent 6 minutes with the patient with telehealth technology discussing  the above problems.     Medication Adjustments/Labs and Tests Ordered: Current medicines are reviewed at length with the patient today.  Concerns regarding medicines are outlined above.   Tests Ordered: No orders of the defined types were placed in this encounter.   Medication Changes: No orders of the defined types were placed in this encounter.   Follow Up:  In Person 6 months in the [place] office.  Signed, Rozann Lesches, MD  06/18/2019 10:58 AM    [place]

## 2019-06-18 ENCOUNTER — Other Ambulatory Visit (HOSPITAL_COMMUNITY): Payer: Self-pay | Admitting: Internal Medicine

## 2019-06-18 ENCOUNTER — Telehealth (INDEPENDENT_AMBULATORY_CARE_PROVIDER_SITE_OTHER): Payer: PPO | Admitting: Cardiology

## 2019-06-18 ENCOUNTER — Encounter: Payer: Self-pay | Admitting: Cardiology

## 2019-06-18 VITALS — BP 133/72 | HR 58 | Ht 66.0 in | Wt 125.0 lb

## 2019-06-18 DIAGNOSIS — Z1231 Encounter for screening mammogram for malignant neoplasm of breast: Secondary | ICD-10-CM

## 2019-06-18 DIAGNOSIS — N1832 Chronic kidney disease, stage 3b: Secondary | ICD-10-CM

## 2019-06-18 DIAGNOSIS — I10 Essential (primary) hypertension: Secondary | ICD-10-CM | POA: Diagnosis not present

## 2019-06-18 DIAGNOSIS — I77819 Aortic ectasia, unspecified site: Secondary | ICD-10-CM

## 2019-06-18 NOTE — Patient Instructions (Signed)
Medication Instructions:  Your physician recommends that you continue on your current medications as directed. Please refer to the Current Medication list given to you today.  *If you need a refill on your cardiac medications before your next appointment, please call your pharmacy*  Lab Work: NONE If you have labs (blood work) drawn today and your tests are completely normal, you will receive your results only by: . MyChart Message (if you have MyChart) OR . A paper copy in the mail If you have any lab test that is abnormal or we need to change your treatment, we will call you to review the results.  Testing/Procedures: NONE  Follow-Up: At [facility], you and your health needs are our priority.  As part of our continuing mission to provide you with exceptional heart care, we have created designated Provider Care Teams.  These Care Teams include your primary Cardiologist (physician) and Advanced Practice Providers (APPs -  Physician Assistants and Nurse Practitioners) who all work together to provide you with the care you need, when you need it.  Your next appointment:   6 month(s)  The format for your next appointment:   In Person  Provider:   Samuel McDowell, MD  Other Instructions NONE      Thank you for choosing [facility] !         

## 2019-06-19 DIAGNOSIS — H6983 Other specified disorders of Eustachian tube, bilateral: Secondary | ICD-10-CM | POA: Diagnosis not present

## 2019-06-19 DIAGNOSIS — H9202 Otalgia, left ear: Secondary | ICD-10-CM | POA: Diagnosis not present

## 2019-06-19 DIAGNOSIS — H903 Sensorineural hearing loss, bilateral: Secondary | ICD-10-CM | POA: Diagnosis not present

## 2019-06-20 DIAGNOSIS — Z08 Encounter for follow-up examination after completed treatment for malignant neoplasm: Secondary | ICD-10-CM | POA: Diagnosis not present

## 2019-06-20 DIAGNOSIS — Z85828 Personal history of other malignant neoplasm of skin: Secondary | ICD-10-CM | POA: Diagnosis not present

## 2019-06-20 DIAGNOSIS — L821 Other seborrheic keratosis: Secondary | ICD-10-CM | POA: Diagnosis not present

## 2019-06-20 DIAGNOSIS — L218 Other seborrheic dermatitis: Secondary | ICD-10-CM | POA: Diagnosis not present

## 2019-06-29 ENCOUNTER — Other Ambulatory Visit: Payer: Self-pay

## 2019-06-29 ENCOUNTER — Other Ambulatory Visit: Payer: Self-pay | Admitting: Orthopedic Surgery

## 2019-06-29 ENCOUNTER — Ambulatory Visit
Admission: RE | Admit: 2019-06-29 | Discharge: 2019-06-29 | Disposition: A | Discharge status: Home / Self Care | Payer: PPO | Source: Ambulatory Visit | Attending: Orthopedic Surgery | Admitting: Orthopedic Surgery

## 2019-06-29 DIAGNOSIS — R2231 Localized swelling, mass and lump, right upper limb: Secondary | ICD-10-CM

## 2019-07-04 DIAGNOSIS — R2231 Localized swelling, mass and lump, right upper limb: Secondary | ICD-10-CM | POA: Diagnosis not present

## 2019-07-05 ENCOUNTER — Other Ambulatory Visit: Payer: Self-pay | Admitting: Orthopedic Surgery

## 2019-07-05 DIAGNOSIS — K219 Gastro-esophageal reflux disease without esophagitis: Secondary | ICD-10-CM | POA: Diagnosis not present

## 2019-07-05 DIAGNOSIS — E041 Nontoxic single thyroid nodule: Secondary | ICD-10-CM | POA: Diagnosis not present

## 2019-07-05 DIAGNOSIS — I10 Essential (primary) hypertension: Secondary | ICD-10-CM | POA: Diagnosis not present

## 2019-07-05 DIAGNOSIS — Z1389 Encounter for screening for other disorder: Secondary | ICD-10-CM | POA: Diagnosis not present

## 2019-07-05 DIAGNOSIS — Z13220 Encounter for screening for lipoid disorders: Secondary | ICD-10-CM | POA: Diagnosis not present

## 2019-07-05 DIAGNOSIS — Z0001 Encounter for general adult medical examination with abnormal findings: Secondary | ICD-10-CM | POA: Diagnosis not present

## 2019-07-05 DIAGNOSIS — Z6821 Body mass index (BMI) 21.0-21.9, adult: Secondary | ICD-10-CM | POA: Diagnosis not present

## 2019-07-06 ENCOUNTER — Other Ambulatory Visit (HOSPITAL_COMMUNITY): Payer: Self-pay | Admitting: Family Medicine

## 2019-07-06 DIAGNOSIS — E2839 Other primary ovarian failure: Secondary | ICD-10-CM

## 2019-07-16 ENCOUNTER — Other Ambulatory Visit: Payer: Self-pay

## 2019-07-16 ENCOUNTER — Ambulatory Visit (HOSPITAL_COMMUNITY)
Admission: RE | Admit: 2019-07-16 | Discharge: 2019-07-16 | Disposition: A | Discharge status: Home / Self Care | Payer: PPO | Source: Ambulatory Visit | Attending: Internal Medicine | Admitting: Internal Medicine

## 2019-07-16 ENCOUNTER — Ambulatory Visit (HOSPITAL_COMMUNITY)
Admission: RE | Admit: 2019-07-16 | Discharge: 2019-07-16 | Disposition: A | Discharge status: Home / Self Care | Payer: PPO | Source: Ambulatory Visit | Attending: Family Medicine | Admitting: Family Medicine

## 2019-07-16 DIAGNOSIS — E2839 Other primary ovarian failure: Secondary | ICD-10-CM

## 2019-07-16 DIAGNOSIS — Z780 Asymptomatic menopausal state: Secondary | ICD-10-CM | POA: Diagnosis not present

## 2019-07-16 DIAGNOSIS — M810 Age-related osteoporosis without current pathological fracture: Secondary | ICD-10-CM | POA: Diagnosis not present

## 2019-07-16 DIAGNOSIS — Z1231 Encounter for screening mammogram for malignant neoplasm of breast: Secondary | ICD-10-CM | POA: Diagnosis not present

## 2019-07-25 DIAGNOSIS — M1000 Idiopathic gout, unspecified site: Secondary | ICD-10-CM | POA: Diagnosis not present

## 2019-07-25 DIAGNOSIS — Z6821 Body mass index (BMI) 21.0-21.9, adult: Secondary | ICD-10-CM | POA: Diagnosis not present

## 2019-07-25 DIAGNOSIS — M79644 Pain in right finger(s): Secondary | ICD-10-CM | POA: Diagnosis not present

## 2019-07-31 ENCOUNTER — Encounter (HOSPITAL_BASED_OUTPATIENT_CLINIC_OR_DEPARTMENT_OTHER): Payer: Self-pay | Admitting: Orthopedic Surgery

## 2019-07-31 ENCOUNTER — Other Ambulatory Visit: Payer: Self-pay

## 2019-07-31 NOTE — Progress Notes (Signed)
Patient chart and ECHO results reviewed with Dr Daiva Huge, [place] to be done at [facility].

## 2019-08-02 ENCOUNTER — Encounter (HOSPITAL_COMMUNITY)
Admission: RE | Admit: 2019-08-02 | Discharge: 2019-08-02 | Disposition: A | Discharge status: Home / Self Care | Payer: PPO | Source: Ambulatory Visit | Attending: Orthopedic Surgery | Admitting: Orthopedic Surgery

## 2019-08-02 ENCOUNTER — Other Ambulatory Visit: Payer: Self-pay

## 2019-08-02 DIAGNOSIS — I10 Essential (primary) hypertension: Secondary | ICD-10-CM | POA: Insufficient documentation

## 2019-08-02 DIAGNOSIS — Z01810 Encounter for preprocedural cardiovascular examination: Secondary | ICD-10-CM | POA: Insufficient documentation

## 2019-08-02 DIAGNOSIS — Z01812 Encounter for preprocedural laboratory examination: Secondary | ICD-10-CM | POA: Diagnosis not present

## 2019-08-02 LAB — BASIC METABOLIC PANEL
Anion gap: 8 (ref 5–15)
BUN: 31 mg/dL — ABNORMAL HIGH (ref 8–23)
CO2: 24 mmol/L (ref 22–32)
Calcium: 9.2 mg/dL (ref 8.9–10.3)
Chloride: 105 mmol/L (ref 98–111)
Creatinine, Ser: 1.39 mg/dL — ABNORMAL HIGH (ref 0.44–1.00)
GFR calc Af Amer: 41 mL/min — ABNORMAL LOW (ref >60)
GFR calc non Af Amer: 35 mL/min — ABNORMAL LOW (ref >60)
Glucose, Bld: 91 mg/dL (ref 70–99)
Potassium: 4.5 mmol/L (ref 3.5–5.1)
Sodium: 137 mmol/L (ref 135–145)

## 2019-08-03 ENCOUNTER — Other Ambulatory Visit (HOSPITAL_COMMUNITY)
Admission: RE | Admit: 2019-08-03 | Discharge: 2019-08-03 | Disposition: A | Discharge status: Home / Self Care | Payer: PPO | Source: Ambulatory Visit | Attending: Orthopedic Surgery | Admitting: Orthopedic Surgery

## 2019-08-03 DIAGNOSIS — Z20822 Contact with and (suspected) exposure to covid-19: Secondary | ICD-10-CM | POA: Insufficient documentation

## 2019-08-03 DIAGNOSIS — Z01812 Encounter for preprocedural laboratory examination: Secondary | ICD-10-CM | POA: Insufficient documentation

## 2019-08-03 LAB — SARS CORONAVIRUS 2 (TAT 6-24 HRS): SARS Coronavirus 2: NEGATIVE

## 2019-08-07 ENCOUNTER — Ambulatory Visit (HOSPITAL_BASED_OUTPATIENT_CLINIC_OR_DEPARTMENT_OTHER): Payer: PPO | Admitting: Anesthesiology

## 2019-08-07 ENCOUNTER — Other Ambulatory Visit: Payer: Self-pay

## 2019-08-07 ENCOUNTER — Encounter (HOSPITAL_BASED_OUTPATIENT_CLINIC_OR_DEPARTMENT_OTHER): Payer: Self-pay | Admitting: Orthopedic Surgery

## 2019-08-07 ENCOUNTER — Ambulatory Visit (HOSPITAL_BASED_OUTPATIENT_CLINIC_OR_DEPARTMENT_OTHER)
Admission: RE | Admit: 2019-08-07 | Discharge: 2019-08-07 | Disposition: A | Discharge status: Home / Self Care | Payer: PPO | Attending: Orthopedic Surgery | Admitting: Orthopedic Surgery

## 2019-08-07 ENCOUNTER — Encounter (HOSPITAL_BASED_OUTPATIENT_CLINIC_OR_DEPARTMENT_OTHER)
Admission: RE | Disposition: A | Discharge status: Home / Self Care | Payer: Self-pay | Source: Home / Self Care | Attending: Orthopedic Surgery

## 2019-08-07 DIAGNOSIS — M85641 Other cyst of bone, right hand: Secondary | ICD-10-CM | POA: Diagnosis not present

## 2019-08-07 DIAGNOSIS — Z86718 Personal history of other venous thrombosis and embolism: Secondary | ICD-10-CM | POA: Insufficient documentation

## 2019-08-07 DIAGNOSIS — L729 Follicular cyst of the skin and subcutaneous tissue, unspecified: Secondary | ICD-10-CM | POA: Insufficient documentation

## 2019-08-07 DIAGNOSIS — N183 Chronic kidney disease, stage 3 unspecified: Secondary | ICD-10-CM | POA: Diagnosis not present

## 2019-08-07 DIAGNOSIS — M109 Gout, unspecified: Secondary | ICD-10-CM | POA: Diagnosis not present

## 2019-08-07 DIAGNOSIS — E785 Hyperlipidemia, unspecified: Secondary | ICD-10-CM | POA: Diagnosis not present

## 2019-08-07 DIAGNOSIS — M19041 Primary osteoarthritis, right hand: Secondary | ICD-10-CM | POA: Insufficient documentation

## 2019-08-07 DIAGNOSIS — G459 Transient cerebral ischemic attack, unspecified: Secondary | ICD-10-CM | POA: Diagnosis not present

## 2019-08-07 DIAGNOSIS — I10 Essential (primary) hypertension: Secondary | ICD-10-CM | POA: Diagnosis not present

## 2019-08-07 DIAGNOSIS — K219 Gastro-esophageal reflux disease without esophagitis: Secondary | ICD-10-CM | POA: Diagnosis not present

## 2019-08-07 DIAGNOSIS — I129 Hypertensive chronic kidney disease with stage 1 through stage 4 chronic kidney disease, or unspecified chronic kidney disease: Secondary | ICD-10-CM | POA: Insufficient documentation

## 2019-08-07 DIAGNOSIS — M71341 Other bursal cyst, right hand: Secondary | ICD-10-CM | POA: Diagnosis not present

## 2019-08-07 HISTORY — DX: Other seasonal allergic rhinitis: J30.2

## 2019-08-07 HISTORY — PX: MASS EXCISION: SHX2000

## 2019-08-07 HISTORY — DX: Gastro-esophageal reflux disease without esophagitis: K21.9

## 2019-08-07 HISTORY — DX: Unspecified osteoarthritis, unspecified site: M19.90

## 2019-08-07 SURGERY — EXCISION MASS
Anesthesia: Regional | Site: Hand | Laterality: Right

## 2019-08-07 MED ORDER — FENTANYL CITRATE (PF) 100 MCG/2ML IJ SOLN: 25.0000 ug | INTRAMUSCULAR | Status: DC | PRN

## 2019-08-07 MED ORDER — PROPOFOL 10 MG/ML IV BOLUS
INTRAVENOUS | Status: DC | PRN
  Administered 2019-08-07 (×2): 10 mg via INTRAVENOUS

## 2019-08-07 MED ORDER — FENTANYL CITRATE (PF) 100 MCG/2ML IJ SOLN
INTRAMUSCULAR | Status: AC
  Filled 2019-08-07: qty 2

## 2019-08-07 MED ORDER — MIDAZOLAM HCL 2 MG/2ML IJ SOLN
INTRAMUSCULAR | Status: AC
  Filled 2019-08-07: qty 2

## 2019-08-07 MED ORDER — OXYCODONE HCL 5 MG PO TABS: 5.0000 mg | ORAL_TABLET | Freq: Once | ORAL | Status: DC | PRN

## 2019-08-07 MED ORDER — 0.9 % SODIUM CHLORIDE (POUR BTL) OPTIME
TOPICAL | Status: DC | PRN
  Administered 2019-08-07: 100 mL

## 2019-08-07 MED ORDER — BUPIVACAINE HCL (PF) 0.25 % IJ SOLN
INTRAMUSCULAR | Status: DC | PRN
  Administered 2019-08-07: 4 mL

## 2019-08-07 MED ORDER — LIDOCAINE 2% (20 MG/ML) 5 ML SYRINGE
INTRAMUSCULAR | Status: AC
  Filled 2019-08-07: qty 5

## 2019-08-07 MED ORDER — PHENYLEPHRINE 40 MCG/ML (10ML) SYRINGE FOR IV PUSH (FOR BLOOD PRESSURE SUPPORT)
PREFILLED_SYRINGE | INTRAVENOUS | Status: AC
  Filled 2019-08-07: qty 10

## 2019-08-07 MED ORDER — ONDANSETRON HCL 4 MG/2ML IJ SOLN
INTRAMUSCULAR | Status: DC | PRN
  Administered 2019-08-07: 4 mg via INTRAVENOUS

## 2019-08-07 MED ORDER — EPHEDRINE 5 MG/ML INJ
INTRAVENOUS | Status: AC
  Filled 2019-08-07: qty 10

## 2019-08-07 MED ORDER — ONDANSETRON HCL 4 MG/2ML IJ SOLN
INTRAMUSCULAR | Status: AC
  Filled 2019-08-07: qty 2

## 2019-08-07 MED ORDER — CHLORHEXIDINE GLUCONATE 4 % EX LIQD: 60.0000 mL | Freq: Once | CUTANEOUS | Status: DC

## 2019-08-07 MED ORDER — OXYCODONE HCL 5 MG/5ML PO SOLN: 5.0000 mg | Freq: Once | ORAL | Status: DC | PRN

## 2019-08-07 MED ORDER — FENTANYL CITRATE (PF) 100 MCG/2ML IJ SOLN
50.0000 ug | INTRAMUSCULAR | Status: DC | PRN
  Administered 2019-08-07: 25 ug via INTRAVENOUS

## 2019-08-07 MED ORDER — VANCOMYCIN HCL IN DEXTROSE 1-5 GM/200ML-% IV SOLN
1000.0000 mg | INTRAVENOUS | Status: AC
  Administered 2019-08-07: 1000 mg via INTRAVENOUS

## 2019-08-07 MED ORDER — TRAMADOL HCL 50 MG PO TABS: 50.0000 mg | ORAL_TABLET | Freq: Four times a day (QID) | ORAL | 0 refills | Status: DC | PRN

## 2019-08-07 MED ORDER — ONDANSETRON HCL 4 MG/2ML IJ SOLN: 4.0000 mg | Freq: Once | INTRAMUSCULAR | Status: DC | PRN

## 2019-08-07 MED ORDER — PROPOFOL 500 MG/50ML IV EMUL
INTRAVENOUS | Status: AC
  Filled 2019-08-07: qty 50

## 2019-08-07 MED ORDER — MIDAZOLAM HCL 2 MG/2ML IJ SOLN: 1.0000 mg | INTRAMUSCULAR | Status: DC | PRN

## 2019-08-07 MED ORDER — LACTATED RINGERS IV SOLN: INTRAVENOUS | Status: DC

## 2019-08-07 MED ORDER — LIDOCAINE HCL (PF) 0.5 % IJ SOLN
INTRAMUSCULAR | Status: DC | PRN
  Administered 2019-08-07: 30 mL via INTRAVENOUS

## 2019-08-07 MED ORDER — VANCOMYCIN HCL IN DEXTROSE 1-5 GM/200ML-% IV SOLN
INTRAVENOUS | Status: AC
  Filled 2019-08-07: qty 200

## 2019-08-07 MED ORDER — SUCCINYLCHOLINE CHLORIDE 200 MG/10ML IV SOSY
PREFILLED_SYRINGE | INTRAVENOUS | Status: AC
  Filled 2019-08-07: qty 10

## 2019-08-07 SURGICAL SUPPLY — 53 items
APL PRP STRL LF DISP 70% ISPRP (MISCELLANEOUS) ×1
BLADE SURG 15 STRL LF DISP TIS (BLADE) ×1 IMPLANT
BLADE SURG 15 STRL SS (BLADE) ×3
BNDG CMPR 9X4 STRL LF SNTH (GAUZE/BANDAGES/DRESSINGS) ×1
BNDG COHESIVE 1X5 TAN STRL LF (GAUZE/BANDAGES/DRESSINGS) IMPLANT
BNDG COHESIVE 2X5 TAN STRL LF (GAUZE/BANDAGES/DRESSINGS) ×2 IMPLANT
BNDG COHESIVE 3X5 TAN STRL LF (GAUZE/BANDAGES/DRESSINGS) IMPLANT
BNDG ESMARK 4X9 LF (GAUZE/BANDAGES/DRESSINGS) ×2 IMPLANT
BNDG GAUZE ELAST 4 BULKY (GAUZE/BANDAGES/DRESSINGS) ×2 IMPLANT
CHLORAPREP W/TINT 26 (MISCELLANEOUS) ×3 IMPLANT
CORD BIPOLAR FORCEPS 12FT (ELECTRODE) ×3 IMPLANT
COVER BACK TABLE 60X90IN (DRAPES) ×3 IMPLANT
COVER MAYO STAND STRL (DRAPES) ×3 IMPLANT
COVER WAND RF STERILE (DRAPES) IMPLANT
CUFF TOURN SGL QUICK 18X4 (TOURNIQUET CUFF) ×2 IMPLANT
DECANTER SPIKE VIAL GLASS SM (MISCELLANEOUS) ×2 IMPLANT
DRAIN PENROSE 1/2X12 LTX STRL (WOUND CARE) IMPLANT
DRAPE EXTREMITY T 121X128X90 (DISPOSABLE) ×3 IMPLANT
DRAPE SURG 17X23 STRL (DRAPES) ×3 IMPLANT
GAUZE SPONGE 4X4 12PLY STRL (GAUZE/BANDAGES/DRESSINGS) ×3 IMPLANT
GAUZE XEROFORM 1X8 LF (GAUZE/BANDAGES/DRESSINGS) ×3 IMPLANT
GLOVE BIO SURGEON STRL SZ7 (GLOVE) ×2 IMPLANT
GLOVE BIOGEL PI IND STRL 6.5 (GLOVE) IMPLANT
GLOVE BIOGEL PI IND STRL 7.0 (GLOVE) IMPLANT
GLOVE BIOGEL PI IND STRL 8.5 (GLOVE) ×1 IMPLANT
GLOVE BIOGEL PI INDICATOR 6.5 (GLOVE) ×2
GLOVE BIOGEL PI INDICATOR 7.0 (GLOVE) ×2
GLOVE BIOGEL PI INDICATOR 8.5 (GLOVE) ×2
GLOVE ECLIPSE 6.5 STRL STRAW (GLOVE) ×2 IMPLANT
GLOVE SKINSENSE NS SZ7.5 (GLOVE) ×2
GLOVE SKINSENSE STRL SZ7.5 (GLOVE) IMPLANT
GLOVE SURG ORTHO 8.0 STRL STRW (GLOVE) ×3 IMPLANT
GOWN STRL REUS W/ TWL LRG LVL3 (GOWN DISPOSABLE) ×1 IMPLANT
GOWN STRL REUS W/TWL LRG LVL3 (GOWN DISPOSABLE) ×6
GOWN STRL REUS W/TWL XL LVL3 (GOWN DISPOSABLE) ×3 IMPLANT
NDL PRECISIONGLIDE 27X1.5 (NEEDLE) ×1 IMPLANT
NDL SAFETY ECLIPSE 18X1.5 (NEEDLE) IMPLANT
NEEDLE HYPO 18GX1.5 SHARP (NEEDLE)
NEEDLE PRECISIONGLIDE 27X1.5 (NEEDLE) ×3 IMPLANT
NS IRRIG 1000ML POUR BTL (IV SOLUTION) ×3 IMPLANT
PACK BASIN DAY SURGERY FS (CUSTOM PROCEDURE TRAY) ×3 IMPLANT
PAD CAST 3X4 CTTN HI CHSV (CAST SUPPLIES) IMPLANT
PADDING CAST COTTON 3X4 STRL (CAST SUPPLIES) ×3
SLEEVE SCD COMPRESS KNEE MED (MISCELLANEOUS) ×2 IMPLANT
SPLINT PLASTER CAST XFAST 3X15 (CAST SUPPLIES) IMPLANT
SPLINT PLASTER XTRA FASTSET 3X (CAST SUPPLIES)
STOCKINETTE 4X48 STRL (DRAPES) ×3 IMPLANT
SUT ETHILON 4 0 PS 2 18 (SUTURE) ×3 IMPLANT
SUT VIC AB 4-0 P2 18 (SUTURE) ×2 IMPLANT
SYR BULB 3OZ (MISCELLANEOUS) ×3 IMPLANT
SYR CONTROL 10ML LL (SYRINGE) ×3 IMPLANT
TOWEL GREEN STERILE FF (TOWEL DISPOSABLE) ×3 IMPLANT
UNDERPAD 30X36 HEAVY ABSORB (UNDERPADS AND DIAPERS) ×1 IMPLANT

## 2019-08-07 NOTE — Anesthesia Preprocedure Evaluation (Addendum)
Anesthesia Evaluation  Patient identified by MRN, date of birth, ID band Patient awake    Reviewed: Allergy & Precautions, NPO status , Patient's Chart, lab work & pertinent test results  History of Anesthesia Complications Negative for: history of anesthetic complications  Airway Mallampati: II  TM Distance: >3 FB Neck ROM: Full    Dental  (+) Dental Advisory Given   Pulmonary neg pulmonary ROS,    Pulmonary exam normal        Cardiovascular hypertension, Pt. on medications and Pt. on home beta blockers + DVT  Normal cardiovascular exam   '20 TTE - EF >65%. Diastolic Doppler parameters are consistent with impaired relaxation. Mild AI.Moderate dilatation of the ascending aorta measuring 44 mm.     Neuro/Psych TIAnegative psych ROS   GI/Hepatic Neg liver ROS, GERD  Medicated and Controlled,  Endo/Other  negative endocrine ROS  Renal/GU CRFRenal disease     Musculoskeletal  (+) Arthritis ,   Abdominal   Peds  Hematology  On plavix    Anesthesia Other Findings Covid neg 08/03/19   Reproductive/Obstetrics                            Anesthesia Physical Anesthesia Plan  ASA: III  Anesthesia Plan: Bier Block and Bier Block-LIDOCAINE ONLY   Post-op Pain Management:    Induction: Intravenous  PONV Risk Score and Plan: 2 and Propofol infusion and Treatment may vary due to age or medical condition  Airway Management Planned: Natural Airway and Simple Face Mask  Additional Equipment: None  Intra-op Plan:   Post-operative Plan:   Informed Consent: I have reviewed the patients History and Physical, chart, labs and discussed the procedure including the risks, benefits and alternatives for the proposed anesthesia with the patient or authorized representative who has indicated his/her understanding and acceptance.       Plan Discussed with: CRNA and Anesthesiologist  Anesthesia Plan  Comments:         Anesthesia Quick Evaluation

## 2019-08-07 NOTE — H&P (Signed)
Allison Morris is an 84 y.o. female.   Chief Complaint: cyst right handHPI: Allison Morris is a 84 year old right-hand-dominant former patient has not been seen in20+ years. She comes in at the referral of Dr. Riley Kill for consultation regarding a mass on the dorsal aspect between the index middle fingers of her right hand at the metacarpal phalangeal joint level. This been present for approximately a year. She recalls no history of injury. She complains of only mild discomfort with using it. This occurs primarily after the heavy housework. She has not taken anything for this. She has a history of gout no history of diabetes thyroid problems arthritis. Family history is positive arthritis negative for the remainder. She has used blue emu and glucosamine chondroitin sulfate for her arthritis she is on colchicine. She is status post carpal tunnel release done in 1996. This was done by myself.She was referred for ultrasound. . The ultrasound has been done and read out by Dr. Zigmund Daniel as a multilobulated cyst rather than giant cell tumor. He feels that it is likely coming from the index metacarpal phalangeal joint.     Past Medical History:  Diagnosis Date  . Aortic dilatation (HCC)    a. echo 08/2017 showed EF 65-70%, grade 1 DD, mild AI, mildly dilated ascending aorta (28mm), mild MR, mild TR.   Marland Kitchen Aortic regurgitation   . Arthritis    hands  . CKD (chronic kidney disease), stage III   . Essential hypertension   . GERD (gastroesophageal reflux disease)   . History of DVT (deep vein thrombosis)    Left leg 1997   . Hyperlipemia   . Mild aortic insufficiency   . Mild mitral regurgitation   . Mild tricuspid insufficiency   . Seasonal allergies     Past Surgical History:  Procedure Laterality Date  . APPENDECTOMY    . CARPAL TUNNEL RELEASE    . TONSILLECTOMY      Family History  Problem Relation Age of Onset  . Aneurysm Sister   . Valvular heart disease Sister   . Aneurysm Paternal Grandfather   .  Heart attack Paternal Grandfather    Social History:  reports that she has never smoked. She has never used smokeless tobacco. She reports that she does not drink alcohol or use drugs.  Allergies:  Allergies  Allergen Reactions  . Amlodipine     Caused "angina"  . Biaxin [Clarithromycin]     headache  . Doxycycline     Cramps   . Estrogens     cramps  . Felodipine     Swelling per patient, legs felt restless  . Labetalol     dizziness  . Lisinopril     Cough  . Losartan     "Angina" symptoms  . Macrodantin [Nitrofurantoin]   . Statins     cramps  . Ceftin [Cefuroxime] Palpitations and Other (See Comments)    Rapid heart beat redness  . Chlorthalidone     Dry Mouth  . Ciprofloxacin Rash  . Sulfa Antibiotics Itching    REDNESS    No medications prior to admission.    No results found for this or any previous visit (from the past 48 hour(s)).  No results found.   Pertinent items are noted in HPI.  Height 5\' 6"  (1.676 m), weight 56.7 kg.  General appearance: alert, cooperative and appears stated age Head: Normocephalic, without obvious abnormality Neck: no JVD Resp: clear to auscultation bilaterally Cardio: regular rate and rhythm, S1, S2  normal, no murmur, click, rub or gallop GI: soft, non-tender; bowel sounds normal; no masses,  no organomegaly Extremities: mass right hand Pulses: 2+ and symmetric Skin: Skin color, texture, turgor normal. No rashes or lesions Neurologic: Grossly normal Incision/Wound: na  Assessment/Plan  Assessment:  1. Mass of finger of right hand    Plan:  Have discussed surgical excision with her. Preperi-and postoperative course been discussed along with risk and complications. She is aware there is no guarantee to the surgery the possibility of infection recurrence injury to arteries nerves tendons complete relief symptoms dystrophy. She is scheduled for excision mass possible debridement metacarpal phalangeal joint index and  middle finger right hand as an outpatient under regional anesthesia.   Daryll Brod 08/07/2019, 5:09 AM

## 2019-08-07 NOTE — Transfer of Care (Signed)
Immediate Anesthesia Transfer of Care Note  Patient: Allison Morris  Procedure(s) Performed: EXCISION CYST RIGHT INDEX AND RIGHT MIDDLE FINGERS WITH DEBRIDEMENT OF METACARPALPHALANGEAL JOINTS (Right Hand)  Patient Location: PACU  Anesthesia Type:MAC and Bier block  Level of Consciousness: awake, alert , oriented and drowsy  Airway & Oxygen Therapy: Patient Spontanous Breathing and Patient connected to face mask oxygen  Post-op Assessment: Report given to RN and Post -op Vital signs reviewed and stable  Post vital signs: Reviewed and stable  Last Vitals:  Vitals Value Taken Time  BP    Temp    Pulse 55 08/07/19 1018  Resp 16 08/07/19 1018  SpO2 97 % 08/07/19 1018  Vitals shown include unvalidated device data.  Last Pain:  Vitals:   08/07/19 0836  TempSrc: Oral  PainSc: 0-No pain      Patients Stated Pain Goal: 3 (00/76/22 6333)  Complications: No apparent anesthesia complications

## 2019-08-07 NOTE — Op Note (Signed)
NAME: Allison Morris, Allison Morris FACILITY: [facility] LOCATION: [facility] PHYSICIAN: Wynonia Sours, MD   OPERATIVE REPORT   DATE OF PROCEDURE: 08/07/19    PREOPERATIVE DIAGNOSIS:   Cyst index middle webspace right hand  POSTOPERATIVE DIAGNOSIS:   Same   PROCEDURE:   Excision cyst with synovectomy metacarpal phalangeal joint right hand   SURGEON: Daryll Brod, M.D.   ASSISTANT: none   ANESTHESIA:  Bier block with sedation and Local   INTRAVENOUS FLUIDS:  Per anesthesia flow sheet.   ESTIMATED BLOOD LOSS:  Minimal.   COMPLICATIONS:  None.   SPECIMENS:   Cyst and synovial tissue   TOURNIQUET TIME:    Total Tourniquet Time Documented: Forearm (Right) - 29 minutes Total: Forearm (Right) - 29 minutes    DISPOSITION:  Stable to PACU.   INDICATIONS: Patient is an 84 year old female with a history of a mass in the interspace between the index and middle fingers metacarpal heads right hand this was extremely firm multilobulated ultrasound revealed a multilobulated cyst rather than giant cell.  She has elected to have this removed.  Preperi-and postoperative course were discussed along with risks and complications.  She is aware that there is no guarantee to the surgery the possibility of infection recurrence injury to arteries nerves tendons complete relief symptoms dystrophy.  Preoperative area the patient seen the extremity marked by both patient and surgeon antibiotic given  OPERATIVE COURSE: Patient brought to the operating room where form based IV regional anesthetic was carried out without difficulty under the direction of the anesthesia department.  She was prepped using ChloraPrep in the supine position with the right arm free.  3-minute dry time was allowed and a timeout taken to confirm patient procedure.  A longitudinal incision was made directly over the mass in the webspace between the index and middle metacarpal  heads of the right hand.  This carried down through subcutaneous tissue.  Multilobulated cyst was immediately encountered the dorsal nerves of the index middle finger identified protected.  The cyst was blunt and sharply dissected and followed down to the metacarpal phalangeal joint of the index finger.  Moderate arthritis was present a dorsal synovectomy was performed with rondure's.  The specimen was sent to pathology.  The wound was copiously irrigated with saline.  The subcutaneous tissue was closed erupted 4-0 Vicryl.  The skin was closed with interrupted 4-0 nylon sutures.  Local infiltration quarter percent bupivacaine without epinephrine was given approximately 6 cc was used.  A sterile compressive dressing was applied.  Inflation of the tourniquet all fingers immediately pink.  She was taken to the recovery room for observation in satisfactory condition.  She will be discharged home to return [place] in 1 week on Tylenol ibuprofen for pain with Ultram for breakthrough.   Daryll Brod, MD Electronically signed, 08/07/19

## 2019-08-07 NOTE — Brief Op Note (Signed)
08/07/2019  10:19 AM  PATIENT:  Allison Morris  84 y.o. female  PRE-OPERATIVE DIAGNOSIS:  CYST DORSAL RIGHT HAND INDEX AND MIDDLE FINGERS  POST-OPERATIVE DIAGNOSIS:  CYST DORSAL RIGHT HAND INDEX AND MIDDLE FINGERS  PROCEDURE:  Procedure(s): EXCISION CYST RIGHT INDEX AND RIGHT MIDDLE FINGERS WITH DEBRIDEMENT OF METACARPALPHALANGEAL JOINTS (Right)  SURGEON:  Surgeon(s) and Role:    * Daryll Brod, MD - Primary  PHYSICIAN ASSISTANT:   ASSISTANTS: none   ANESTHESIA:   local, regional and IV sedation  EBL:  2 mL   BLOOD ADMINISTERED:none  DRAINS: none   LOCAL MEDICATIONS USED:  BUPIVICAINE   SPECIMEN:  Excision  DISPOSITION OF SPECIMEN:  PATHOLOGY  COUNTS:  YES  TOURNIQUET:   Total Tourniquet Time Documented: Forearm (Right) - 29 minutes Total: Forearm (Right) - 29 minutes   DICTATION: .Viviann Spare Dictation  PLAN OF CARE: Discharge to home after PACU  PATIENT DISPOSITION:  PACU - hemodynamically stable.

## 2019-08-07 NOTE — Anesthesia Postprocedure Evaluation (Signed)
Anesthesia Post Note  Patient: Allison Morris  Procedure(s) Performed: EXCISION CYST RIGHT INDEX AND RIGHT MIDDLE FINGERS WITH DEBRIDEMENT OF METACARPALPHALANGEAL JOINTS (Right Hand)     Patient location during evaluation: PACU Anesthesia Type: Bier Block Level of consciousness: awake and alert Pain management: pain level controlled Vital Signs Assessment: post-procedure vital signs reviewed and stable Respiratory status: spontaneous breathing, nonlabored ventilation and respiratory function stable Cardiovascular status: stable and blood pressure returned to baseline Anesthetic complications: no    Last Vitals:  Vitals:   08/07/19 1100 08/07/19 1110  BP: (!) 166/71   Pulse: (!) 50 (!) 50  Resp: 11 16  Temp:    SpO2: 100% 100%    Last Pain:  Vitals:   08/07/19 1100  TempSrc:   PainSc: 0-No pain                 Audry Pili

## 2019-08-07 NOTE — Discharge Instructions (Addendum)

## 2019-08-08 LAB — SURGICAL PATHOLOGY

## 2019-08-16 DIAGNOSIS — Z6821 Body mass index (BMI) 21.0-21.9, adult: Secondary | ICD-10-CM | POA: Diagnosis not present

## 2019-08-16 DIAGNOSIS — M130 Polyarthritis, unspecified: Secondary | ICD-10-CM | POA: Diagnosis not present

## 2019-08-20 ENCOUNTER — Other Ambulatory Visit: Payer: Self-pay

## 2019-08-20 ENCOUNTER — Encounter: Payer: Self-pay | Admitting: Emergency Medicine

## 2019-08-20 ENCOUNTER — Ambulatory Visit
Admission: EM | Admit: 2019-08-20 | Discharge: 2019-08-20 | Disposition: A | Discharge status: Home / Self Care | Payer: PPO | Attending: Family Medicine | Admitting: Family Medicine

## 2019-08-20 DIAGNOSIS — M25471 Effusion, right ankle: Secondary | ICD-10-CM

## 2019-08-20 NOTE — Discharge Instructions (Signed)
Please call [phone] in the morning to schedule ultrasound Elevate foot Use ACE wrap for compression   Follow up in emergency room if symptoms worsening, developing chest pain  Your blood pressure was elevated today in clinic. Please be sure to take blood pressure medications as prescribed. Please monitor your blood pressure at home or when you go to a [facility]. Please follow up with your primary care doctor to recheck blood pressure and discuss any need for medication changes.   Please go to Emergency Room if you start to experience severe headache, vision changes, decreased urine production, chest pain, shortness of breath, speech slurring, one sided weakness.

## 2019-08-20 NOTE — ED Triage Notes (Signed)
Pt sts swelling and pain to right ankle that is some improved after seeing PCP and given meds for gout; pt sts concerned because she has a hx of blood clots and was off her blood thinners for and sx recently; pt noted to have htn; PA aware; pt sts had second covid vaccines and Friday and not feeling well from that as well

## 2019-08-21 ENCOUNTER — Ambulatory Visit (HOSPITAL_COMMUNITY)
Admission: RE | Admit: 2019-08-21 | Discharge: 2019-08-21 | Disposition: A | Discharge status: Home / Self Care | Payer: PPO | Source: Ambulatory Visit | Attending: Emergency Medicine | Admitting: Emergency Medicine

## 2019-08-21 ENCOUNTER — Telehealth: Payer: Self-pay | Admitting: Emergency Medicine

## 2019-08-21 DIAGNOSIS — M25471 Effusion, right ankle: Secondary | ICD-10-CM | POA: Diagnosis not present

## 2019-08-21 DIAGNOSIS — R600 Localized edema: Secondary | ICD-10-CM | POA: Diagnosis not present

## 2019-08-21 NOTE — Telephone Encounter (Signed)
Wrong laterality placed on order yesterday.

## 2019-08-21 NOTE — ED Provider Notes (Signed)
[facility]    CSN: [id number] Arrival date & time: 08/20/19  1624      History   Chief Complaint Chief Complaint  Patient presents with  . Leg Swelling    HPI Allison Morris is a 84 y.o. female history of CKD stage III, HTN, GERD, Aortic regurgitation, prior DVT presenting today for evaluation of right ankle swelling.  Patient notes recently she developed right ankle pain and swelling, she was seen by her PCP and had steroid shot to help with what thought was thought to be gout.  Patient states that her pain has fully resolved, but she continues to have right ankle swelling.  She is concerned as she has had prior DVTs.  She is on Plavix, but recently was off this for approximately 5 days when she had right hand cyst excision on 3/2.  She states that she has been back on her Plavix for approximately 1 week and believes her symptoms started last Wednesday.  She denies any injury or fall.  Has been unable to wear compression stockings due to swelling.  Discussed with patient her elevated blood pressure.  Patient typically takes her blood pressure medicine around 530/6 PM.  She has not taken this today.  She does report sensation of some chest pressure.  Denies difficulty breathing or shortness of breath.  She experiences this chest pressure intermittently off and on.  Denies association with exertion.  Denies headache or vision changes.  Denies difficulty speaking or one-sided weakness.  She did recently have her second Covid vaccine and has been feeling fatigued from this.   HPI  Past Medical History:  Diagnosis Date  . Aortic dilatation (HCC)    a. echo 08/2017 showed EF 65-70%, grade 1 DD, mild AI, mildly dilated ascending aorta (64mm), mild MR, mild TR.   Marland Kitchen Aortic regurgitation   . Arthritis    hands  . CKD (chronic kidney disease), stage III   . Essential hypertension   . GERD (gastroesophageal reflux disease)   . History of DVT (deep vein thrombosis)    Left leg 1997    . Hyperlipemia   . Mild aortic insufficiency   . Mild mitral regurgitation   . Mild tricuspid insufficiency   . Seasonal allergies     Patient Active Problem List   Diagnosis Date Noted  . Orthostatic lightheadedness 12/11/2017  . TIA (transient ischemic attack) 11/14/2015  . Hypertension   . Hyperlipemia   . History of DVT (deep vein thrombosis)   . Aortic dilatation [facility])     Past Surgical History:  Procedure Laterality Date  . APPENDECTOMY    . CARPAL TUNNEL RELEASE    . MASS EXCISION Right 08/07/2019   Procedure: EXCISION CYST RIGHT INDEX AND RIGHT MIDDLE FINGERS WITH DEBRIDEMENT OF METACARPALPHALANGEAL JOINTS;  Surgeon: Daryll Brod, MD;  Location: [place];  Service: Orthopedics;  Laterality: Right;  . TONSILLECTOMY      OB History   No obstetric history on file.      Home Medications    Prior to Admission medications   Medication Sig Start Date End Date Taking? Authorizing Provider  acetaminophen (TYLENOL) 500 MG tablet Take 500 mg by mouth every 6 (six) hours as needed for mild pain or moderate pain.     [provider]  aspirin EC 81 MG EC tablet Take 1 tablet (81 mg total) by mouth daily. 11/15/15   Orvan Falconer, MD  Cholecalciferol (VITAMIN D3) 1000 units CAPS Take 1  capsule by mouth daily.     [provider]  clobetasol cream (TEMOVATE) [id number] % Apply 1 application topically daily as needed (for irritation).     [provider]  clopidogrel (PLAVIX) 75 MG tablet Take 1 tablet (75 mg total) by mouth daily. 11/15/15   Orvan Falconer, MD  colchicine 0.6 MG tablet Take 0.6 mg by mouth. 1/2 tablet daily    [provider]  fexofenadine (ALLEGRA) 180 MG tablet Take 180 mg by mouth daily.    [provider]  fluticasone (FLONASE) 50 MCG/ACT nasal spray Place 2 sprays into both nostrils daily.     [provider]  Glucosamine-Chondroit-Vit C-Mn (GLUCOSAMINE 1500 COMPLEX PO) Take 1 tablet by mouth daily.      [provider]  nebivolol (BYSTOLIC) 2.5 MG tablet Take 1 tablet (2.5 mg total) by mouth daily. Take at dinner with food. 05/07/19   Strader, Fransisco Hertz, PA-C  Omega-3 Fatty Acids (FISH OIL) 1200 MG CAPS Take 1 capsule by mouth 2 (two) times daily.     [provider]  pantoprazole (PROTONIX) 40 MG tablet Take 40 mg by mouth daily.     [provider]  traMADol (ULTRAM) 50 MG tablet Take 1 tablet (50 mg total) by mouth every 6 (six) hours as needed. 08/07/19   Daryll Brod, MD    Family History Family History  Problem Relation Age of Onset  . Aneurysm Sister   . Valvular heart disease Sister   . Aneurysm Paternal Grandfather   . Heart attack Paternal Grandfather     Social History Social History   Tobacco Use  . Smoking status: Never Smoker  . Smokeless tobacco: Never Used  Substance Use Topics  . Alcohol use: No    Alcohol/week: 0.0 standard drinks  . Drug use: No     Allergies   Amlodipine, Biaxin [clarithromycin], Doxycycline, Estrogens, Felodipine, Labetalol, Lisinopril, Losartan, Macrodantin [nitrofurantoin], Statins, Ceftin [cefuroxime], Chlorthalidone, Ciprofloxacin, and Sulfa antibiotics   Review of Systems Review of Systems  Constitutional: Negative for fatigue and fever.  HENT: Negative for congestion, sinus pressure and sore throat.   Eyes: Negative for photophobia, pain and visual disturbance.  Respiratory: Negative for cough and shortness of breath.   Cardiovascular: Positive for chest pain and leg swelling.  Gastrointestinal: Negative for abdominal pain, nausea and vomiting.  Genitourinary: Negative for decreased urine volume and hematuria.  Musculoskeletal: Positive for joint swelling. Negative for myalgias, neck pain and neck stiffness.  Neurological: Negative for dizziness, syncope, facial asymmetry, speech difficulty, weakness, light-headedness, numbness and headaches.     Physical Exam Triage Vital Signs ED Triage Vitals  [08/20/19 1652]  Enc Vitals Group     BP (!) 209/94     Pulse Rate 71     Resp 18     Temp 98.3 F (36.8 C)     Temp Source Oral     SpO2 97 %     Weight      Height      Head Circumference      Peak Flow      Pain Score 3     Pain Loc      Pain Edu?      Excl. in [place]?    No data found.  Updated Vital Signs BP (!) 179/90 (BP Location: Right Arm)   Pulse 71   Temp 98.3 F (36.8 C) (Oral)   Resp 18   SpO2 97%   Visual Acuity Right Eye Distance:  Left Eye Distance:   Bilateral Distance:    Right Eye Near:   Left Eye Near:    Bilateral Near:     Physical Exam Vitals and nursing note reviewed.  Constitutional:      Appearance: She is well-developed.     Comments: No acute distress  HENT:     Head: Normocephalic and atraumatic.     Nose: Nose normal.  Eyes:     Conjunctiva/sclera: Conjunctivae normal.  Cardiovascular:     Rate and Rhythm: Normal rate.  Pulmonary:     Effort: Pulmonary effort is normal. No respiratory distress.  Abdominal:     General: There is no distension.  Musculoskeletal:        General: Normal range of motion.     Cervical back: Neck supple.     Comments: Right ankle with moderate swelling compared to left, 1-2+ pitting noted below ankle, no overlying erythema or warmth, nontender to palpation, swelling does not extend into calf, no calf tenderness to palpation  Right foot with similar coolness compared to left, cap refill approximately 3 seconds bilaterally  Skin:    General: Skin is warm and dry.  Neurological:     Mental Status: She is alert and oriented to person, place, and time.      UC Treatments / Results  Labs (all labs ordered are listed, but only abnormal results are displayed) Labs Reviewed - No data to display  EKG  EKG sinus bradycardia, no acute signs of ischemia or infarction  Radiology No results found.  Procedures Procedures (including critical care time)  Medications Ordered in UC Medications - No  data to display  Initial Impression / Assessment and Plan / UC Course  I have reviewed the triage vital signs and the nursing notes.  Pertinent labs & imaging results that were available during my care of the patient were reviewed by me and considered in my medical decision making (see chart for details).     Discussed with patient recent surgery does put her at a high risk of developing a blood clot, has swelling, but no associated pain or warmth.  Discussed options with patient of being evaluated in the emergency room for ultrasound, patient requested outpatient imaging as she has previously had this done with her prior DVT which she had in her left leg/calf.  Blood pressure improved to 179/ 90, EKG reassuring, do not suspect ACS at this time.  Recommended for patient to take blood pressure medicine after leaving here, continue to monitor blood pressure at home, if she has developed changing or worsening chest pain to follow-up in emergency room.  We will set up ultrasound to rule out DVT.  Swelling may be residual from prior gout.  Discussed strict return precautions. Patient verbalized understanding and is agreeable with plan.  Final Clinical Impressions(s) / UC Diagnoses   Final diagnoses:  Right ankle swelling     Discharge Instructions     Please call [phone] in the morning to schedule ultrasound Elevate foot Use ACE wrap for compression   Follow up in emergency room if symptoms worsening, developing chest pain  Your blood pressure was elevated today in clinic. Please be sure to take blood pressure medications as prescribed. Please monitor your blood pressure at home or when you go to a [facility]/Gym. Please follow up with your primary care doctor to recheck blood pressure and discuss any need for medication changes.   Please go to Emergency Room if you start to experience severe  headache, vision changes, decreased urine production, chest pain, shortness of breath, speech  slurring, one sided weakness.   ED Prescriptions    None     PDMP not reviewed this encounter.   Janith Lima, [place] 08/21/19 [phone]

## 2019-08-21 NOTE — Telephone Encounter (Signed)
Switching order for [facility]

## 2019-08-30 DIAGNOSIS — M109 Gout, unspecified: Secondary | ICD-10-CM | POA: Diagnosis not present

## 2019-08-30 DIAGNOSIS — R600 Localized edema: Secondary | ICD-10-CM | POA: Diagnosis not present

## 2019-08-30 DIAGNOSIS — I10 Essential (primary) hypertension: Secondary | ICD-10-CM | POA: Diagnosis not present

## 2019-08-30 DIAGNOSIS — M79604 Pain in right leg: Secondary | ICD-10-CM | POA: Diagnosis not present

## 2019-08-30 DIAGNOSIS — Z6821 Body mass index (BMI) 21.0-21.9, adult: Secondary | ICD-10-CM | POA: Diagnosis not present

## 2019-09-05 ENCOUNTER — Telehealth: Payer: Self-pay

## 2019-09-05 DIAGNOSIS — E063 Autoimmune thyroiditis: Secondary | ICD-10-CM | POA: Diagnosis not present

## 2019-09-05 DIAGNOSIS — M1991 Primary osteoarthritis, unspecified site: Secondary | ICD-10-CM | POA: Diagnosis not present

## 2019-09-05 DIAGNOSIS — N183 Chronic kidney disease, stage 3 unspecified: Secondary | ICD-10-CM | POA: Diagnosis not present

## 2019-09-05 DIAGNOSIS — I129 Hypertensive chronic kidney disease with stage 1 through stage 4 chronic kidney disease, or unspecified chronic kidney disease: Secondary | ICD-10-CM | POA: Diagnosis not present

## 2019-09-05 NOTE — Telephone Encounter (Signed)
Pt states that she took her BP on last night and it was 189/98. She rechecked an hour after taking a Bystolic 2.5mg  and it was 192/97. Pt states she took BP this am and it was 154/82 before she took any medication. She did take a Bystolic 2.5 mg at 123456 am. Current BP is 186/90. Pt denies SOB, CP and states that she can tell that her BP is up. Please advise.

## 2019-09-05 NOTE — Telephone Encounter (Signed)
She might consider trying to take two of the Bystolic tablets for a total of 5 mg in 1 day and continue to track blood pressure.  That is still a relatively low dose.  Have her keep a recording of blood pressure daily and schedule an office visit in the next 1 to 2 weeks with [place] to see if further adjustments need to be made.

## 2019-09-05 NOTE — Telephone Encounter (Signed)
Pt concerned with high BP readings  Please call [phone]  Thanks renee

## 2019-09-06 NOTE — Telephone Encounter (Signed)
Called patient. No answer. Left message to call back.  

## 2019-09-10 NOTE — Telephone Encounter (Signed)
Spoke with pt who states that BP has returned to normal after completing Prednisone. Pt states she will monitor BP for a week and then call if BP starts to elevates again.

## 2019-10-05 DIAGNOSIS — M1991 Primary osteoarthritis, unspecified site: Secondary | ICD-10-CM | POA: Diagnosis not present

## 2019-10-05 DIAGNOSIS — E063 Autoimmune thyroiditis: Secondary | ICD-10-CM | POA: Diagnosis not present

## 2019-10-05 DIAGNOSIS — N183 Chronic kidney disease, stage 3 unspecified: Secondary | ICD-10-CM | POA: Diagnosis not present

## 2019-10-05 DIAGNOSIS — I129 Hypertensive chronic kidney disease with stage 1 through stage 4 chronic kidney disease, or unspecified chronic kidney disease: Secondary | ICD-10-CM | POA: Diagnosis not present

## 2019-10-13 ENCOUNTER — Emergency Department (HOSPITAL_COMMUNITY)
Admission: EM | Admit: 2019-10-13 | Discharge: 2019-10-13 | Disposition: A | Discharge status: Home / Self Care | Payer: PPO | Attending: Emergency Medicine | Admitting: Emergency Medicine

## 2019-10-13 ENCOUNTER — Encounter (HOSPITAL_COMMUNITY): Payer: Self-pay | Admitting: Emergency Medicine

## 2019-10-13 ENCOUNTER — Other Ambulatory Visit: Payer: Self-pay

## 2019-10-13 ENCOUNTER — Emergency Department (HOSPITAL_COMMUNITY): Payer: PPO

## 2019-10-13 DIAGNOSIS — Z7902 Long term (current) use of antithrombotics/antiplatelets: Secondary | ICD-10-CM | POA: Diagnosis not present

## 2019-10-13 DIAGNOSIS — S0003XA Contusion of scalp, initial encounter: Secondary | ICD-10-CM | POA: Diagnosis not present

## 2019-10-13 DIAGNOSIS — Z8673 Personal history of transient ischemic attack (TIA), and cerebral infarction without residual deficits: Secondary | ICD-10-CM | POA: Diagnosis not present

## 2019-10-13 DIAGNOSIS — I129 Hypertensive chronic kidney disease with stage 1 through stage 4 chronic kidney disease, or unspecified chronic kidney disease: Secondary | ICD-10-CM | POA: Insufficient documentation

## 2019-10-13 DIAGNOSIS — Y939 Activity, unspecified: Secondary | ICD-10-CM | POA: Insufficient documentation

## 2019-10-13 DIAGNOSIS — Y929 Unspecified place or not applicable: Secondary | ICD-10-CM | POA: Diagnosis not present

## 2019-10-13 DIAGNOSIS — Z7982 Long term (current) use of aspirin: Secondary | ICD-10-CM | POA: Insufficient documentation

## 2019-10-13 DIAGNOSIS — Y999 Unspecified external cause status: Secondary | ICD-10-CM | POA: Diagnosis not present

## 2019-10-13 DIAGNOSIS — Z79899 Other long term (current) drug therapy: Secondary | ICD-10-CM | POA: Insufficient documentation

## 2019-10-13 DIAGNOSIS — I2510 Atherosclerotic heart disease of native coronary artery without angina pectoris: Secondary | ICD-10-CM | POA: Diagnosis not present

## 2019-10-13 DIAGNOSIS — R519 Headache, unspecified: Secondary | ICD-10-CM | POA: Diagnosis not present

## 2019-10-13 DIAGNOSIS — W01198A Fall on same level from slipping, tripping and stumbling with subsequent striking against other object, initial encounter: Secondary | ICD-10-CM | POA: Diagnosis not present

## 2019-10-13 DIAGNOSIS — N183 Chronic kidney disease, stage 3 unspecified: Secondary | ICD-10-CM | POA: Diagnosis not present

## 2019-10-13 DIAGNOSIS — S0990XA Unspecified injury of head, initial encounter: Secondary | ICD-10-CM | POA: Insufficient documentation

## 2019-10-13 DIAGNOSIS — S199XXA Unspecified injury of neck, initial encounter: Secondary | ICD-10-CM | POA: Diagnosis not present

## 2019-10-13 DIAGNOSIS — W19XXXA Unspecified fall, initial encounter: Secondary | ICD-10-CM

## 2019-10-13 NOTE — ED Provider Notes (Signed)
[facility] EMERGENCY DEPARTMENT Provider Note   CSN: [id number] Arrival date & time: 10/13/19  2035     History Chief Complaint  Patient presents with  . Head Injury    Allison Morris is a 84 y.o. female.  Patient had a mechanical fall getting into the car this evening and fell back and struck her head on the ground.  No loss of consciousness.  Has been ambulatory since then.  Complaining of some posterior head pain and bleeding.  No neck pain no numbness no weakness.  She is on clopidogrel and aspirin.  The history is provided by the patient and the spouse.  Head Injury Location:  Occipital Time since incident:  1 hour Mechanism of injury: fall   Fall:    Fall occurred:  Standing   Impact surface:  Product manager of impact:  Head   Entrapped after fall: no   Pain details:    Quality:  Throbbing   Severity:  Mild   Timing:  Constant   Progression:  Unchanged Chronicity:  New Relieved by:  None tried Worsened by:  Nothing Ineffective treatments:  None tried Associated symptoms: headache   Associated symptoms: no blurred vision, no disorientation, no double vision, no focal weakness, no loss of consciousness, no memory loss, no nausea, no neck pain and no vomiting   Risk factors: aspirin and being elderly        Past Medical History:  Diagnosis Date  . Aortic dilatation (HCC)    a. echo 08/2017 showed EF 65-70%, grade 1 DD, mild AI, mildly dilated ascending aorta (75mm), mild MR, mild TR.   Marland Kitchen Aortic regurgitation   . Arthritis    hands  . CKD (chronic kidney disease), stage III   . Essential hypertension   . GERD (gastroesophageal reflux disease)   . History of DVT (deep vein thrombosis)    Left leg 1997   . Hyperlipemia   . Mild aortic insufficiency   . Mild mitral regurgitation   . Mild tricuspid insufficiency   . Seasonal allergies     Patient Active Problem List   Diagnosis Date Noted  . Orthostatic lightheadedness 12/11/2017  . TIA (transient  ischemic attack) 11/14/2015  . Hypertension   . Hyperlipemia   . History of DVT (deep vein thrombosis)   . Aortic dilatation [facility])     Past Surgical History:  Procedure Laterality Date  . APPENDECTOMY    . CARPAL TUNNEL RELEASE    . MASS EXCISION Right 08/07/2019   Procedure: EXCISION CYST RIGHT INDEX AND RIGHT MIDDLE FINGERS WITH DEBRIDEMENT OF METACARPALPHALANGEAL JOINTS;  Surgeon: Daryll Brod, MD;  Location: [place];  Service: Orthopedics;  Laterality: Right;  . TONSILLECTOMY       OB History   No obstetric history on file.     Family History  Problem Relation Age of Onset  . Aneurysm Sister   . Valvular heart disease Sister   . Aneurysm Paternal Grandfather   . Heart attack Paternal Grandfather     Social History   Tobacco Use  . Smoking status: Never Smoker  . Smokeless tobacco: Never Used  Substance Use Topics  . Alcohol use: No    Alcohol/week: 0.0 standard drinks  . Drug use: No    Home Medications Prior to Admission medications   Medication Sig Start Date End Date Taking? Authorizing Provider  acetaminophen (TYLENOL) 500 MG tablet Take 500 mg by mouth every 6 (six) hours as needed for  mild pain or moderate pain.     [provider]  aspirin EC 81 MG EC tablet Take 1 tablet (81 mg total) by mouth daily. 11/15/15   Orvan Falconer, MD  Cholecalciferol (VITAMIN D3) 1000 units CAPS Take 1 capsule by mouth daily.     [provider]  clobetasol cream (TEMOVATE) AB-[id number] % Apply 1 application topically daily as needed (for irritation).     [provider]  clopidogrel (PLAVIX) 75 MG tablet Take 1 tablet (75 mg total) by mouth daily. 11/15/15   Orvan Falconer, MD  colchicine 0.6 MG tablet Take 0.6 mg by mouth. 1/2 tablet daily    [provider]  fexofenadine (ALLEGRA) 180 MG tablet Take 180 mg by mouth daily.    [provider]  fluticasone (FLONASE) 50 MCG/ACT nasal spray Place 2 sprays into both nostrils daily.      [provider]  Glucosamine-Chondroit-Vit C-Mn (GLUCOSAMINE 1500 COMPLEX PO) Take 1 tablet by mouth daily.     [provider]  nebivolol (BYSTOLIC) 2.5 MG tablet Take 1 tablet (2.5 mg total) by mouth daily. Take at dinner with food. 05/07/19   Strader, Fransisco Hertz, PA-C  Omega-3 Fatty Acids (FISH OIL) 1200 MG CAPS Take 1 capsule by mouth 2 (two) times daily.     [provider]  pantoprazole (PROTONIX) 40 MG tablet Take 40 mg by mouth daily.     [provider]  traMADol (ULTRAM) 50 MG tablet Take 1 tablet (50 mg total) by mouth every 6 (six) hours as needed. 08/07/19   Daryll Brod, MD    Allergies    Amlodipine, Biaxin [clarithromycin], Doxycycline, Estrogens, Felodipine, Labetalol, Lisinopril, Losartan, Macrodantin [nitrofurantoin], Statins, Ceftin [cefuroxime], Chlorthalidone, Ciprofloxacin, and Sulfa antibiotics  Review of Systems   Review of Systems  Constitutional: Negative for fever.  HENT: Negative for sore throat.   Eyes: Negative for blurred vision, double vision and visual disturbance.  Respiratory: Negative for shortness of breath.   Cardiovascular: Negative for chest pain.  Gastrointestinal: Negative for abdominal pain, nausea and vomiting.  Genitourinary: Negative for dysuria.  Musculoskeletal: Negative for neck pain.  Skin: Positive for wound. Negative for rash.  Neurological: Positive for headaches. Negative for focal weakness and loss of consciousness.  Psychiatric/Behavioral: Negative for memory loss.    Physical Exam Updated Vital Signs BP (!) 191/86 (BP Location: Left Arm)   Pulse 85   Temp 98.2 F (36.8 C) (Oral)   Resp 20   Ht 5\' 6"  (1.676 m)   Wt 55.8 kg   SpO2 99%   BMI 19.85 kg/m   Physical Exam Vitals and nursing note reviewed.  Constitutional:      General: She is not in acute distress.    Appearance: Normal appearance. She is well-developed.  HENT:     Head: Normocephalic.     Comments: She has an occipital  hematoma approximately 4 cm with a little bit of abrasion.  No active bleeding. Eyes:     Conjunctiva/sclera: Conjunctivae normal.  Cardiovascular:     Rate and Rhythm: Normal rate and regular rhythm.     Heart sounds: No murmur.  Pulmonary:     Effort: Pulmonary effort is normal. No respiratory distress.     Breath sounds: Normal breath sounds.  Abdominal:     Palpations: Abdomen is soft.     Tenderness: There is no abdominal tenderness.  Musculoskeletal:        General: No deformity or signs of injury. Normal range of  motion.     Cervical back: Neck supple. No tenderness.  Skin:    General: Skin is warm and dry.     Capillary Refill: Capillary refill takes less than 2 seconds.  Neurological:     General: No focal deficit present.     Mental Status: She is alert. Mental status is at baseline.     Sensory: No sensory deficit.     Motor: No weakness.     ED Results / Procedures / Treatments   Labs (all labs ordered are listed, but only abnormal results are displayed) Labs Reviewed - No data to display  EKG None  Radiology CT Head Wo Contrast  Result Date: 10/13/2019 CLINICAL DATA:  Acute pain due to trauma. Pain to the back of the head. EXAM: CT HEAD WITHOUT CONTRAST CT CERVICAL SPINE WITHOUT CONTRAST TECHNIQUE: Multidetector CT imaging of the head and cervical spine was performed following the standard protocol without intravenous contrast. Multiplanar CT image reconstructions of the cervical spine were also generated. COMPARISON:  MRI brain dated November 14, 2015. CT head dated November 14, 2015. FINDINGS: CT HEAD FINDINGS Brain: No evidence of acute infarction, hemorrhage, hydrocephalus, extra-axial collection or mass lesion/mass effect. Mild atrophy and chronic microvascular ischemic changes are noted. Vascular: No hyperdense vessel or unexpected calcification. Skull: There is left posterior scalp swelling without evidence for a calvarial fracture. Sinuses/Orbits: No acute finding.  Other: None. CT CERVICAL SPINE FINDINGS Alignment: There is a 4 mm anterolisthesis C4 on C5. This appears to be degenerative in etiology. Skull base and vertebrae: No acute fracture. No primary bone lesion or focal pathologic process. Soft tissues and spinal canal: No prevertebral fluid or swelling. No visible canal hematoma. Disc levels: Multilevel degenerative changes are noted throughout the cervical spine, greatest at the C5-C6 and C6-C7 levels. Upper chest: Negative. Other: None IMPRESSION: 1. Left posterior scalp swelling without evidence for a calvarial fracture or acute intracranial abnormality. 2. No evidence for acute fracture of the cervical spine. 3. There is a 4 mm anterolisthesis C4 on C5. This appears to be degenerative in etiology. Electronically Signed   By: Constance Holster M.D.   On: 10/13/2019 21:33   CT Cervical Spine Wo Contrast  Result Date: 10/13/2019 CLINICAL DATA:  Acute pain due to trauma. Pain to the back of the head. EXAM: CT HEAD WITHOUT CONTRAST CT CERVICAL SPINE WITHOUT CONTRAST TECHNIQUE: Multidetector CT imaging of the head and cervical spine was performed following the standard protocol without intravenous contrast. Multiplanar CT image reconstructions of the cervical spine were also generated. COMPARISON:  MRI brain dated November 14, 2015. CT head dated November 14, 2015. FINDINGS: CT HEAD FINDINGS Brain: No evidence of acute infarction, hemorrhage, hydrocephalus, extra-axial collection or mass lesion/mass effect. Mild atrophy and chronic microvascular ischemic changes are noted. Vascular: No hyperdense vessel or unexpected calcification. Skull: There is left posterior scalp swelling without evidence for a calvarial fracture. Sinuses/Orbits: No acute finding. Other: None. CT CERVICAL SPINE FINDINGS Alignment: There is a 4 mm anterolisthesis C4 on C5. This appears to be degenerative in etiology. Skull base and vertebrae: No acute fracture. No primary bone lesion or focal pathologic  process. Soft tissues and spinal canal: No prevertebral fluid or swelling. No visible canal hematoma. Disc levels: Multilevel degenerative changes are noted throughout the cervical spine, greatest at the C5-C6 and C6-C7 levels. Upper chest: Negative. Other: None IMPRESSION: 1. Left posterior scalp swelling without evidence for a calvarial fracture or acute intracranial abnormality. 2. No evidence for acute  fracture of the cervical spine. 3. There is a 4 mm anterolisthesis C4 on C5. This appears to be degenerative in etiology. Electronically Signed   By: Constance Holster M.D.   On: 10/13/2019 21:33    Procedures Procedures (including critical care time)  Medications Ordered in ED Medications - No data to display  ED Course  I have reviewed the triage vital signs and the nursing notes.  Pertinent labs & imaging results that were available during my care of the patient were reviewed by me and considered in my medical decision making (see chart for details).    MDM Rules/Calculators/A&P                     Differential diagnosis includes skull fracture, intracranial bleed, hematoma, cervical fracture.  CT of head and cervical spine interpreted by me as no fractures no acute bleed.  I reassessed her wound and it still is more abraded than anything although it is oozing.  Patient has been up and ambulatory.  Return instructions discussed with her and her husband.  Final Clinical Impression(s) / ED Diagnoses Final diagnoses:  Fall, initial encounter  Contusion of scalp, initial encounter  Closed head injury, initial encounter    Rx / DC Orders ED Discharge Orders    None       Hayden Rasmussen, MD 10/14/19 [phone]

## 2019-10-13 NOTE — Discharge Instructions (Addendum)
You were seen in the emergency department for evaluation of injuries from a fall.  You had a scalp hematoma or bruise with an abrasion that is not actively bleeding.  Your CAT scan did not show any bleeding or fracture in the brain or your cervical spine.  You can use Tylenol as needed for pain.  Ice.  Follow-up with your doctor and return to the emergency department if any worsening or concerning symptoms

## 2019-10-13 NOTE — ED Notes (Signed)
Pt ambulatory to waiting room. Pt verbalized understanding of discharge instructions.   

## 2019-10-13 NOTE — ED Triage Notes (Signed)
Patient states she fell while attempting to get in the car tonight and fell back hitting the back of her head. Bleeding controlled at triage. States she is on plavix and baby aspirin daily.

## 2019-10-18 DIAGNOSIS — S0003XD Contusion of scalp, subsequent encounter: Secondary | ICD-10-CM | POA: Diagnosis not present

## 2019-10-18 DIAGNOSIS — E063 Autoimmune thyroiditis: Secondary | ICD-10-CM | POA: Diagnosis not present

## 2019-10-18 DIAGNOSIS — I77819 Aortic ectasia, unspecified site: Secondary | ICD-10-CM | POA: Diagnosis not present

## 2019-10-18 DIAGNOSIS — I10 Essential (primary) hypertension: Secondary | ICD-10-CM | POA: Diagnosis not present

## 2019-10-18 DIAGNOSIS — I7300 Raynaud's syndrome without gangrene: Secondary | ICD-10-CM | POA: Diagnosis not present

## 2019-10-18 DIAGNOSIS — Z6821 Body mass index (BMI) 21.0-21.9, adult: Secondary | ICD-10-CM | POA: Diagnosis not present

## 2019-10-18 DIAGNOSIS — I6523 Occlusion and stenosis of bilateral carotid arteries: Secondary | ICD-10-CM | POA: Diagnosis not present

## 2019-10-29 ENCOUNTER — Telehealth: Payer: Self-pay

## 2019-10-29 MED ORDER — NEBIVOLOL HCL 5 MG PO TABS: 5.0000 mg | ORAL_TABLET | Freq: Every day | ORAL | 3 refills | Status: DC

## 2019-10-29 NOTE — Telephone Encounter (Signed)
Please go ahead and refill the Bystolic at 5 mg daily.  She can continue to track her blood pressure.

## 2019-10-29 NOTE — Telephone Encounter (Signed)
Pt called to report that this oast Saturday evening she was getting ready ton go to bed and she checked her BP and it was 222/102 and HR 72... she says that she was asymptomatic.. she denies headache, dizziness, chest pain.   She says that she never checks her BP regularly. She had just a few of the Bystolic 2.5 mg left so she has been on and off taking 1/2 of the 10 mg that she had on hand. It was decreased at her video visit 06/2019.   She says she had another reading of 147/70 yesterday after taking the Bystolic 5mg  ( 1/2 of the 10 mg).  She is needing a refill on the Bystolic 2.5mg  but she os asking under the circumstances should she get a new RX for the 5mg .   I have asked her to check her BP more regularly and to write down her readings after she has been sitting for about 15-20 min.   To watch her NA intake.   I will forward to Dr. Domenic Polite for review since she is almost out of her Rx and needs one sent in today pr tomorrow.

## 2019-10-29 NOTE — Telephone Encounter (Signed)
Pt advised and will keep track of her BP and let us know if she has any further problems.

## 2019-10-29 NOTE — Telephone Encounter (Signed)
Pt states she would like to speak with a Nurse Re: BP changes  Please call [phone]  Thanks renee

## 2019-11-23 DIAGNOSIS — M109 Gout, unspecified: Secondary | ICD-10-CM | POA: Diagnosis not present

## 2019-11-23 DIAGNOSIS — Z6822 Body mass index (BMI) 22.0-22.9, adult: Secondary | ICD-10-CM | POA: Diagnosis not present

## 2019-12-05 DIAGNOSIS — E063 Autoimmune thyroiditis: Secondary | ICD-10-CM | POA: Diagnosis not present

## 2019-12-05 DIAGNOSIS — N183 Chronic kidney disease, stage 3 unspecified: Secondary | ICD-10-CM | POA: Diagnosis not present

## 2019-12-05 DIAGNOSIS — M1991 Primary osteoarthritis, unspecified site: Secondary | ICD-10-CM | POA: Diagnosis not present

## 2019-12-05 DIAGNOSIS — I129 Hypertensive chronic kidney disease with stage 1 through stage 4 chronic kidney disease, or unspecified chronic kidney disease: Secondary | ICD-10-CM | POA: Diagnosis not present

## 2019-12-07 ENCOUNTER — Ambulatory Visit: Payer: PPO | Admitting: Cardiology

## 2019-12-07 ENCOUNTER — Encounter: Payer: Self-pay | Admitting: Cardiology

## 2019-12-07 VITALS — BP 152/80 | HR 68 | Ht 66.0 in | Wt 124.0 lb

## 2019-12-07 DIAGNOSIS — I10 Essential (primary) hypertension: Secondary | ICD-10-CM | POA: Diagnosis not present

## 2019-12-07 NOTE — Patient Instructions (Signed)
Medication Instructions:  °Your physician recommends that you continue on your current medications as directed. Please refer to the Current Medication list given to you today. ° °*If you need a refill on your cardiac medications before your next appointment, please call your pharmacy* ° ° °Lab Work: °None today °If you have labs (blood work) drawn today and your tests are completely normal, you will receive your results only by: °• MyChart Message (if you have MyChart) OR °• A paper copy in the mail °If you have any lab test that is abnormal or we need to change your treatment, we will call you to review the results. ° ° °Testing/Procedures: °None today ° ° °Follow-Up: °At [facility], you and your health needs are our priority.  As part of our continuing mission to provide you with exceptional heart care, we have created designated Provider Care Teams.  These Care Teams include your primary Cardiologist (physician) and Advanced Practice Providers (APPs -  Physician Assistants and Nurse Practitioners) who all work together to provide you with the care you need, when you need it. ° °We recommend signing up for the patient portal called "MyChart".  Sign up information is provided on this After Visit Summary.  MyChart is used to connect with patients for Virtual Visits (Telemedicine).  Patients are able to view lab/test results, encounter notes, upcoming appointments, etc.  Non-urgent messages can be sent to your provider as well.   °To learn more about what you can do with MyChart, go to [email or web address].   ° °Your next appointment:   °6 month(s) ° °The format for your next appointment:   °In Person ° °Provider:   °Samuel McDowell, MD ° ° °Other Instructions °None ° ° ° ° °Thank you for choosing [facility] ! ° ° ° ° ° ° ° ° °

## 2019-12-07 NOTE — Progress Notes (Signed)
Cardiology Office Note  Date: 12/07/2019   ID: Allison Morris, DOB [birth year], MRN [id number]  PCP:  Redmond School, MD  Cardiologist:  Rozann Lesches, MD Electrophysiologist:  None   Chief Complaint  Patient presents with  . Cardiac follow-up    History of Present Illness: Allison Morris is an 84 y.o. female last assessed via telehealth encounter in January.  She presents for a follow-up visit.  We went over her home blood pressure and heart rate measurements.  On balance her blood pressures have been good, occasionally has systolic up into the 782N.  She has not taken her Bystolic daily because she tends to feel worse when her systolic is down in the 1 10-1 20 range.  She has had numerous medication intolerances including Norvasc, Plendil, ACE inhibitor's and ARB's, chlorthalidone, spironolactone, and beta-blockers with the exception of Bystolic.  Past Medical History:  Diagnosis Date  . Aortic dilatation (HCC)    a. echo 08/2017 showed EF 65-70%, grade 1 DD, mild AI, mildly dilated ascending aorta (22mm), mild MR, mild TR.   Marland Kitchen Aortic regurgitation   . Arthritis    hands  . CKD (chronic kidney disease), stage III   . Essential hypertension   . GERD (gastroesophageal reflux disease)   . History of DVT (deep vein thrombosis)    Left leg 1997   . Hyperlipemia   . Mild aortic insufficiency   . Mild mitral regurgitation   . Mild tricuspid insufficiency   . Seasonal allergies     Past Surgical History:  Procedure Laterality Date  . APPENDECTOMY    . CARPAL TUNNEL RELEASE    . MASS EXCISION Right 08/07/2019   Procedure: EXCISION CYST RIGHT INDEX AND RIGHT MIDDLE FINGERS WITH DEBRIDEMENT OF METACARPALPHALANGEAL JOINTS;  Surgeon: Daryll Brod, MD;  Location: [place];  Service: Orthopedics;  Laterality: Right;  . TONSILLECTOMY      Current Outpatient Medications  Medication Sig Dispense Refill  . acetaminophen (TYLENOL) 500 MG tablet Take 500 mg by mouth  every 6 (six) hours as needed for mild pain or moderate pain.     Marland Kitchen aspirin EC 81 MG EC tablet Take 1 tablet (81 mg total) by mouth daily. 100 tablet 1  . Cholecalciferol (VITAMIN D3) 1000 units CAPS Take 1 capsule by mouth daily.     . clobetasol cream (TEMOVATE) 5.62 % Apply 1 application topically daily as needed (for irritation).     . clopidogrel (PLAVIX) 75 MG tablet Take 1 tablet (75 mg total) by mouth daily. 30 tablet 2  . colchicine 0.6 MG tablet Take 0.6 mg by mouth. 1/2 tablet daily    . fexofenadine (ALLEGRA) 180 MG tablet Take 180 mg by mouth daily.    . fluticasone (FLONASE) 50 MCG/ACT nasal spray Place 2 sprays into both nostrils daily.     . Glucosamine-Chondroit-Vit C-Mn (GLUCOSAMINE 1500 COMPLEX PO) Take 1 tablet by mouth daily.     . nebivolol (BYSTOLIC) 5 MG tablet Take 1 tablet (5 mg total) by mouth daily. 90 tablet 3  . Omega-3 Fatty Acids (FISH OIL) 1200 MG CAPS Take 1 capsule by mouth 2 (two) times daily.     . pantoprazole (PROTONIX) 40 MG tablet Take 40 mg by mouth daily.     . traMADol (ULTRAM) 50 MG tablet Take 1 tablet (50 mg total) by mouth every 6 (six) hours as needed. 20 tablet 0   No current facility-administered medications for this visit.   Allergies:  Amlodipine, Biaxin [clarithromycin], Doxycycline, Estrogens, Felodipine, Labetalol, Lisinopril, Losartan, Macrodantin [nitrofurantoin], Statins, Ceftin [cefuroxime], Chlorthalidone, Ciprofloxacin, and Sulfa antibiotics   ROS:   Gout flare.  Physical Exam: VS:  BP (!) 152/80   Pulse 68   Ht 5\' 6"  (1.676 m)   Wt 124 lb (56.2 kg)   SpO2 98%   BMI 20.01 kg/m , BMI Body mass index is 20.01 kg/m.  Wt Readings from Last 3 Encounters:  12/07/19 124 lb (56.2 kg)  10/13/19 123 lb (55.8 kg)  08/07/19 121 lb 14.6 oz (55.3 kg)    General: Elderly woman, appears comfortable at rest. HEENT: Conjunctiva and lids normal, wearing a mask. Neck: Supple, no elevated JVP or carotid bruits, no thyromegaly. Lungs:  Clear to auscultation, nonlabored breathing at rest. Cardiac: Regular rate and rhythm, no S3 or significant systolic murmur, no pericardial rub. Extremities: No pitting edema, distal pulses 2+.  ECG:  An ECG dated 08/02/2019 was personally reviewed today and demonstrated:  Sinus bradycardia with PAC, nonspecific T wave changes.  Recent Labwork: 08/02/2019: BUN 31; Creatinine, Ser 1.39; Potassium 4.5; Sodium 137     Component Value Date/Time   CHOL 248 (H) 11/15/2015 0542   TRIG 87 11/15/2015 0542   HDL 48 11/15/2015 0542   CHOLHDL 5.2 11/15/2015 0542   VLDL 17 11/15/2015 0542   LDLCALC 183 (H) 11/15/2015 0542    Other Studies Reviewed Today:  Echocardiogram 11/09/2018: 1. The left ventricle has hyperdynamic systolic function, with an ejection fraction of >65%. The cavity size was normal. Left ventricular diastolic Doppler parameters are consistent with impaired relaxation. No evidence of left ventricular regional wall motion abnormalities. 2. The right ventricle has normal systolic function. The cavity was normal. There is no increase in right ventricular wall thickness. 3. The aortic valve is tricuspid. Mild calcification of the aortic valve. Aortic valve regurgitation is mild by color flow Doppler. Mild aortic annular calcification noted. 4. The mitral valve is grossly normal. There is mild mitral annular calcification present. 5. The tricuspid valve is grossly normal. 6. The aortic root is normal in size and structure. 7. There is moderate dilatation of the ascending aorta measuring 44 mm.  Lexiscan Myoview 12/29/2017:  No diagnostic ST segment changes to indicate ischemia.  Small, mild intensity, apical anterior defect that is fixed and consistent with breast attenuation. No significant ischemic defects noted.  This is a low risk study.  Nuclear stress EF: 81%.  Assessment and Plan:  Essential hypertension.  She has cut her Bystolic back to 2.5 mg daily, there are  times when she skips doses when his systolics are in the 675Q to 130s (states that she feels weak when her systolic is 492-010).  We discussed salt restriction, walking for exercise.  She has numerous other medication intolerances as discussed above.  Medication Adjustments/Labs and Tests Ordered: Current medicines are reviewed at length with the patient today.  Concerns regarding medicines are outlined above.   Tests Ordered: No orders of the defined types were placed in this encounter.   Medication Changes: No orders of the defined types were placed in this encounter.   Disposition:  Follow up 6 months in the [place] office.  Signed, Satira Sark, MD, [facility] 12/07/2019 1:07 PM    [place] [facility] [address], [place], [place] [address] Phone: [phone]; Fax: [phone]

## 2019-12-21 DIAGNOSIS — R1013 Epigastric pain: Secondary | ICD-10-CM | POA: Diagnosis not present

## 2019-12-21 DIAGNOSIS — Z6821 Body mass index (BMI) 21.0-21.9, adult: Secondary | ICD-10-CM | POA: Diagnosis not present

## 2019-12-28 DIAGNOSIS — Z6821 Body mass index (BMI) 21.0-21.9, adult: Secondary | ICD-10-CM | POA: Diagnosis not present

## 2019-12-28 DIAGNOSIS — J069 Acute upper respiratory infection, unspecified: Secondary | ICD-10-CM | POA: Diagnosis not present

## 2019-12-28 DIAGNOSIS — M10041 Idiopathic gout, right hand: Secondary | ICD-10-CM | POA: Diagnosis not present

## 2019-12-31 ENCOUNTER — Telehealth: Payer: Self-pay

## 2019-12-31 NOTE — Telephone Encounter (Signed)
Patient called to say her blood pressure has been running elevated lately.She states she has only been taking Nebivolol 2.5 mg instead of her instructed dose of 5 mg.I encouraged her to take her prescribed dose of 5 mg daily.   Recent readings: 170/87, 165/94, 206/94, 218/102  Reports HR today 53 but it usually runs in the 60-70's

## 2019-12-31 NOTE — Telephone Encounter (Signed)
Thank you.  That is much higher than usual.  She has a number of medication intolerances which makes blood pressure control challenging.  Would encourage regular use of nebivolol as you have.

## 2020-01-01 NOTE — Telephone Encounter (Signed)
I spoke with patient.She agrees to take nebivolol 5 mg daily and monitor her blood pressures

## 2020-01-04 DIAGNOSIS — N1832 Chronic kidney disease, stage 3b: Secondary | ICD-10-CM | POA: Diagnosis not present

## 2020-01-04 DIAGNOSIS — I129 Hypertensive chronic kidney disease with stage 1 through stage 4 chronic kidney disease, or unspecified chronic kidney disease: Secondary | ICD-10-CM | POA: Diagnosis not present

## 2020-01-04 DIAGNOSIS — M1991 Primary osteoarthritis, unspecified site: Secondary | ICD-10-CM | POA: Diagnosis not present

## 2020-01-04 DIAGNOSIS — E063 Autoimmune thyroiditis: Secondary | ICD-10-CM | POA: Diagnosis not present

## 2020-01-10 DIAGNOSIS — I10 Essential (primary) hypertension: Secondary | ICD-10-CM | POA: Diagnosis not present

## 2020-01-10 DIAGNOSIS — Z6821 Body mass index (BMI) 21.0-21.9, adult: Secondary | ICD-10-CM | POA: Diagnosis not present

## 2020-01-15 DIAGNOSIS — L400 Psoriasis vulgaris: Secondary | ICD-10-CM | POA: Diagnosis not present

## 2020-02-05 DIAGNOSIS — E063 Autoimmune thyroiditis: Secondary | ICD-10-CM | POA: Diagnosis not present

## 2020-02-05 DIAGNOSIS — I129 Hypertensive chronic kidney disease with stage 1 through stage 4 chronic kidney disease, or unspecified chronic kidney disease: Secondary | ICD-10-CM | POA: Diagnosis not present

## 2020-02-05 DIAGNOSIS — M1991 Primary osteoarthritis, unspecified site: Secondary | ICD-10-CM | POA: Diagnosis not present

## 2020-02-05 DIAGNOSIS — N1832 Chronic kidney disease, stage 3b: Secondary | ICD-10-CM | POA: Diagnosis not present

## 2020-02-19 DIAGNOSIS — C4441 Basal cell carcinoma of skin of scalp and neck: Secondary | ICD-10-CM | POA: Diagnosis not present

## 2020-02-19 DIAGNOSIS — N1832 Chronic kidney disease, stage 3b: Secondary | ICD-10-CM | POA: Diagnosis not present

## 2020-02-19 DIAGNOSIS — L400 Psoriasis vulgaris: Secondary | ICD-10-CM | POA: Diagnosis not present

## 2020-02-22 DIAGNOSIS — I10 Essential (primary) hypertension: Secondary | ICD-10-CM | POA: Diagnosis not present

## 2020-02-22 DIAGNOSIS — M1991 Primary osteoarthritis, unspecified site: Secondary | ICD-10-CM | POA: Diagnosis not present

## 2020-02-22 DIAGNOSIS — I129 Hypertensive chronic kidney disease with stage 1 through stage 4 chronic kidney disease, or unspecified chronic kidney disease: Secondary | ICD-10-CM | POA: Diagnosis not present

## 2020-02-29 DIAGNOSIS — N1832 Chronic kidney disease, stage 3b: Secondary | ICD-10-CM | POA: Diagnosis not present

## 2020-02-29 DIAGNOSIS — I129 Hypertensive chronic kidney disease with stage 1 through stage 4 chronic kidney disease, or unspecified chronic kidney disease: Secondary | ICD-10-CM | POA: Diagnosis not present

## 2020-02-29 DIAGNOSIS — N2581 Secondary hyperparathyroidism of renal origin: Secondary | ICD-10-CM | POA: Diagnosis not present

## 2020-02-29 DIAGNOSIS — D631 Anemia in chronic kidney disease: Secondary | ICD-10-CM | POA: Diagnosis not present

## 2020-02-29 DIAGNOSIS — Z23 Encounter for immunization: Secondary | ICD-10-CM | POA: Diagnosis not present

## 2020-03-03 ENCOUNTER — Other Ambulatory Visit: Payer: Self-pay

## 2020-03-03 ENCOUNTER — Ambulatory Visit (HOSPITAL_COMMUNITY)
Admission: RE | Admit: 2020-03-03 | Discharge: 2020-03-03 | Disposition: A | Discharge status: Home / Self Care | Payer: PPO | Source: Ambulatory Visit | Attending: Internal Medicine | Admitting: Internal Medicine

## 2020-03-03 ENCOUNTER — Other Ambulatory Visit (HOSPITAL_COMMUNITY): Payer: Self-pay | Admitting: Internal Medicine

## 2020-03-03 DIAGNOSIS — M1991 Primary osteoarthritis, unspecified site: Secondary | ICD-10-CM | POA: Diagnosis not present

## 2020-03-03 DIAGNOSIS — K140 Glossitis: Secondary | ICD-10-CM | POA: Diagnosis not present

## 2020-03-03 DIAGNOSIS — M25571 Pain in right ankle and joints of right foot: Secondary | ICD-10-CM | POA: Diagnosis not present

## 2020-03-03 DIAGNOSIS — M7731 Calcaneal spur, right foot: Secondary | ICD-10-CM | POA: Diagnosis not present

## 2020-03-03 DIAGNOSIS — M25471 Effusion, right ankle: Secondary | ICD-10-CM | POA: Diagnosis not present

## 2020-03-03 DIAGNOSIS — Z6821 Body mass index (BMI) 21.0-21.9, adult: Secondary | ICD-10-CM | POA: Diagnosis not present

## 2020-03-03 DIAGNOSIS — I10 Essential (primary) hypertension: Secondary | ICD-10-CM | POA: Diagnosis not present

## 2020-03-03 DIAGNOSIS — M79671 Pain in right foot: Secondary | ICD-10-CM

## 2020-03-03 DIAGNOSIS — R1319 Other dysphagia: Secondary | ICD-10-CM | POA: Diagnosis not present

## 2020-03-03 DIAGNOSIS — L03115 Cellulitis of right lower limb: Secondary | ICD-10-CM | POA: Diagnosis not present

## 2020-03-03 DIAGNOSIS — B370 Candidal stomatitis: Secondary | ICD-10-CM | POA: Diagnosis not present

## 2020-03-03 DIAGNOSIS — M1990 Unspecified osteoarthritis, unspecified site: Secondary | ICD-10-CM | POA: Diagnosis not present

## 2020-03-03 DIAGNOSIS — M810 Age-related osteoporosis without current pathological fracture: Secondary | ICD-10-CM | POA: Diagnosis not present

## 2020-03-06 DIAGNOSIS — E063 Autoimmune thyroiditis: Secondary | ICD-10-CM | POA: Diagnosis not present

## 2020-03-06 DIAGNOSIS — I129 Hypertensive chronic kidney disease with stage 1 through stage 4 chronic kidney disease, or unspecified chronic kidney disease: Secondary | ICD-10-CM | POA: Diagnosis not present

## 2020-03-06 DIAGNOSIS — N183 Chronic kidney disease, stage 3 unspecified: Secondary | ICD-10-CM | POA: Diagnosis not present

## 2020-03-06 DIAGNOSIS — M1991 Primary osteoarthritis, unspecified site: Secondary | ICD-10-CM | POA: Diagnosis not present

## 2020-04-01 DIAGNOSIS — Z08 Encounter for follow-up examination after completed treatment for malignant neoplasm: Secondary | ICD-10-CM | POA: Diagnosis not present

## 2020-04-01 DIAGNOSIS — Z85828 Personal history of other malignant neoplasm of skin: Secondary | ICD-10-CM | POA: Diagnosis not present

## 2020-04-01 DIAGNOSIS — C44311 Basal cell carcinoma of skin of nose: Secondary | ICD-10-CM | POA: Diagnosis not present

## 2020-04-01 DIAGNOSIS — L570 Actinic keratosis: Secondary | ICD-10-CM | POA: Diagnosis not present

## 2020-04-01 DIAGNOSIS — L821 Other seborrheic keratosis: Secondary | ICD-10-CM | POA: Diagnosis not present

## 2020-04-01 DIAGNOSIS — X32XXXD Exposure to sunlight, subsequent encounter: Secondary | ICD-10-CM | POA: Diagnosis not present

## 2020-04-03 DIAGNOSIS — M1991 Primary osteoarthritis, unspecified site: Secondary | ICD-10-CM | POA: Diagnosis not present

## 2020-04-03 DIAGNOSIS — Z6821 Body mass index (BMI) 21.0-21.9, adult: Secondary | ICD-10-CM | POA: Diagnosis not present

## 2020-04-03 DIAGNOSIS — K219 Gastro-esophageal reflux disease without esophagitis: Secondary | ICD-10-CM | POA: Diagnosis not present

## 2020-04-03 DIAGNOSIS — I10 Essential (primary) hypertension: Secondary | ICD-10-CM | POA: Diagnosis not present

## 2020-04-03 DIAGNOSIS — M109 Gout, unspecified: Secondary | ICD-10-CM | POA: Diagnosis not present

## 2020-04-05 DIAGNOSIS — E063 Autoimmune thyroiditis: Secondary | ICD-10-CM | POA: Diagnosis not present

## 2020-04-05 DIAGNOSIS — N183 Chronic kidney disease, stage 3 unspecified: Secondary | ICD-10-CM | POA: Diagnosis not present

## 2020-04-05 DIAGNOSIS — M1991 Primary osteoarthritis, unspecified site: Secondary | ICD-10-CM | POA: Diagnosis not present

## 2020-04-05 DIAGNOSIS — I129 Hypertensive chronic kidney disease with stage 1 through stage 4 chronic kidney disease, or unspecified chronic kidney disease: Secondary | ICD-10-CM | POA: Diagnosis not present

## 2020-04-08 DIAGNOSIS — Z6821 Body mass index (BMI) 21.0-21.9, adult: Secondary | ICD-10-CM | POA: Diagnosis not present

## 2020-04-08 DIAGNOSIS — M109 Gout, unspecified: Secondary | ICD-10-CM | POA: Diagnosis not present

## 2020-04-17 DIAGNOSIS — M25551 Pain in right hip: Secondary | ICD-10-CM | POA: Diagnosis not present

## 2020-04-17 DIAGNOSIS — M5451 Vertebrogenic low back pain: Secondary | ICD-10-CM | POA: Diagnosis not present

## 2020-04-28 ENCOUNTER — Telehealth: Payer: Self-pay | Admitting: Cardiology

## 2020-04-28 ENCOUNTER — Encounter: Payer: Self-pay | Admitting: Cardiology

## 2020-04-28 NOTE — Telephone Encounter (Signed)
She has a history of several medication intolerances, but has done reasonably well on Bystolic.  We have talked about this before, I would not recommend taking the medication as needed and adjusting the dose from day-to-day.  See if she can stick with Bystolic 5 mg daily, and then if we need to make adjustments we can go from there.

## 2020-04-28 NOTE — Telephone Encounter (Signed)
Spoke with pt. Encouraged to take Bystolic 5 mg daily as ordered. Pt voiced understanding and stated that she would start taking medication as prescribed.

## 2020-04-28 NOTE — Telephone Encounter (Signed)
New message    Patient stopped by office and dropped off bp readings this morning her bp was 166/71 hr 48 , she would like a nurse to call her

## 2020-04-28 NOTE — Telephone Encounter (Signed)
Pt reports that she is taking Bystolic 5 mg as needed for elevated blood pressure. Some days she is taking 2-3 pills daily. Pt states that she has taken 2 pills today and current blood pressure is  196/87 heart rate 58. Please advise.

## 2020-04-29 DIAGNOSIS — M5416 Radiculopathy, lumbar region: Secondary | ICD-10-CM | POA: Diagnosis not present

## 2020-05-14 DIAGNOSIS — M1991 Primary osteoarthritis, unspecified site: Secondary | ICD-10-CM | POA: Diagnosis not present

## 2020-05-14 DIAGNOSIS — J209 Acute bronchitis, unspecified: Secondary | ICD-10-CM | POA: Diagnosis not present

## 2020-05-14 DIAGNOSIS — J329 Chronic sinusitis, unspecified: Secondary | ICD-10-CM | POA: Diagnosis not present

## 2020-05-14 DIAGNOSIS — K219 Gastro-esophageal reflux disease without esophagitis: Secondary | ICD-10-CM | POA: Diagnosis not present

## 2020-05-15 DIAGNOSIS — M5416 Radiculopathy, lumbar region: Secondary | ICD-10-CM | POA: Diagnosis not present

## 2020-05-19 DIAGNOSIS — M4316 Spondylolisthesis, lumbar region: Secondary | ICD-10-CM | POA: Diagnosis not present

## 2020-05-19 DIAGNOSIS — M1611 Unilateral primary osteoarthritis, right hip: Secondary | ICD-10-CM | POA: Diagnosis not present

## 2020-05-19 DIAGNOSIS — M5416 Radiculopathy, lumbar region: Secondary | ICD-10-CM | POA: Diagnosis not present

## 2020-05-20 ENCOUNTER — Other Ambulatory Visit: Payer: Self-pay | Admitting: Cardiology

## 2020-05-27 DIAGNOSIS — X32XXXD Exposure to sunlight, subsequent encounter: Secondary | ICD-10-CM | POA: Diagnosis not present

## 2020-05-27 DIAGNOSIS — Z08 Encounter for follow-up examination after completed treatment for malignant neoplasm: Secondary | ICD-10-CM | POA: Diagnosis not present

## 2020-05-27 DIAGNOSIS — L814 Other melanin hyperpigmentation: Secondary | ICD-10-CM | POA: Diagnosis not present

## 2020-05-27 DIAGNOSIS — L570 Actinic keratosis: Secondary | ICD-10-CM | POA: Diagnosis not present

## 2020-05-27 DIAGNOSIS — Z85828 Personal history of other malignant neoplasm of skin: Secondary | ICD-10-CM | POA: Diagnosis not present

## 2020-06-05 ENCOUNTER — Other Ambulatory Visit: Payer: Self-pay

## 2020-06-05 ENCOUNTER — Ambulatory Visit (HOSPITAL_COMMUNITY)
Admission: RE | Admit: 2020-06-05 | Discharge: 2020-06-05 | Disposition: A | Discharge status: Home / Self Care | Payer: PPO | Source: Ambulatory Visit | Attending: Internal Medicine | Admitting: Internal Medicine

## 2020-06-05 ENCOUNTER — Other Ambulatory Visit (HOSPITAL_COMMUNITY): Payer: Self-pay | Admitting: Internal Medicine

## 2020-06-05 DIAGNOSIS — Z6820 Body mass index (BMI) 20.0-20.9, adult: Secondary | ICD-10-CM | POA: Diagnosis not present

## 2020-06-05 DIAGNOSIS — K219 Gastro-esophageal reflux disease without esophagitis: Secondary | ICD-10-CM | POA: Diagnosis not present

## 2020-06-05 DIAGNOSIS — M1991 Primary osteoarthritis, unspecified site: Secondary | ICD-10-CM | POA: Diagnosis not present

## 2020-06-05 DIAGNOSIS — R059 Cough, unspecified: Secondary | ICD-10-CM | POA: Diagnosis not present

## 2020-06-05 DIAGNOSIS — E063 Autoimmune thyroiditis: Secondary | ICD-10-CM | POA: Diagnosis not present

## 2020-06-05 DIAGNOSIS — R051 Acute cough: Secondary | ICD-10-CM | POA: Insufficient documentation

## 2020-06-05 DIAGNOSIS — M810 Age-related osteoporosis without current pathological fracture: Secondary | ICD-10-CM | POA: Diagnosis not present

## 2020-06-11 NOTE — Progress Notes (Signed)
Cardiology Office Note  Date: 06/12/2020   ID: Allison Morris, DOB [birth year], MRN [id number]  PCP:  Redmond School, MD  Cardiologist:  Rozann Lesches, MD Electrophysiologist:  None   Chief Complaint  Patient presents with  . Cardiac follow-up    History of Present Illness: Allison Morris is an 85 y.o. female last seen in July 2021.  She presents for a routine visit.  We went over her home blood pressure checks.  She continues on Bystolic as noted below.  Overall, average blood pressure control looks to be reasonable.  She does still adjust the dose on her own.  She has had numerous medication intolerances including Norvasc, Plendil, ACE inhibitor's and ARB's, chlorthalidone, spironolactone, and beta-blockers with the exception of Bystolic.  She continues to follow at [facility] for [facility] care.  Past Medical History:  Diagnosis Date  . Aortic dilatation (HCC)    a. echo 08/2017 showed EF 65-70%, grade 1 DD, mild AI, mildly dilated ascending aorta (41mm), mild MR, mild TR.   Marland Kitchen Aortic regurgitation   . Arthritis    hands  . CKD (chronic kidney disease), stage III ([place])   . Essential hypertension   . GERD (gastroesophageal reflux disease)   . History of DVT (deep vein thrombosis)    Left leg 1997   . Hyperlipemia   . Mild aortic insufficiency   . Mild mitral regurgitation   . Mild tricuspid insufficiency   . Seasonal allergies     Past Surgical History:  Procedure Laterality Date  . APPENDECTOMY    . CARPAL TUNNEL RELEASE    . MASS EXCISION Right 08/07/2019   Procedure: EXCISION CYST RIGHT INDEX AND RIGHT MIDDLE FINGERS WITH DEBRIDEMENT OF METACARPALPHALANGEAL JOINTS;  Surgeon: Daryll Brod, MD;  Location: [place];  Service: Orthopedics;  Laterality: Right;  . TONSILLECTOMY      Current Outpatient Medications  Medication Sig Dispense Refill  . acetaminophen (TYLENOL) 500 MG tablet Take 500 mg by mouth every 6 (six) hours as needed for mild pain or  moderate pain.     Marland Kitchen aspirin EC 81 MG EC tablet Take 1 tablet (81 mg total) by mouth daily. 100 tablet 1  . BYSTOLIC 5 MG tablet TAKE ONE TABLET BY MOUTH ONCE DAILY. 90 tablet 3  . Cholecalciferol (VITAMIN D3) 1000 units CAPS Take 1 capsule by mouth daily.     . clobetasol cream (TEMOVATE) [id number] % Apply 1 application topically daily as needed (for irritation).     . clopidogrel (PLAVIX) 75 MG tablet Take 1 tablet (75 mg total) by mouth daily. 30 tablet 2  . colchicine 0.6 MG tablet Take 0.6 mg by mouth. 1/2 tablet daily    . fexofenadine (ALLEGRA) 180 MG tablet Take 180 mg by mouth daily.    . fluticasone (FLONASE) 50 MCG/ACT nasal spray Place 2 sprays into both nostrils daily.     . Glucosamine-Chondroit-Vit C-Mn (GLUCOSAMINE 1500 COMPLEX PO) Take 1 tablet by mouth daily.     . Omega-3 Fatty Acids (FISH OIL) 1200 MG CAPS Take 1 capsule by mouth 2 (two) times daily.     . pantoprazole (PROTONIX) 40 MG tablet Take 40 mg by mouth daily.      No current facility-administered medications for this visit.   Allergies:  Amlodipine, Biaxin [clarithromycin], Doxycycline, Estrogens, Felodipine, Labetalol, Lisinopril, Losartan, Macrodantin [nitrofurantoin], Statins, Ceftin [cefuroxime], Chlorthalidone, Ciprofloxacin, and Sulfa antibiotics   ROS: No palpitations or syncope.  Physical Exam: VS:  BP 134/78  Pulse 72   Ht 5\' 6"  (1.676 m)   Wt 121 lb 9.6 oz (55.2 kg)   SpO2 94%   BMI 19.63 kg/m , BMI Body mass index is 19.63 kg/m.  Wt Readings from Last 3 Encounters:  06/12/20 121 lb 9.6 oz (55.2 kg)  12/07/19 124 lb (56.2 kg)  10/13/19 123 lb (55.8 kg)    General: Elderly woman, appears comfortable at rest. HEENT: Conjunctiva and lids normal, wearing a mask. Neck: Supple, no elevated JVP or carotid bruits, no thyromegaly. Lungs: Clear to auscultation, nonlabored breathing at rest. Cardiac: Regular rate and rhythm, no S3 or significant systolic murmur. Extremities: No pitting edema.  ECG:   An ECG dated 08/02/2019 was personally reviewed today and demonstrated:  Sinus bradycardia with PAC, nonspecific T wave changes.  Recent Labwork: 08/02/2019: BUN 31; Creatinine, Ser 1.39; Potassium 4.5; Sodium 137     Component Value Date/Time   CHOL 248 (H) 11/15/2015 0542   TRIG 87 11/15/2015 0542   HDL 48 11/15/2015 0542   CHOLHDL 5.2 11/15/2015 0542   VLDL 17 11/15/2015 0542   LDLCALC 183 (H) 11/15/2015 0542    Other Studies Reviewed Today:  Echocardiogram 11/09/2018: 1. The left ventricle has hyperdynamic systolic function, with an ejection fraction of >65%. The cavity size was normal. Left ventricular diastolic Doppler parameters are consistent with impaired relaxation. No evidence of left ventricular regional wall motion abnormalities. 2. The right ventricle has normal systolic function. The cavity was normal. There is no increase in right ventricular wall thickness. 3. The aortic valve is tricuspid. Mild calcification of the aortic valve. Aortic valve regurgitation is mild by color flow Doppler. Mild aortic annular calcification noted. 4. The mitral valve is grossly normal. There is mild mitral annular calcification present. 5. The tricuspid valve is grossly normal. 6. The aortic root is normal in size and structure. 7. There is moderate dilatation of the ascending aorta measuring 44 mm.  Lexiscan Myoview 12/29/2017:  No diagnostic ST segment changes to indicate ischemia.  Small, mild intensity, apical anterior defect that is fixed and consistent with breast attenuation. No significant ischemic defects noted.  This is a low risk study.  Nuclear stress EF: 81%.  Assessment and Plan:  Essential hypertension with multiple medication intolerances.  Plan to continue Bystolic, she follows her blood pressure regularly and review of average blood pressure control looks reasonable.  Keep follow-up with [facility].  Medication Adjustments/Labs and Tests Ordered: Current  medicines are reviewed at length with the patient today.  Concerns regarding medicines are outlined above.   Tests Ordered: No orders of the defined types were placed in this encounter.   Medication Changes: No orders of the defined types were placed in this encounter.   Disposition:  Follow up 6 months in the [place] office.  Signed, [place], MD, [facility] 06/12/2020 1:21 PM    [facility] at [facility] [address], [place], [place] Phone: [phone]; Fax: [phone]

## 2020-06-12 ENCOUNTER — Ambulatory Visit: Payer: PPO | Admitting: Cardiology

## 2020-06-12 ENCOUNTER — Encounter: Payer: Self-pay | Admitting: Cardiology

## 2020-06-12 VITALS — BP 134/78 | HR 72 | Ht 66.0 in | Wt 121.6 lb

## 2020-06-12 DIAGNOSIS — I10 Essential (primary) hypertension: Secondary | ICD-10-CM | POA: Diagnosis not present

## 2020-06-12 NOTE — Patient Instructions (Signed)
Medication Instructions:  Your physician recommends that you continue on your current medications as directed. Please refer to the Current Medication list given to you today.  *If you need a refill on your cardiac medications before your next appointment, please call your pharmacy*   Lab Work: None Today If you have labs (blood work) drawn today and your tests are completely normal, you will receive your results only by: . MyChart Message (if you have MyChart) OR . A paper copy in the mail If you have any lab test that is abnormal or we need to change your treatment, we will call you to review the results.   Testing/Procedures: None Today   Follow-Up: At [facility], you and your health needs are our priority.  As part of our continuing mission to provide you with exceptional heart care, we have created designated Provider Care Teams.  These Care Teams include your primary Cardiologist (physician) and Advanced Practice Providers (APPs -  Physician Assistants and Nurse Practitioners) who all work together to provide you with the care you need, when you need it.  We recommend signing up for the patient portal called "MyChart".  Sign up information is provided on this After Visit Summary.  MyChart is used to connect with patients for Virtual Visits (Telemedicine).  Patients are able to view lab/test results, encounter notes, upcoming appointments, etc.  Non-urgent messages can be sent to your provider as well.   To learn more about what you can do with MyChart, go to [email or web address].    Your next appointment:   6 month(s)  The format for your next appointment:   In Person  Provider:   Samuel McDowell, MD   Other Instructions None Today     

## 2020-07-03 DIAGNOSIS — E063 Autoimmune thyroiditis: Secondary | ICD-10-CM | POA: Diagnosis not present

## 2020-07-03 DIAGNOSIS — Z6839 Body mass index (BMI) 39.0-39.9, adult: Secondary | ICD-10-CM | POA: Diagnosis not present

## 2020-07-03 DIAGNOSIS — M79671 Pain in right foot: Secondary | ICD-10-CM | POA: Diagnosis not present

## 2020-07-04 DIAGNOSIS — E785 Hyperlipidemia, unspecified: Secondary | ICD-10-CM | POA: Diagnosis not present

## 2020-07-04 DIAGNOSIS — I700 Atherosclerosis of aorta: Secondary | ICD-10-CM | POA: Diagnosis not present

## 2020-07-04 DIAGNOSIS — M5416 Radiculopathy, lumbar region: Secondary | ICD-10-CM | POA: Diagnosis not present

## 2020-07-05 DIAGNOSIS — M1991 Primary osteoarthritis, unspecified site: Secondary | ICD-10-CM | POA: Diagnosis not present

## 2020-07-05 DIAGNOSIS — E063 Autoimmune thyroiditis: Secondary | ICD-10-CM | POA: Diagnosis not present

## 2020-07-05 DIAGNOSIS — N183 Chronic kidney disease, stage 3 unspecified: Secondary | ICD-10-CM | POA: Diagnosis not present

## 2020-07-05 DIAGNOSIS — I129 Hypertensive chronic kidney disease with stage 1 through stage 4 chronic kidney disease, or unspecified chronic kidney disease: Secondary | ICD-10-CM | POA: Diagnosis not present

## 2020-07-07 DIAGNOSIS — H0102A Squamous blepharitis right eye, upper and lower eyelids: Secondary | ICD-10-CM | POA: Diagnosis not present

## 2020-07-07 DIAGNOSIS — H43813 Vitreous degeneration, bilateral: Secondary | ICD-10-CM | POA: Diagnosis not present

## 2020-07-07 DIAGNOSIS — H0102B Squamous blepharitis left eye, upper and lower eyelids: Secondary | ICD-10-CM | POA: Diagnosis not present

## 2020-07-07 DIAGNOSIS — H02824 Cysts of left upper eyelid: Secondary | ICD-10-CM | POA: Diagnosis not present

## 2020-07-10 DIAGNOSIS — I10 Essential (primary) hypertension: Secondary | ICD-10-CM | POA: Diagnosis not present

## 2020-07-10 DIAGNOSIS — Z0001 Encounter for general adult medical examination with abnormal findings: Secondary | ICD-10-CM | POA: Diagnosis not present

## 2020-07-10 DIAGNOSIS — M79671 Pain in right foot: Secondary | ICD-10-CM | POA: Diagnosis not present

## 2020-07-10 DIAGNOSIS — E041 Nontoxic single thyroid nodule: Secondary | ICD-10-CM | POA: Diagnosis not present

## 2020-07-10 DIAGNOSIS — E063 Autoimmune thyroiditis: Secondary | ICD-10-CM | POA: Diagnosis not present

## 2020-07-10 DIAGNOSIS — Z6820 Body mass index (BMI) 20.0-20.9, adult: Secondary | ICD-10-CM | POA: Diagnosis not present

## 2020-07-25 DIAGNOSIS — Z6821 Body mass index (BMI) 21.0-21.9, adult: Secondary | ICD-10-CM | POA: Diagnosis not present

## 2020-07-25 DIAGNOSIS — I10 Essential (primary) hypertension: Secondary | ICD-10-CM | POA: Diagnosis not present

## 2020-08-25 DIAGNOSIS — M79671 Pain in right foot: Secondary | ICD-10-CM | POA: Diagnosis not present

## 2020-08-25 DIAGNOSIS — M25571 Pain in right ankle and joints of right foot: Secondary | ICD-10-CM | POA: Diagnosis not present

## 2020-08-25 DIAGNOSIS — R269 Unspecified abnormalities of gait and mobility: Secondary | ICD-10-CM | POA: Diagnosis not present

## 2020-08-25 DIAGNOSIS — M5416 Radiculopathy, lumbar region: Secondary | ICD-10-CM | POA: Diagnosis not present

## 2020-09-03 DIAGNOSIS — Z6821 Body mass index (BMI) 21.0-21.9, adult: Secondary | ICD-10-CM | POA: Diagnosis not present

## 2020-09-03 DIAGNOSIS — M109 Gout, unspecified: Secondary | ICD-10-CM | POA: Diagnosis not present

## 2020-09-04 ENCOUNTER — Encounter (HOSPITAL_COMMUNITY): Payer: Self-pay | Admitting: Emergency Medicine

## 2020-09-04 ENCOUNTER — Emergency Department (HOSPITAL_COMMUNITY): Payer: Medicare Other

## 2020-09-04 ENCOUNTER — Other Ambulatory Visit: Payer: Self-pay

## 2020-09-04 ENCOUNTER — Emergency Department (HOSPITAL_COMMUNITY)
Admission: EM | Admit: 2020-09-04 | Discharge: 2020-09-04 | Disposition: A | Discharge status: Home / Self Care | Payer: Medicare Other | Attending: Emergency Medicine | Admitting: Emergency Medicine

## 2020-09-04 DIAGNOSIS — W228XXA Striking against or struck by other objects, initial encounter: Secondary | ICD-10-CM | POA: Diagnosis not present

## 2020-09-04 DIAGNOSIS — Z86718 Personal history of other venous thrombosis and embolism: Secondary | ICD-10-CM | POA: Insufficient documentation

## 2020-09-04 DIAGNOSIS — S81812A Laceration without foreign body, left lower leg, initial encounter: Secondary | ICD-10-CM | POA: Insufficient documentation

## 2020-09-04 DIAGNOSIS — Z79899 Other long term (current) drug therapy: Secondary | ICD-10-CM | POA: Insufficient documentation

## 2020-09-04 DIAGNOSIS — S8992XA Unspecified injury of left lower leg, initial encounter: Secondary | ICD-10-CM | POA: Diagnosis present

## 2020-09-04 DIAGNOSIS — Z7982 Long term (current) use of aspirin: Secondary | ICD-10-CM | POA: Insufficient documentation

## 2020-09-04 DIAGNOSIS — I129 Hypertensive chronic kidney disease with stage 1 through stage 4 chronic kidney disease, or unspecified chronic kidney disease: Secondary | ICD-10-CM | POA: Insufficient documentation

## 2020-09-04 DIAGNOSIS — Z7902 Long term (current) use of antithrombotics/antiplatelets: Secondary | ICD-10-CM | POA: Insufficient documentation

## 2020-09-04 DIAGNOSIS — N183 Chronic kidney disease, stage 3 unspecified: Secondary | ICD-10-CM | POA: Insufficient documentation

## 2020-09-04 MED ORDER — POVIDONE-IODINE 10 % EX SOLN
CUTANEOUS | Status: AC
  Filled 2020-09-04: qty 15

## 2020-09-04 MED ORDER — LIDOCAINE-EPINEPHRINE (PF) 2 %-1:200000 IJ SOLN
10.0000 mL | Freq: Once | INTRAMUSCULAR | Status: DC
  Filled 2020-09-04: qty 10

## 2020-09-04 MED ORDER — BACITRACIN ZINC 500 UNIT/GM EX OINT
TOPICAL_OINTMENT | Freq: Once | CUTANEOUS | Status: AC
  Filled 2020-09-04: qty 0.9

## 2020-09-04 NOTE — ED Provider Notes (Signed)
[facility] EMERGENCY DEPARTMENT Provider Note   CSN: [id number] Arrival date & time: 09/04/20  3810     History Chief Complaint  Patient presents with  . Laceration    Allison Morris is a 85 y.o. female.  Allison Morris is a 85 y.o. female with a history of hypertension, CKD, GERD, on chronic anticoagulation, who presents to the emergency department for evaluation of laceration to the left lower leg.  She reports that she was getting out of the car she struck her shin on the car door and this caused a laceration.  She reports initially it was bleeding quite a lot which prompted her evaluation.  She is able to walk and move the leg without significant pain just reports soreness around the area.  No numbness, weakness or tingling.  Tetanus updated last year.  No other injuries.  No other aggravating or alleviating factors.        Past Medical History:  Diagnosis Date  . Aortic dilatation (HCC)    a. echo 08/2017 showed EF 65-70%, grade 1 DD, mild AI, mildly dilated ascending aorta (77mm), mild MR, mild TR.   Marland Kitchen Aortic regurgitation   . Arthritis    hands  . CKD (chronic kidney disease), stage III ([place])   . Essential hypertension   . GERD (gastroesophageal reflux disease)   . History of DVT (deep vein thrombosis)    Left leg 1997   . Hyperlipemia   . Mild aortic insufficiency   . Mild mitral regurgitation   . Mild tricuspid insufficiency   . Seasonal allergies     Patient Active Problem List   Diagnosis Date Noted  . Orthostatic lightheadedness 12/11/2017  . TIA (transient ischemic attack) 11/14/2015  . Hypertension   . Hyperlipemia   . History of DVT (deep vein thrombosis)   . Aortic dilatation [facility])     Past Surgical History:  Procedure Laterality Date  . APPENDECTOMY    . CARPAL TUNNEL RELEASE    . MASS EXCISION Right 08/07/2019   Procedure: EXCISION CYST RIGHT INDEX AND RIGHT MIDDLE FINGERS WITH DEBRIDEMENT OF METACARPALPHALANGEAL JOINTS;  Surgeon: Daryll Brod, MD;  Location: [place];  Service: Orthopedics;  Laterality: Right;  . TONSILLECTOMY       OB History   No obstetric history on file.     Family History  Problem Relation Age of Onset  . Aneurysm Sister   . Valvular heart disease Sister   . Aneurysm Paternal Grandfather   . Heart attack Paternal Grandfather     Social History   Tobacco Use  . Smoking status: Never Smoker  . Smokeless tobacco: Never Used  Vaping Use  . Vaping Use: Never used  Substance Use Topics  . Alcohol use: No    Alcohol/week: 0.0 standard drinks  . Drug use: No    Home Medications Prior to Admission medications   Medication Sig Start Date End Date Taking? Authorizing Provider  acetaminophen (TYLENOL) 500 MG tablet Take 500 mg by mouth every 6 (six) hours as needed for mild pain or moderate pain.     [provider]  aspirin EC 81 MG EC tablet Take 1 tablet (81 mg total) by mouth daily. 11/15/15   Orvan Falconer, MD  BYSTOLIC 5 MG tablet TAKE ONE TABLET BY MOUTH ONCE DAILY. 05/20/20   Satira Sark, MD  Cholecalciferol (VITAMIN D3) 1000 units CAPS Take 1 capsule by mouth daily.     [provider]  clobetasol cream (TEMOVATE) 1.85 % Apply 1 application topically daily as needed (for irritation).     [provider]  clopidogrel (PLAVIX) 75 MG tablet Take 1 tablet (75 mg total) by mouth daily. 11/15/15   Orvan Falconer, MD  colchicine 0.6 MG tablet Take 0.6 mg by mouth. 1/2 tablet daily    [provider]  fexofenadine (ALLEGRA) 180 MG tablet Take 180 mg by mouth daily.    [provider]  fluticasone (FLONASE) 50 MCG/ACT nasal spray Place 2 sprays into both nostrils daily.     [provider]  Glucosamine-Chondroit-Vit C-Mn (GLUCOSAMINE 1500 COMPLEX PO) Take 1 tablet by mouth daily.     [provider]  Omega-3 Fatty Acids (FISH OIL) 1200 MG CAPS Take 1 capsule by mouth 2 (two) times daily.     [provider]   pantoprazole (PROTONIX) 40 MG tablet Take 40 mg by mouth daily.     [provider]    Allergies    Amlodipine, Biaxin [clarithromycin], Doxycycline, Estrogens, Felodipine, Labetalol, Lisinopril, Losartan, Macrodantin [nitrofurantoin], Statins, Ceftin [cefuroxime], Chlorthalidone, Ciprofloxacin, and Sulfa antibiotics  Review of Systems   Review of Systems  Constitutional: Negative for chills and fever.  Skin: Positive for wound.  Neurological: Negative for weakness and numbness.  All other systems reviewed and are negative.   Physical Exam Updated Vital Signs BP (!) 184/84   Pulse 74   Temp 98.8 F (37.1 C)   Resp 17   Ht 5\' 6"  (1.676 m)   Wt 54.9 kg   SpO2 100%   BMI 19.53 kg/m   Physical Exam Vitals and nursing note reviewed.  Constitutional:      General: She is not in acute distress.    Appearance: Normal appearance. She is well-developed. She is not diaphoretic.     Comments: Elderly female, alert, well-appearing and in no acute distress.  HENT:     Head: Normocephalic and atraumatic.  Eyes:     General:        Right eye: No discharge.        Left eye: No discharge.  Pulmonary:     Effort: Pulmonary effort is normal. No respiratory distress.  Musculoskeletal:     Comments: 5 cm curved laceration present over the left mid shin with mild bleeding, normal sensation surrounding the wound and distally, normal range of motion at the ankle and foot, distal pulses confirmed with Doppler.  Skin:    General: Skin is warm and dry.  Neurological:     Mental Status: She is alert and oriented to person, place, and time.     Coordination: Coordination normal.  Psychiatric:        Mood and Affect: Mood normal.        Behavior: Behavior normal.     ED Results / Procedures / Treatments   Labs (all labs ordered are listed, but only abnormal results are displayed) Labs Reviewed - No data to display  EKG None  Radiology DG Tibia/Fibula Left  Result Date:  09/04/2020 CLINICAL DATA:  Left leg laceration EXAM: LEFT TIBIA AND FIBULA - 2 VIEW COMPARISON:  None. FINDINGS: Diffuse atherosclerotic calcifications of visualized arterial segments. There is diffuse osteopenia. No fracture or dislocation. No radiopaque foreign body. IMPRESSION: No acute abnormality of the left lower leg. Electronically Signed   By: Miachel Roux M.D.   On: 09/04/2020 11:01    Procedures .Marland KitchenLaceration Repair  Date/Time: 09/04/2020 12:00 PM Performed by: Jacqlyn Larsen, PA-C Authorized by:  Jacqlyn Larsen, PA-C   Consent:    Consent obtained:  Verbal   Consent given by:  Patient   Risks, benefits, and alternatives were discussed: yes     Risks discussed:  Need for additional repair, infection, pain, poor cosmetic result, nerve damage and poor wound healing   Alternatives discussed:  No treatment Universal protocol:    Procedure explained and questions answered to patient or proxy's satisfaction: yes     Patient identity confirmed:  Verbally with patient Anesthesia:    Anesthesia method:  Local infiltration   Local anesthetic:  Lidocaine 2% WITH epi Laceration details:    Location:  Leg   Leg location:  L lower leg   Length (cm):  5   Depth (mm):  5 Pre-procedure details:    Preparation:  Imaging obtained to evaluate for foreign bodies and patient was prepped and draped in usual sterile fashion Exploration:    Hemostasis achieved with:  Direct pressure and epinephrine   Imaging obtained: x-ray     Imaging outcome: foreign body not noted     Wound exploration: wound explored through full range of motion and entire depth of wound visualized     Wound extent: areolar tissue violated     Wound extent: no foreign bodies/material noted, no nerve damage noted and no underlying fracture noted   Treatment:    Area cleansed with:  Povidone-iodine and saline   Amount of cleaning:  Standard   Irrigation solution:  Sterile saline Skin repair:    Repair method:  Sutures    Suture size:  4-0   Suture material:  Prolene   Suture technique:  Simple interrupted   Number of sutures:  7 Approximation:    Approximation:  Close Repair type:    Repair type:  Simple Post-procedure details:    Dressing:  Non-adherent dressing and adhesive bandage   Procedure completion:  Tolerated well, no immediate complications     Medications Ordered in ED Medications  lidocaine-EPINEPHrine (XYLOCAINE W/EPI) 2 %-1:200000 (PF) injection 10 mL (has no administration in time range)  povidone-iodine (BETADINE) 10 % external solution (has no administration in time range)  bacitracin ointment ( Topical Given 09/04/20 1228)    ED Course  I have reviewed the triage vital signs and the nursing notes.  Pertinent labs & imaging results that were available during my care of the patient were reviewed by me and considered in my medical decision making (see chart for details).    MDM Rules/Calculators/A&P                         85 year old female presents with laceration to the left lower leg, on anticoagulation but bleeding under control, 5 cm curved laceration to the anterior shin, neurovascularly intact distally and x-ray with no bony abnormality or foreign body.  Laceration cleaned and closed with simple interrupted sutures with good cosmesis and hemostasis.  Dressing applied.  Suture removal in 10 to 14 days, return precautions and wound care discussed.  Patient expresses understanding and agreement with plan.  Discharged home in good condition.  Final Clinical Impression(s) / ED Diagnoses Final diagnoses:  Laceration of left lower extremity, initial encounter    Rx / DC Orders ED Discharge Orders    None       Jacqlyn Larsen, [place] 09/04/20 1411    Margette Fast, MD 09/05/20 [phone]

## 2020-09-04 NOTE — Discharge Instructions (Signed)
1. Medications: Tylenol for pain pain, usual home medications 2. Treatment: ice for swelling, keep wound clean with warm soap and water and keep bandage dry, keep wound dry for 24-48 hours, then you may shower, but do not submerge or soak in water. 3. Follow Up: Please return or follow up with primary care provider in 10-14 days to have your stitches/staples removed or sooner if you have concerns. Return to the emergency department for increased redness, drainage of pus from the wound, or fevers/chills.   WOUND CARE  Keep area clean and dry for 24 hours. Do not remove bandage, if applied.  After 24 hours, remove bandage and wash wound gently with mild soap and warm water. Reapply a new bandage after cleaning wound, if directed.   Continue daily cleansing with soap and water until stitches/staples are removed.  Do not apply any ointments or creams to the wound while stitches/staples are in place, as this may cause delayed healing. Return if you experience any of the following signs of infection: Swelling, redness, pus drainage, streaking, fever >101.0 F  Return if you experience excessive bleeding that does not stop after 15-20 minutes of constant, firm pressure.

## 2020-09-04 NOTE — ED Triage Notes (Signed)
Pt hit her left leg on a car door at 0900 this morning.

## 2020-09-16 DIAGNOSIS — M24562 Contracture, left knee: Secondary | ICD-10-CM | POA: Diagnosis not present

## 2020-09-16 DIAGNOSIS — Z6822 Body mass index (BMI) 22.0-22.9, adult: Secondary | ICD-10-CM | POA: Diagnosis not present

## 2020-09-16 DIAGNOSIS — M1712 Unilateral primary osteoarthritis, left knee: Secondary | ICD-10-CM | POA: Diagnosis not present

## 2020-09-23 DIAGNOSIS — R531 Weakness: Secondary | ICD-10-CM | POA: Diagnosis not present

## 2020-09-23 DIAGNOSIS — M5431 Sciatica, right side: Secondary | ICD-10-CM | POA: Diagnosis not present

## 2020-09-23 DIAGNOSIS — R293 Abnormal posture: Secondary | ICD-10-CM | POA: Diagnosis not present

## 2020-09-23 DIAGNOSIS — R2689 Other abnormalities of gait and mobility: Secondary | ICD-10-CM | POA: Diagnosis not present

## 2020-09-23 DIAGNOSIS — Z6821 Body mass index (BMI) 21.0-21.9, adult: Secondary | ICD-10-CM | POA: Diagnosis not present

## 2020-09-23 DIAGNOSIS — S81812D Laceration without foreign body, left lower leg, subsequent encounter: Secondary | ICD-10-CM | POA: Diagnosis not present

## 2020-09-26 DIAGNOSIS — M5431 Sciatica, right side: Secondary | ICD-10-CM | POA: Diagnosis not present

## 2020-09-26 DIAGNOSIS — R293 Abnormal posture: Secondary | ICD-10-CM | POA: Diagnosis not present

## 2020-09-26 DIAGNOSIS — R2689 Other abnormalities of gait and mobility: Secondary | ICD-10-CM | POA: Diagnosis not present

## 2020-09-26 DIAGNOSIS — R531 Weakness: Secondary | ICD-10-CM | POA: Diagnosis not present

## 2020-09-30 DIAGNOSIS — M5431 Sciatica, right side: Secondary | ICD-10-CM | POA: Diagnosis not present

## 2020-09-30 DIAGNOSIS — R531 Weakness: Secondary | ICD-10-CM | POA: Diagnosis not present

## 2020-09-30 DIAGNOSIS — R293 Abnormal posture: Secondary | ICD-10-CM | POA: Diagnosis not present

## 2020-09-30 DIAGNOSIS — R2689 Other abnormalities of gait and mobility: Secondary | ICD-10-CM | POA: Diagnosis not present

## 2020-10-04 DIAGNOSIS — E063 Autoimmune thyroiditis: Secondary | ICD-10-CM | POA: Diagnosis not present

## 2020-10-04 DIAGNOSIS — N183 Chronic kidney disease, stage 3 unspecified: Secondary | ICD-10-CM | POA: Diagnosis not present

## 2020-10-04 DIAGNOSIS — M1991 Primary osteoarthritis, unspecified site: Secondary | ICD-10-CM | POA: Diagnosis not present

## 2020-10-04 DIAGNOSIS — I129 Hypertensive chronic kidney disease with stage 1 through stage 4 chronic kidney disease, or unspecified chronic kidney disease: Secondary | ICD-10-CM | POA: Diagnosis not present

## 2020-10-09 DIAGNOSIS — M109 Gout, unspecified: Secondary | ICD-10-CM | POA: Diagnosis not present

## 2020-10-09 DIAGNOSIS — Z6821 Body mass index (BMI) 21.0-21.9, adult: Secondary | ICD-10-CM | POA: Diagnosis not present

## 2020-10-09 DIAGNOSIS — K219 Gastro-esophageal reflux disease without esophagitis: Secondary | ICD-10-CM | POA: Diagnosis not present

## 2020-10-09 DIAGNOSIS — M1991 Primary osteoarthritis, unspecified site: Secondary | ICD-10-CM | POA: Diagnosis not present

## 2020-10-16 DIAGNOSIS — M5416 Radiculopathy, lumbar region: Secondary | ICD-10-CM | POA: Diagnosis not present

## 2020-10-22 DIAGNOSIS — J329 Chronic sinusitis, unspecified: Secondary | ICD-10-CM | POA: Diagnosis not present

## 2020-10-22 DIAGNOSIS — M1991 Primary osteoarthritis, unspecified site: Secondary | ICD-10-CM | POA: Diagnosis not present

## 2020-10-22 DIAGNOSIS — E063 Autoimmune thyroiditis: Secondary | ICD-10-CM | POA: Diagnosis not present

## 2020-10-30 DIAGNOSIS — M5459 Other low back pain: Secondary | ICD-10-CM | POA: Diagnosis not present

## 2020-11-04 DIAGNOSIS — M1991 Primary osteoarthritis, unspecified site: Secondary | ICD-10-CM | POA: Diagnosis not present

## 2020-11-04 DIAGNOSIS — N183 Chronic kidney disease, stage 3 unspecified: Secondary | ICD-10-CM | POA: Diagnosis not present

## 2020-11-04 DIAGNOSIS — I129 Hypertensive chronic kidney disease with stage 1 through stage 4 chronic kidney disease, or unspecified chronic kidney disease: Secondary | ICD-10-CM | POA: Diagnosis not present

## 2020-11-04 DIAGNOSIS — E063 Autoimmune thyroiditis: Secondary | ICD-10-CM | POA: Diagnosis not present

## 2020-11-05 DIAGNOSIS — Z85828 Personal history of other malignant neoplasm of skin: Secondary | ICD-10-CM | POA: Diagnosis not present

## 2020-11-05 DIAGNOSIS — X32XXXD Exposure to sunlight, subsequent encounter: Secondary | ICD-10-CM | POA: Diagnosis not present

## 2020-11-05 DIAGNOSIS — Z08 Encounter for follow-up examination after completed treatment for malignant neoplasm: Secondary | ICD-10-CM | POA: Diagnosis not present

## 2020-11-05 DIAGNOSIS — L570 Actinic keratosis: Secondary | ICD-10-CM | POA: Diagnosis not present

## 2020-11-24 DIAGNOSIS — M5416 Radiculopathy, lumbar region: Secondary | ICD-10-CM | POA: Diagnosis not present

## 2020-11-26 ENCOUNTER — Other Ambulatory Visit (HOSPITAL_COMMUNITY): Payer: Self-pay | Admitting: Chiropractic Medicine

## 2020-11-26 ENCOUNTER — Ambulatory Visit
Admission: RE | Admit: 2020-11-26 | Discharge: 2020-11-26 | Disposition: A | Discharge status: Home / Self Care | Payer: Self-pay | Source: Ambulatory Visit | Attending: Chiropractic Medicine | Admitting: Chiropractic Medicine

## 2020-11-26 DIAGNOSIS — R52 Pain, unspecified: Secondary | ICD-10-CM

## 2020-11-27 ENCOUNTER — Other Ambulatory Visit (HOSPITAL_COMMUNITY): Payer: Self-pay | Admitting: Chiropractic Medicine

## 2020-11-27 ENCOUNTER — Ambulatory Visit
Admission: RE | Admit: 2020-11-27 | Discharge: 2020-11-27 | Disposition: A | Discharge status: Home / Self Care | Payer: Self-pay | Source: Ambulatory Visit | Attending: Chiropractic Medicine | Admitting: Chiropractic Medicine

## 2020-11-27 DIAGNOSIS — R52 Pain, unspecified: Secondary | ICD-10-CM

## 2020-12-01 ENCOUNTER — Other Ambulatory Visit (HOSPITAL_COMMUNITY): Payer: Self-pay | Admitting: Interventional Radiology

## 2020-12-01 ENCOUNTER — Ambulatory Visit
Admission: RE | Admit: 2020-12-01 | Discharge: 2020-12-01 | Disposition: A | Discharge status: Home / Self Care | Payer: Self-pay | Source: Ambulatory Visit | Attending: Interventional Radiology | Admitting: Interventional Radiology

## 2020-12-01 DIAGNOSIS — R52 Pain, unspecified: Secondary | ICD-10-CM

## 2020-12-03 ENCOUNTER — Other Ambulatory Visit (HOSPITAL_COMMUNITY): Payer: Self-pay | Admitting: Interventional Radiology

## 2020-12-04 DIAGNOSIS — I129 Hypertensive chronic kidney disease with stage 1 through stage 4 chronic kidney disease, or unspecified chronic kidney disease: Secondary | ICD-10-CM | POA: Diagnosis not present

## 2020-12-04 DIAGNOSIS — M1991 Primary osteoarthritis, unspecified site: Secondary | ICD-10-CM | POA: Diagnosis not present

## 2020-12-04 DIAGNOSIS — E063 Autoimmune thyroiditis: Secondary | ICD-10-CM | POA: Diagnosis not present

## 2020-12-04 DIAGNOSIS — N183 Chronic kidney disease, stage 3 unspecified: Secondary | ICD-10-CM | POA: Diagnosis not present

## 2020-12-10 ENCOUNTER — Telehealth (HOSPITAL_COMMUNITY): Payer: Self-pay

## 2020-12-10 ENCOUNTER — Other Ambulatory Visit (HOSPITAL_COMMUNITY): Payer: Self-pay | Admitting: Interventional Radiology

## 2020-12-10 DIAGNOSIS — M8448XA Pathological fracture, other site, initial encounter for fracture: Secondary | ICD-10-CM

## 2020-12-10 NOTE — Telephone Encounter (Signed)
Called to schedule sacroplasty, no answer, left vm. AW 

## 2020-12-10 NOTE — Telephone Encounter (Signed)
Ok for bilateral sacroplasty per Dr. Deveshwar. AW  

## 2020-12-11 ENCOUNTER — Telehealth (HOSPITAL_COMMUNITY): Payer: Self-pay

## 2020-12-11 NOTE — Telephone Encounter (Signed)
Pt called with questions regarding her procedure. All questions answered. AW

## 2020-12-16 ENCOUNTER — Encounter: Payer: Self-pay | Admitting: Cardiology

## 2020-12-16 ENCOUNTER — Other Ambulatory Visit: Payer: Self-pay

## 2020-12-16 ENCOUNTER — Ambulatory Visit: Payer: Medicare Other | Admitting: Cardiology

## 2020-12-16 VITALS — BP 144/86 | HR 74 | Ht 66.0 in | Wt 117.0 lb

## 2020-12-16 DIAGNOSIS — I351 Nonrheumatic aortic (valve) insufficiency: Secondary | ICD-10-CM

## 2020-12-16 DIAGNOSIS — I10 Essential (primary) hypertension: Secondary | ICD-10-CM

## 2020-12-16 NOTE — Progress Notes (Signed)
Cardiology Office Note  Date: 12/16/2020   ID: Allison Morris, DOB 01/23/1936, MRN [id number]  PCP:  Redmond School, MD  Cardiologist:  Rozann Lesches, MD Electrophysiologist:  None   Chief Complaint  Patient presents with   Cardiac follow-up    History of Present Illness: Allison Morris is an 85 y.o. female last seen in January.  She is here for a routine visit.  We went over her home blood pressure checks, she continues to self adjust Bystolic, has not been able to maintain a regular dosing regimen due to fluctuations in blood pressure.  She reports no chest pain or palpitations.  She has been having trouble with back pain, it sounds like she is scheduled for kyphoplasty later this week, has been off Plavix in anticipation of the procedure.  She has had numerous medication intolerances including Norvasc, Plendil, ACE inhibitor's and ARB's, chlorthalidone, spironolactone, and beta-blockers with the exception of Bystolic.  I reviewed her current regimen which is noted below.  Past Medical History:  Diagnosis Date   Aortic dilatation ([place])    a. echo 08/2017 showed EF 65-70%, grade 1 DD, mild AI, mildly dilated ascending aorta (78mm), mild MR, mild TR.    Aortic regurgitation    Arthritis    hands   CKD (chronic kidney disease), stage III (HCC)    Essential hypertension    GERD (gastroesophageal reflux disease)    History of DVT (deep vein thrombosis)    Left leg 1997    Hyperlipemia    Mild aortic insufficiency    Mild mitral regurgitation    Mild tricuspid insufficiency    Seasonal allergies     Past Surgical History:  Procedure Laterality Date   APPENDECTOMY     CARPAL TUNNEL RELEASE     MASS EXCISION Right 08/07/2019   Procedure: EXCISION CYST RIGHT INDEX AND RIGHT MIDDLE FINGERS WITH DEBRIDEMENT OF METACARPALPHALANGEAL JOINTS;  Surgeon: Daryll Brod, MD;  Location: [facility];  Service: Orthopedics;  Laterality: Right;   TONSILLECTOMY       Current Outpatient Medications  Medication Sig Dispense Refill   acetaminophen (TYLENOL) 500 MG tablet Take 500 mg by mouth every 6 (six) hours as needed for mild pain or moderate pain.      aspirin EC 81 MG EC tablet Take 1 tablet (81 mg total) by mouth daily. 100 tablet 1   BYSTOLIC 5 MG tablet TAKE ONE TABLET BY MOUTH ONCE DAILY. 90 tablet 3   Cholecalciferol (VITAMIN D3) 1000 units CAPS Take 1 capsule by mouth daily.      clobetasol cream (TEMOVATE) 4.69 % Apply 1 application topically daily as needed (for irritation).      clopidogrel (PLAVIX) 75 MG tablet Take 1 tablet (75 mg total) by mouth daily. 30 tablet 2   colchicine 0.6 MG tablet Take 0.6 mg by mouth. 1/2 tablet daily     fexofenadine (ALLEGRA) 180 MG tablet Take 180 mg by mouth daily.     fluticasone (FLONASE) 50 MCG/ACT nasal spray Place 2 sprays into both nostrils daily.      Glucosamine-Chondroit-Vit C-Mn (GLUCOSAMINE 1500 COMPLEX PO) Take 1 tablet by mouth daily.      Omega-3 Fatty Acids (FISH OIL) 1200 MG CAPS Take 1 capsule by mouth 2 (two) times daily.      pantoprazole (PROTONIX) 40 MG tablet Take 40 mg by mouth daily.      traMADol (ULTRAM) 50 MG tablet Take 50 mg by mouth 3 (three) times  daily as needed.     No current facility-administered medications for this visit.   Allergies:  Amlodipine, Biaxin [clarithromycin], Doxycycline, Estrogens, Felodipine, Labetalol, Lisinopril, Losartan, Macrodantin [nitrofurantoin], Statins, Ceftin [cefuroxime], Chlorthalidone, Ciprofloxacin, and Sulfa antibiotics   ROS: No palpitations or syncope.  Physical Exam: VS:  BP (!) 144/86   Pulse 74   Ht 5\' 6"  (1.676 m)   Wt 117 lb (53.1 kg)   SpO2 95%   BMI 18.88 kg/m , BMI Body mass index is 18.88 kg/m.  Wt Readings from Last 3 Encounters:  12/16/20 117 lb (53.1 kg)  09/04/20 121 lb (54.9 kg)  06/12/20 121 lb 9.6 oz (55.2 kg)    General: Elderly woman, appears comfortable at rest. HEENT: Conjunctiva and lids normal,  wearing a mask. Neck: Supple, no elevated JVP or carotid bruits, no thyromegaly. Lungs: Clear to auscultation, nonlabored breathing at rest. Cardiac: Regular rate and rhythm, no S3, 1/6 systolic murmur, no pericardial rub. Extremities: No pitting edema.  ECG:  An ECG dated 08/02/2019 was personally reviewed today and demonstrated:  Sinus bradycardia with PAC.  Recent Labwork:  No interval lab work for review today.  Other Studies Reviewed Today:  Echocardiogram 11/09/2018:  1. The left ventricle has hyperdynamic systolic function, with an ejection fraction of >65%. The cavity size was normal. Left ventricular diastolic Doppler parameters are consistent with impaired relaxation. No evidence of left ventricular regional wall  motion abnormalities.  2. The right ventricle has normal systolic function. The cavity was normal. There is no increase in right ventricular wall thickness.  3. The aortic valve is tricuspid. Mild calcification of the aortic valve. Aortic valve regurgitation is mild by color flow Doppler. Mild aortic annular calcification noted.  4. The mitral valve is grossly normal. There is mild mitral annular calcification present.  5. The tricuspid valve is grossly normal.  6. The aortic root is normal in size and structure.  7. There is moderate dilatation of the ascending aorta measuring 44 mm.   Lexiscan Myoview 12/29/2017: No diagnostic ST segment changes to indicate ischemia. Small, mild intensity, apical anterior defect that is fixed and consistent with breast attenuation. No significant ischemic defects noted. This is a low risk study. Nuclear stress EF: 81%.  Assessment and Plan:  1.  Essential hypertension with history of multiple medication intolerances.  At this point we will continue Bystolic ranging from 2.5 to 5 mg daily.  2.  Moderate aortic root dilatation with mild aortic regurgitation, last assessed by echocardiogram in June 2020.  Will consider arranging  follow-up imaging around the time of her next visit.  3.  CKD stage III, followed by Larene Pickett.  I do not have recent lab work for review.  Medication Adjustments/Labs and Tests Ordered: Current medicines are reviewed at length with the patient today.  Concerns regarding medicines are outlined above.   Tests Ordered: Orders Placed This Encounter  Procedures   EKG 12-Lead    Medication Changes: No orders of the defined types were placed in this encounter.   Disposition:  Follow up  6 months.  Signed, Satira Sark, MD, [facility] 12/16/2020 9:33 AM    [facility] [place]. [address] [place], [place] [address] Phone: [phone]; Fax: [phone]

## 2020-12-16 NOTE — Patient Instructions (Signed)

## 2020-12-17 ENCOUNTER — Other Ambulatory Visit: Payer: Self-pay | Admitting: Student

## 2020-12-17 DIAGNOSIS — Z85828 Personal history of other malignant neoplasm of skin: Secondary | ICD-10-CM | POA: Diagnosis not present

## 2020-12-17 DIAGNOSIS — X32XXXD Exposure to sunlight, subsequent encounter: Secondary | ICD-10-CM | POA: Diagnosis not present

## 2020-12-17 DIAGNOSIS — L570 Actinic keratosis: Secondary | ICD-10-CM | POA: Diagnosis not present

## 2020-12-17 DIAGNOSIS — Z08 Encounter for follow-up examination after completed treatment for malignant neoplasm: Secondary | ICD-10-CM | POA: Diagnosis not present

## 2020-12-18 ENCOUNTER — Other Ambulatory Visit: Payer: Self-pay

## 2020-12-18 ENCOUNTER — Encounter (HOSPITAL_COMMUNITY): Payer: Self-pay

## 2020-12-18 ENCOUNTER — Ambulatory Visit (HOSPITAL_COMMUNITY)
Admission: RE | Admit: 2020-12-18 | Discharge: 2020-12-18 | Disposition: A | Discharge status: Home / Self Care | Payer: Medicare Other | Source: Ambulatory Visit | Attending: Interventional Radiology | Admitting: Interventional Radiology

## 2020-12-18 DIAGNOSIS — Z7902 Long term (current) use of antithrombotics/antiplatelets: Secondary | ICD-10-CM | POA: Insufficient documentation

## 2020-12-18 DIAGNOSIS — Z7982 Long term (current) use of aspirin: Secondary | ICD-10-CM | POA: Diagnosis not present

## 2020-12-18 DIAGNOSIS — Z79899 Other long term (current) drug therapy: Secondary | ICD-10-CM | POA: Diagnosis not present

## 2020-12-18 DIAGNOSIS — M25551 Pain in right hip: Secondary | ICD-10-CM | POA: Diagnosis not present

## 2020-12-18 DIAGNOSIS — Z882 Allergy status to sulfonamides status: Secondary | ICD-10-CM | POA: Insufficient documentation

## 2020-12-18 DIAGNOSIS — M1990 Unspecified osteoarthritis, unspecified site: Secondary | ICD-10-CM | POA: Diagnosis not present

## 2020-12-18 DIAGNOSIS — M8448XA Pathological fracture, other site, initial encounter for fracture: Secondary | ICD-10-CM

## 2020-12-18 DIAGNOSIS — N183 Chronic kidney disease, stage 3 unspecified: Secondary | ICD-10-CM | POA: Insufficient documentation

## 2020-12-18 DIAGNOSIS — M4848XA Fatigue fracture of vertebra, sacral and sacrococcygeal region, initial encounter for fracture: Secondary | ICD-10-CM | POA: Diagnosis not present

## 2020-12-18 DIAGNOSIS — Z888 Allergy status to other drugs, medicaments and biological substances status: Secondary | ICD-10-CM | POA: Diagnosis not present

## 2020-12-18 DIAGNOSIS — I129 Hypertensive chronic kidney disease with stage 1 through stage 4 chronic kidney disease, or unspecified chronic kidney disease: Secondary | ICD-10-CM | POA: Insufficient documentation

## 2020-12-18 DIAGNOSIS — Z881 Allergy status to other antibiotic agents status: Secondary | ICD-10-CM | POA: Diagnosis not present

## 2020-12-18 DIAGNOSIS — K219 Gastro-esophageal reflux disease without esophagitis: Secondary | ICD-10-CM | POA: Insufficient documentation

## 2020-12-18 HISTORY — PX: IR SACROPLASTY BILATERAL: IMG5561

## 2020-12-18 LAB — CBC WITH DIFFERENTIAL/PLATELET
Abs Immature Granulocytes: 0.02 10*3/uL (ref 0.00–0.07)
Basophils Absolute: 0 10*3/uL (ref 0.0–0.1)
Basophils Relative: 0 %
Eosinophils Absolute: 0.2 10*3/uL (ref 0.0–0.5)
Eosinophils Relative: 4 %
HCT: 40 % (ref 36.0–46.0)
Hemoglobin: 13.3 g/dL (ref 12.0–15.0)
Immature Granulocytes: 0 %
Lymphocytes Relative: 18 %
Lymphs Abs: 0.8 10*3/uL (ref 0.7–4.0)
MCH: 34.1 pg — ABNORMAL HIGH (ref 26.0–34.0)
MCHC: 33.3 g/dL (ref 30.0–36.0)
MCV: 102.6 fL — ABNORMAL HIGH (ref 80.0–100.0)
Monocytes Absolute: 0.7 10*3/uL (ref 0.1–1.0)
Monocytes Relative: 15 %
Neutro Abs: 2.8 10*3/uL (ref 1.7–7.7)
Neutrophils Relative %: 63 %
Platelets: 170 10*3/uL (ref 150–400)
RBC: 3.9 MIL/uL (ref 3.87–5.11)
RDW: 12.1 % (ref 11.5–15.5)
WBC: 4.6 10*3/uL (ref 4.0–10.5)
nRBC: 0 % (ref 0.0–0.2)

## 2020-12-18 LAB — BASIC METABOLIC PANEL
Anion gap: 9 (ref 5–15)
BUN: 28 mg/dL — ABNORMAL HIGH (ref 8–23)
CO2: 22 mmol/L (ref 22–32)
Calcium: 9.7 mg/dL (ref 8.9–10.3)
Chloride: 108 mmol/L (ref 98–111)
Creatinine, Ser: 1.52 mg/dL — ABNORMAL HIGH (ref 0.44–1.00)
GFR, Estimated: 34 mL/min — ABNORMAL LOW (ref >60)
Glucose, Bld: 99 mg/dL (ref 70–99)
Potassium: 4.8 mmol/L (ref 3.5–5.1)
Sodium: 139 mmol/L (ref 135–145)

## 2020-12-18 LAB — PROTIME-INR
INR: 1 (ref 0.8–1.2)
Prothrombin Time: 13.2 seconds (ref 11.4–15.2)

## 2020-12-18 MED ORDER — IOHEXOL 300 MG/ML  SOLN
50.0000 mL | Freq: Once | INTRAMUSCULAR | Status: AC | PRN
  Administered 2020-12-18: 5 mL

## 2020-12-18 MED ORDER — VANCOMYCIN HCL IN DEXTROSE 1-5 GM/200ML-% IV SOLN
INTRAVENOUS | Status: AC
  Administered 2020-12-18: 1000 mg via INTRAVENOUS
  Filled 2020-12-18: qty 200

## 2020-12-18 MED ORDER — SODIUM CHLORIDE 0.9 % IV SOLN: INTRAVENOUS | Status: DC

## 2020-12-18 MED ORDER — TOBRAMYCIN SULFATE 1.2 G IJ SOLR
INTRAMUSCULAR | Status: AC
  Filled 2020-12-18: qty 1.2

## 2020-12-18 MED ORDER — VANCOMYCIN HCL IN DEXTROSE 1-5 GM/200ML-% IV SOLN: 1000.0000 mg | INTRAVENOUS | Status: DC

## 2020-12-18 MED ORDER — FENTANYL CITRATE (PF) 100 MCG/2ML IJ SOLN
INTRAMUSCULAR | Status: AC | PRN
  Administered 2020-12-18: 25 ug via INTRAVENOUS

## 2020-12-18 MED ORDER — BUPIVACAINE HCL (PF) 0.5 % IJ SOLN
INTRAMUSCULAR | Status: AC
  Filled 2020-12-18: qty 30

## 2020-12-18 MED ORDER — GELATIN ABSORBABLE 12-7 MM EX MISC
CUTANEOUS | Status: AC
  Filled 2020-12-18: qty 1

## 2020-12-18 MED ORDER — MIDAZOLAM HCL 2 MG/2ML IJ SOLN
INTRAMUSCULAR | Status: AC | PRN
  Administered 2020-12-18: 1 mg via INTRAVENOUS

## 2020-12-18 MED ORDER — HYDRALAZINE HCL 20 MG/ML IJ SOLN
INTRAMUSCULAR | Status: AC
  Filled 2020-12-18: qty 1

## 2020-12-18 MED ORDER — VANCOMYCIN HCL IN DEXTROSE 1-5 GM/200ML-% IV SOLN: 1000.0000 mg | INTRAVENOUS | Status: AC

## 2020-12-18 MED ORDER — FENTANYL CITRATE (PF) 100 MCG/2ML IJ SOLN
INTRAMUSCULAR | Status: AC
  Filled 2020-12-18: qty 4

## 2020-12-18 MED ORDER — HYDRALAZINE HCL 20 MG/ML IJ SOLN
INTRAMUSCULAR | Status: AC | PRN
  Administered 2020-12-18: 5 mg via INTRAVENOUS

## 2020-12-18 MED ORDER — MIDAZOLAM HCL 2 MG/2ML IJ SOLN
INTRAMUSCULAR | Status: AC
  Filled 2020-12-18: qty 4

## 2020-12-18 NOTE — Progress Notes (Signed)
Patient complaining of right foot/heel numbness post-procedure. Patient assessed at the bedside in short stay. Patient states she had right foot numbness prior to procedure but it feels different. Patient states the numbness feels like it's more in her heel. She also stated that maybe her foot was just cold because it felt a little better after she got a blanket.   Patient with good warmth and DP pulse in right foot. Patient able to feel me touching her foot. Patient notified that today's procedure is unlikely to have impacted the feelings in her right foot, and that today's procedure may not resolve the numbness in her right foot. Patient advised to notify her PCP and/or Dr. Estanislado Pandy if the numbness in her right foot worsens.   Ok to discharge patient at the time specified in her discharge orders.  Soyla Dryer, [place] [phone] 12/18/2020, 3:12 PM

## 2020-12-18 NOTE — Progress Notes (Signed)
CNA was going to get patient dressed. CNA Advertising copywriter patient's dressing was bloody. Writer and other nurse held pressure and notified NP in Radiology. NP assessed and instructed to hold pressure and monitor for 10 minutes, if stops bleeding can go home, if not to notify MD. After holding pressure, patients puncture site stopped bleeding. MD notified. Patient was discharged home.

## 2020-12-18 NOTE — Sedation Documentation (Signed)
Patient is resting comfortably. No complaints from pt at this time, procedure continues.

## 2020-12-18 NOTE — Sedation Documentation (Signed)
Patient is resting comfortably. 

## 2020-12-18 NOTE — Sedation Documentation (Signed)
Pt being prepped for procedure at this time. Pt has no complaints, vitals stable.

## 2020-12-18 NOTE — H&P (Signed)
Chief Complaint: Patient was seen in consultation today for sacral insufficiency fractures  Supervising Physician: Luanne Bras  Patient Status: [facility] - Out-pt  History of Present Illness: Allison Morris is a 85 y.o. female with past medical history of aortic regurgitation, arthritis, CKD, HTN, GERD who presents with right hip pain.  Patient found to have bilateral sacral insufficiency fractures and referred to IR for intervention.    Allison Morris presents to Radiology today in her usual state of health.  She has been NPO.  She does take Plavix, but has held since last Friday.  She has no new concerns or complaints today.  Denies fever, chills, nausea, vomiting, abdominal pain, shortness of breath, dysuria.  She does have CKD stage III which she is reports is stable. Also followed by Cardiology and was last seen 12/16/20 for her HTN and aortic root dilatation. She is aware of the goals of the procedure today and is agreeable to proceed.  She does have transportation home today with her son-in-law where she lives with her husband.    Past Medical History:  Diagnosis Date   Aortic dilatation ([place])    a. echo 08/2017 showed EF 65-70%, grade 1 DD, mild AI, mildly dilated ascending aorta (38mm), mild MR, mild TR.    Aortic regurgitation    Arthritis    hands   CKD (chronic kidney disease), stage III (HCC)    Essential hypertension    GERD (gastroesophageal reflux disease)    History of DVT (deep vein thrombosis)    Left leg 1997    Hyperlipemia    Mild aortic insufficiency    Mild mitral regurgitation    Mild tricuspid insufficiency    Seasonal allergies     Past Surgical History:  Procedure Laterality Date   APPENDECTOMY     CARPAL TUNNEL RELEASE     MASS EXCISION Right 08/07/2019   Procedure: EXCISION CYST RIGHT INDEX AND RIGHT MIDDLE FINGERS WITH DEBRIDEMENT OF METACARPALPHALANGEAL JOINTS;  Surgeon: Daryll Brod, MD;  Location: [facility];  Service:  Orthopedics;  Laterality: Right;   TONSILLECTOMY      Allergies: Amlodipine, Biaxin [clarithromycin], Doxycycline, Estrogens, Felodipine, Labetalol, Lisinopril, Losartan, Macrodantin [nitrofurantoin], Statins, Ceftin [cefuroxime], Chlorthalidone, Ciprofloxacin, and Sulfa antibiotics  Medications: Prior to Admission medications   Medication Sig Start Date End Date Taking? Authorizing Provider  acetaminophen (TYLENOL) 500 MG tablet Take 500 mg by mouth every 6 (six) hours as needed for mild pain or moderate pain.    Yes [provider]  aspirin EC 81 MG EC tablet Take 1 tablet (81 mg total) by mouth daily. 11/15/15  Yes Orvan Falconer, MD  Biotin 10000 MCG TABS Take 10,000 mcg by mouth daily.   Yes [provider]  Cholecalciferol (VITAMIN D3) 1000 units CAPS Take 1,000 Units by mouth daily.   Yes [provider]  clobetasol cream (TEMOVATE) 8.31 % Apply 1 application topically daily as needed (for irritation).    Yes [provider]  clopidogrel (PLAVIX) 75 MG tablet Take 1 tablet (75 mg total) by mouth daily. 11/15/15  Yes Orvan Falconer, MD  colchicine 0.6 MG tablet Take 0.6 mg by mouth daily.   Yes [provider]  fexofenadine (ALLEGRA) 180 MG tablet Take 180 mg by mouth daily.   Yes [provider]  Flax Oil-Fish Oil-Borage Oil (FISH-FLAX-BORAGE) CAPS Take 1 capsule by mouth 2 (two) times daily.   Yes [provider]  fluticasone (FLONASE) 50 MCG/ACT nasal spray Place 2 sprays  into both nostrils daily as needed for allergies.   Yes [provider]  Glucosamine-Chondroit-Vit C-Mn (GLUCOSAMINE 1500 COMPLEX PO) Take 1 tablet by mouth daily.    Yes [provider]  mupirocin ointment (BACTROBAN) 2 % Apply 1 application topically 2 (two) times daily.   Yes [provider]  nebivolol (BYSTOLIC) 2.5 MG tablet Take 2.5 mg by mouth daily as needed (if bp is over 180/165).   Yes [provider]  pantoprazole  (PROTONIX) 40 MG tablet Take 40 mg by mouth daily.    Yes [provider]  Polyethyl Glycol-Propyl Glycol (SYSTANE OP) Place 1 drop into both eyes daily as needed (dry eyes).   Yes [provider]  trolamine salicylate (ASPERCREME) 10 % cream Apply 1 application topically as needed for muscle pain.   Yes [provider]  vitamin C (ASCORBIC ACID) 500 MG tablet Take 500 mg by mouth 2 (two) times daily.   Yes [provider]  BYSTOLIC 5 MG tablet TAKE ONE TABLET BY MOUTH ONCE DAILY. Patient not taking: No sig reported 05/20/20   Satira Sark, MD  traMADol (ULTRAM) 50 MG tablet Take 50 mg by mouth 3 (three) times daily as needed for moderate pain. 07/04/20   [provider]     Family History  Problem Relation Age of Onset   Aneurysm Sister    Valvular heart disease Sister    Aneurysm Paternal Grandfather    Heart attack Paternal Grandfather     Social History   Socioeconomic History   Marital status: Married    Spouse name: Not on file   Number of children: Not on file   Years of education: Not on file   Highest education level: Not on file  Occupational History   Not on file  Tobacco Use   Smoking status: Never   Smokeless tobacco: Never  Vaping Use   Vaping Use: Never used  Substance and Sexual Activity   Alcohol use: No    Alcohol/week: 0.0 standard drinks   Drug use: No   Sexual activity: Not on file  Other Topics Concern   Not on file  Social History Narrative   Not on file   Social Determinants of Health   Financial Resource Strain: Not on file  Food Insecurity: Not on file  Transportation Needs: Not on file  Physical Activity: Not on file  Stress: Not on file  Social Connections: Not on file     Review of Systems: A 12 point ROS discussed and pertinent positives are indicated in the HPI above.  All other systems are negative.  Review of Systems  Constitutional:  Negative for fatigue and fever.   Respiratory:  Negative for cough and shortness of breath.   Cardiovascular:  Negative for chest pain.  Gastrointestinal:  Negative for abdominal pain.  Musculoskeletal:  Positive for arthralgias (R hip and thigh pain). Negative for back pain.  Psychiatric/Behavioral:  Negative for behavioral problems and confusion.    Vital Signs: BP (!) 168/83 (BP Location: Right Arm)   Pulse 69   Temp 97.8 F (36.6 C) (Oral)   Resp 18   Ht 5\' 6"  (1.676 m)   Wt 117 lb (53.1 kg)   SpO2 97%   BMI 18.88 kg/m   Physical Exam Vitals and nursing note reviewed.  Constitutional:      General: She is not in acute distress.    Appearance: Normal appearance. She is not ill-appearing.  HENT:  Mouth/Throat:     Mouth: Mucous membranes are moist.     Pharynx: Oropharynx is clear.  Cardiovascular:     Rate and Rhythm: Normal rate and regular rhythm.  Pulmonary:     Effort: Pulmonary effort is normal.     Breath sounds: Normal breath sounds.  Skin:    General: Skin is warm and dry.  Neurological:     General: No focal deficit present.     Mental Status: She is alert and oriented to person, place, and time. Mental status is at baseline.  Psychiatric:        Mood and Affect: Mood normal.        Behavior: Behavior normal.        Thought Content: Thought content normal.        Judgment: Judgment normal.     MD Evaluation Airway: WNL Heart: WNL Abdomen: WNL Chest/ Lungs: WNL ASA  Classification: 3 Mallampati/Airway Score: Two   Imaging: No results found.  Labs:  CBC: Recent Labs    12/18/20 0845  WBC 4.6  HGB 13.3  HCT 40.0  PLT 170    COAGS: Recent Labs    12/18/20 0845  INR 1.0    BMP: Recent Labs    12/18/20 0845  NA 139  K 4.8  CL 108  CO2 22  GLUCOSE 99  BUN 28*  CALCIUM 9.7  CREATININE 1.52*  GFRNONAA 34*    LIVER FUNCTION TESTS: No results for input(s): BILITOT, AST, ALT, ALKPHOS, PROT, ALBUMIN in the last 8760 hours.  TUMOR MARKERS: No results  for input(s): AFPTM, CEA, CA199, CHROMGRNA in the last 8760 hours.  Assessment and Plan: Patient with past medical history of HTN, aortic root dilitation presents with complaint of right hip pain, bilateral sacral insufficiency fractures.  IR consulted for sacroplasty. Case reviewed by Dr. Estanislado Pandy who approves patient for procedure.  Patient presents today in their usual state of health.  She has been NPO and is not currently on blood thinners.    Thank you for this interesting consult.  I greatly enjoyed meeting Allison Morris and look forward to participating in their care.  A copy of this report was sent to the requesting provider on this date.  Electronically Signed: Docia Barrier, PA 12/18/2020, 11:07 AM   I spent a total of  30 Minutes   in face to face in clinical consultation, greater than 50% of which was counseling/coordinating care for sacral insufficiency fractures.

## 2020-12-18 NOTE — Sedation Documentation (Signed)
Vital signs stable. 

## 2020-12-18 NOTE — Progress Notes (Signed)
Patient complains of numbness/tingling in right foot.  Patient states numbness started prior to procedure and continues to feel this way.  Writer dopplered dorsalis pedis right foot and heard a strong pulse. Radiology NP notified of patients concerns. Will continue to monitor.

## 2020-12-18 NOTE — Sedation Documentation (Signed)
Report called to [place], RN SS

## 2020-12-18 NOTE — Progress Notes (Signed)
IR called to bedside for bleeding out of left side puncture site. Bleeding had mostly stopped by the time I go to the bedside. Pressure dressing placed with additional observation time of 15-20 min. Patient re-evaluated - bleeding has stopped.   Proceed with patient discharge.  Soyla Dryer, [place] [phone] 12/18/2020, 4:40 PM

## 2020-12-18 NOTE — Sedation Documentation (Signed)
Pt tolerated procedure very well. Pt denies pain at this time, vitals are stable. Pt resting in bed at this time.  Totals: 25 mcg fentanyl 1mg  versed 1g vancomycin 5mg  hydralazine IV post procedure for elevated blood pressure per Dr Estanislado Pandy.

## 2020-12-18 NOTE — Procedures (Signed)
S/P bilateral sacroplasty. S.Mats Jeanlouis MD

## 2020-12-18 NOTE — Discharge Instructions (Addendum)
1.No stooping,or bending or lifting more than 10 lbs for 2 weeks. 2.Use walker to ambulate for 2 weeks 3.No driving for 2 weeks  SACRALPLASTY/VERTEBROPLASTY DISCHARGE INSTRUCTIONS  Medications:      Resume all home medications as before procedure.    Continue your pain medications as prescribed as needed.  Over the next 3-5 days, decrease your pain medication as tolerated.  Over the counter medications (i.e. Tylenol, ibuprofen, and aleve) may be substituted once severe/moderate pain symptoms have subsided.   Wound Care: Bandages may be removed the day following your procedure.  You may get your incision wet once bandages are removed.  Bandaids may be used to cover the incisions until scab formation.  Topical ointments are optional.  If you develop a fever greater than 101 degrees, have increased skin redness at the incision sites or pus-like oozing from incisions occurring within 1 week of the procedure, contact radiology at [phone] or [phone].  Ice pack to back for 15-20 minutes 2-3 time per day for first 2-3 days post procedure.  The ice will expedite muscle healing and help with the pain from the incisions.   Activity: Bedrest today with limited activity for 24 hours post procedure.  No driving for 48 hours.  Increase your activity as tolerated after bedrest (with assistance if necessary).  Refrain from any strenuous activity or heavy lifting (greater than 10 lbs.).   Follow up: Contact radiology at [phone] or [phone] if any questions/concerns.  A physician assistant from radiology will contact you in approximately 1 week.  If a biopsy was performed at the time of your procedure, your referring physician should receive the results in usually 2-3 days.

## 2020-12-18 NOTE — Sedation Documentation (Signed)
Vital signs stable. Procedure started 

## 2021-01-04 DIAGNOSIS — I129 Hypertensive chronic kidney disease with stage 1 through stage 4 chronic kidney disease, or unspecified chronic kidney disease: Secondary | ICD-10-CM | POA: Diagnosis not present

## 2021-01-04 DIAGNOSIS — M1991 Primary osteoarthritis, unspecified site: Secondary | ICD-10-CM | POA: Diagnosis not present

## 2021-01-04 DIAGNOSIS — E063 Autoimmune thyroiditis: Secondary | ICD-10-CM | POA: Diagnosis not present

## 2021-01-04 DIAGNOSIS — N183 Chronic kidney disease, stage 3 unspecified: Secondary | ICD-10-CM | POA: Diagnosis not present

## 2021-01-13 ENCOUNTER — Other Ambulatory Visit (HOSPITAL_COMMUNITY): Payer: Self-pay | Admitting: Interventional Radiology

## 2021-01-13 DIAGNOSIS — M8448XA Pathological fracture, other site, initial encounter for fracture: Secondary | ICD-10-CM

## 2021-01-16 DIAGNOSIS — M1A00X0 Idiopathic chronic gout, unspecified site, without tophus (tophi): Secondary | ICD-10-CM | POA: Diagnosis not present

## 2021-01-16 DIAGNOSIS — M1000 Idiopathic gout, unspecified site: Secondary | ICD-10-CM | POA: Diagnosis not present

## 2021-01-16 DIAGNOSIS — Z6820 Body mass index (BMI) 20.0-20.9, adult: Secondary | ICD-10-CM | POA: Diagnosis not present

## 2021-01-20 ENCOUNTER — Other Ambulatory Visit: Payer: Self-pay

## 2021-01-20 ENCOUNTER — Ambulatory Visit (HOSPITAL_COMMUNITY)
Admission: RE | Admit: 2021-01-20 | Discharge: 2021-01-20 | Disposition: A | Discharge status: Home / Self Care | Payer: Medicare Other | Source: Ambulatory Visit | Attending: Interventional Radiology | Admitting: Interventional Radiology

## 2021-01-20 DIAGNOSIS — M8448XA Pathological fracture, other site, initial encounter for fracture: Secondary | ICD-10-CM

## 2021-01-20 DIAGNOSIS — M25551 Pain in right hip: Secondary | ICD-10-CM | POA: Diagnosis not present

## 2021-01-20 DIAGNOSIS — R202 Paresthesia of skin: Secondary | ICD-10-CM | POA: Diagnosis not present

## 2021-01-21 HISTORY — PX: IR RADIOLOGIST EVAL & MGMT: IMG5224

## 2021-02-16 DIAGNOSIS — N1832 Chronic kidney disease, stage 3b: Secondary | ICD-10-CM | POA: Diagnosis not present

## 2021-02-23 DIAGNOSIS — N2581 Secondary hyperparathyroidism of renal origin: Secondary | ICD-10-CM | POA: Diagnosis not present

## 2021-02-23 DIAGNOSIS — N1832 Chronic kidney disease, stage 3b: Secondary | ICD-10-CM | POA: Diagnosis not present

## 2021-02-23 DIAGNOSIS — I129 Hypertensive chronic kidney disease with stage 1 through stage 4 chronic kidney disease, or unspecified chronic kidney disease: Secondary | ICD-10-CM | POA: Diagnosis not present

## 2021-03-05 DIAGNOSIS — E063 Autoimmune thyroiditis: Secondary | ICD-10-CM | POA: Diagnosis not present

## 2021-03-05 DIAGNOSIS — E041 Nontoxic single thyroid nodule: Secondary | ICD-10-CM | POA: Diagnosis not present

## 2021-03-05 DIAGNOSIS — R600 Localized edema: Secondary | ICD-10-CM | POA: Diagnosis not present

## 2021-03-25 DIAGNOSIS — L570 Actinic keratosis: Secondary | ICD-10-CM | POA: Diagnosis not present

## 2021-03-25 DIAGNOSIS — X32XXXD Exposure to sunlight, subsequent encounter: Secondary | ICD-10-CM | POA: Diagnosis not present

## 2021-04-23 DIAGNOSIS — R600 Localized edema: Secondary | ICD-10-CM | POA: Diagnosis not present

## 2021-04-23 DIAGNOSIS — M10071 Idiopathic gout, right ankle and foot: Secondary | ICD-10-CM | POA: Diagnosis not present

## 2021-04-23 DIAGNOSIS — Z6820 Body mass index (BMI) 20.0-20.9, adult: Secondary | ICD-10-CM | POA: Diagnosis not present

## 2021-04-23 DIAGNOSIS — I10 Essential (primary) hypertension: Secondary | ICD-10-CM | POA: Diagnosis not present

## 2021-05-05 ENCOUNTER — Telehealth: Payer: Medicare Other | Admitting: Cardiology

## 2021-05-05 NOTE — Telephone Encounter (Signed)
Pt c/o medication issue:  1. Name of Medication: nebivolol (BYSTOLIC) 2.5 MG tablet  2. How are you currently taking this medication (dosage and times per day)? AS DIRECTED  3. Are you having a reaction (difficulty breathing--STAT)? HEADACHE FOR 4 DAYS  4. What is your medication issue? PT SAID SHE'S BEEN TAKING THIS MEDICINE FOR   OVER 30 YEARS AND IT GIVES HER A HEADACHE. PT SAID SHE NEED FOR DR MCDOWELL TO WRITE A LETTER TO [employer] STATING THAT SHE CAN ONLY TAKE THE NAME BRAND PRESCRIPTION.

## 2021-05-05 NOTE — Telephone Encounter (Signed)
Pt stated that since she started the generic form of Bystolic 2.5 mg tablets, she had been experiencing headaches. Pt stated that the brand name is not covered under [facility] Formulary List. Pt stopped taking generic medication and headaches stopped. Pt would like Dr. Domenic Polite to write a letter to her insurance company asking she be kept on the brand name medication. I will fwd to provider for input.

## 2021-05-11 ENCOUNTER — Telehealth: Payer: Self-pay

## 2021-05-11 NOTE — Telephone Encounter (Signed)
Prior Authorization for Bystolic 2.5 mg tablets sent to [facility] for intolerance to Nebivolol 2.5 mg tablets.

## 2021-05-11 NOTE — Telephone Encounter (Signed)
Pt notified that PA for Bystolic sent to Insurance. Pt verbalized understanding. Will call pt w/ authorization approval/denial.

## 2021-05-11 NOTE — Telephone Encounter (Signed)
Your request has been approved Effective from 05/11/2021 through 05/11/2022.  Will scan approval letter into pt chart

## 2021-05-18 DIAGNOSIS — I10 Essential (primary) hypertension: Secondary | ICD-10-CM | POA: Diagnosis not present

## 2021-05-18 DIAGNOSIS — M7750 Other enthesopathy of unspecified foot: Secondary | ICD-10-CM | POA: Diagnosis not present

## 2021-05-18 DIAGNOSIS — E063 Autoimmune thyroiditis: Secondary | ICD-10-CM | POA: Diagnosis not present

## 2021-05-18 DIAGNOSIS — L03119 Cellulitis of unspecified part of limb: Secondary | ICD-10-CM | POA: Diagnosis not present

## 2021-06-03 ENCOUNTER — Encounter (HOSPITAL_COMMUNITY): Payer: Self-pay | Admitting: *Deleted

## 2021-06-03 ENCOUNTER — Emergency Department (HOSPITAL_COMMUNITY): Payer: Medicare Other

## 2021-06-03 ENCOUNTER — Emergency Department (HOSPITAL_COMMUNITY)
Admission: EM | Admit: 2021-06-03 | Discharge: 2021-06-03 | Disposition: A | Discharge status: Home / Self Care | Payer: Medicare Other | Attending: Emergency Medicine | Admitting: Emergency Medicine

## 2021-06-03 ENCOUNTER — Other Ambulatory Visit: Payer: Self-pay

## 2021-06-03 DIAGNOSIS — N189 Chronic kidney disease, unspecified: Secondary | ICD-10-CM | POA: Insufficient documentation

## 2021-06-03 DIAGNOSIS — S8991XA Unspecified injury of right lower leg, initial encounter: Secondary | ICD-10-CM | POA: Diagnosis present

## 2021-06-03 DIAGNOSIS — M25561 Pain in right knee: Secondary | ICD-10-CM | POA: Diagnosis not present

## 2021-06-03 DIAGNOSIS — Z7982 Long term (current) use of aspirin: Secondary | ICD-10-CM | POA: Diagnosis not present

## 2021-06-03 DIAGNOSIS — Z8673 Personal history of transient ischemic attack (TIA), and cerebral infarction without residual deficits: Secondary | ICD-10-CM | POA: Diagnosis not present

## 2021-06-03 DIAGNOSIS — I129 Hypertensive chronic kidney disease with stage 1 through stage 4 chronic kidney disease, or unspecified chronic kidney disease: Secondary | ICD-10-CM | POA: Insufficient documentation

## 2021-06-03 DIAGNOSIS — Z7951 Long term (current) use of inhaled steroids: Secondary | ICD-10-CM | POA: Insufficient documentation

## 2021-06-03 DIAGNOSIS — S8391XA Sprain of unspecified site of right knee, initial encounter: Secondary | ICD-10-CM | POA: Diagnosis not present

## 2021-06-03 DIAGNOSIS — Z7902 Long term (current) use of antithrombotics/antiplatelets: Secondary | ICD-10-CM | POA: Diagnosis not present

## 2021-06-03 DIAGNOSIS — Z79899 Other long term (current) drug therapy: Secondary | ICD-10-CM | POA: Insufficient documentation

## 2021-06-03 DIAGNOSIS — W172XXA Fall into hole, initial encounter: Secondary | ICD-10-CM | POA: Insufficient documentation

## 2021-06-03 DIAGNOSIS — S86911A Strain of unspecified muscle(s) and tendon(s) at lower leg level, right leg, initial encounter: Secondary | ICD-10-CM | POA: Diagnosis not present

## 2021-06-03 DIAGNOSIS — I10 Essential (primary) hypertension: Secondary | ICD-10-CM | POA: Diagnosis not present

## 2021-06-03 DIAGNOSIS — M7989 Other specified soft tissue disorders: Secondary | ICD-10-CM | POA: Diagnosis not present

## 2021-06-03 MED ORDER — DEXAMETHASONE SODIUM PHOSPHATE 4 MG/ML IJ SOLN
4.0000 mg | Freq: Once | INTRAMUSCULAR | Status: AC
  Administered 2021-06-03: 23:00:00 4 mg via INTRAMUSCULAR
  Filled 2021-06-03: qty 1

## 2021-06-03 MED ORDER — AMOXICILLIN-POT CLAVULANATE 875-125 MG PO TABS: 1.0000 | ORAL_TABLET | Freq: Two times a day (BID) | ORAL | 0 refills | Status: DC

## 2021-06-03 MED ORDER — HYDROCODONE-ACETAMINOPHEN 5-325 MG PO TABS: 1.0000 | ORAL_TABLET | Freq: Four times a day (QID) | ORAL | 0 refills | Status: DC | PRN

## 2021-06-03 NOTE — ED Triage Notes (Signed)
Pt with right knee pain after stepping into a hole outside 2 days ago. Pt arrived via [facility].  Not able to put any weight today, was able to yesterday.

## 2021-06-03 NOTE — ED Notes (Signed)
Knee Immobilizer applied to right knee, cns intact post application.

## 2021-06-03 NOTE — ED Provider Notes (Signed)
[facility] EMERGENCY DEPARTMENT Provider Note   CSN: [id number] Arrival date & time: 06/03/21  1521     History Chief Complaint  Patient presents with   Knee Pain    Allison Morris is a 85 y.o. female.   Knee Pain  This patient is a very pleasant 85 year old female, she has a history of chronic kidney disease, history of DVT, she has a history of arthritis of her hands and a history of prior TIA, currently on clopidogrel.  She presents to the hospital 2 days after accidentally stepping into a shallow hole with her right leg.  She felt acute onset of pain in her knee, she was able to walk with a cane yesterday but today there was more swelling of the knee and more pain.  Symptoms are persistent, worse with standing, not associated with fevers.  She has now developed a little bit of pain in her left knee as well as her right index finger which has been a little bit red.  She has a known history of gout, she does not take any gout medication at this time.  Past Medical History:  Diagnosis Date   Aortic dilatation ([place])    a. echo 08/2017 showed EF 65-70%, grade 1 DD, mild AI, mildly dilated ascending aorta (64mm), mild MR, mild TR.    Aortic regurgitation    Arthritis    hands   CKD (chronic kidney disease), stage III (HCC)    Essential hypertension    GERD (gastroesophageal reflux disease)    History of DVT (deep vein thrombosis)    Left leg 1997    Hyperlipemia    Mild aortic insufficiency    Mild mitral regurgitation    Mild tricuspid insufficiency    Seasonal allergies     Patient Active Problem List   Diagnosis Date Noted   Mass of finger of right hand 05/30/2019   Osteoarthritis of both hands 05/30/2019   Orthostatic lightheadedness 12/11/2017   TIA (transient ischemic attack) 11/14/2015   Hypertension    Hyperlipemia    History of DVT (deep vein thrombosis)    Aortic dilatation [facility])     Past Surgical History:  Procedure Laterality Date   APPENDECTOMY      CARPAL TUNNEL RELEASE     IR RADIOLOGIST EVAL & MGMT  01/21/2021   IR SACROPLASTY BILATERAL  12/18/2020   MASS EXCISION Right 08/07/2019   Procedure: EXCISION CYST RIGHT INDEX AND RIGHT MIDDLE FINGERS WITH DEBRIDEMENT OF METACARPALPHALANGEAL JOINTS;  Surgeon: Daryll Brod, MD;  Location: [place];  Service: Orthopedics;  Laterality: Right;   TONSILLECTOMY       OB History   No obstetric history on file.     Family History  Problem Relation Age of Onset   Aneurysm Sister    Valvular heart disease Sister    Aneurysm Paternal Grandfather    Heart attack Paternal Grandfather     Social History   Tobacco Use   Smoking status: Never   Smokeless tobacco: Never  Vaping Use   Vaping Use: Never used  Substance Use Topics   Alcohol use: No    Alcohol/week: 0.0 standard drinks   Drug use: No    Home Medications Prior to Admission medications   Medication Sig Start Date End Date Taking? Authorizing Provider  HYDROcodone-acetaminophen (NORCO/VICODIN) 5-325 MG tablet Take 1 tablet by mouth every 6 (six) hours as needed. 06/03/21  Yes Noemi Chapel, MD  acetaminophen (TYLENOL) 500 MG tablet Take 500  mg by mouth every 6 (six) hours as needed for mild pain or moderate pain.     [provider]  aspirin EC 81 MG EC tablet Take 1 tablet (81 mg total) by mouth daily. 11/15/15   Orvan Falconer, MD  Biotin 10000 MCG TABS Take 10,000 mcg by mouth daily.    [provider]  Cholecalciferol (VITAMIN D3) 1000 units CAPS Take 1,000 Units by mouth daily.    [provider]  clobetasol cream (TEMOVATE) 1.61 % Apply 1 application topically daily as needed (for irritation).     [provider]  clopidogrel (PLAVIX) 75 MG tablet Take 1 tablet (75 mg total) by mouth daily. 11/15/15   Orvan Falconer, MD  colchicine 0.6 MG tablet Take 0.6 mg by mouth daily.    [provider]  fexofenadine (ALLEGRA) 180 MG tablet Take 180 mg by mouth daily.    [provider]  Flax Oil-Fish Oil-Borage Oil (FISH-FLAX-BORAGE) CAPS Take 1 capsule by mouth 2 (two) times daily.    [provider]  fluticasone (FLONASE) 50 MCG/ACT nasal spray Place 2 sprays into both nostrils daily as needed for allergies.    [provider]  Glucosamine-Chondroit-Vit C-Mn (GLUCOSAMINE 1500 COMPLEX PO) Take 1 tablet by mouth daily.     [provider]  mupirocin ointment (BACTROBAN) 2 % Apply 1 application topically 2 (two) times daily.    [provider]  nebivolol (BYSTOLIC) 2.5 MG tablet Take 2.5 mg by mouth daily as needed (if bp is over 180/165).    [provider]  pantoprazole (PROTONIX) 40 MG tablet Take 40 mg by mouth daily.     [provider]  Polyethyl Glycol-Propyl Glycol (SYSTANE OP) Place 1 drop into both eyes daily as needed (dry eyes).    [provider]  traMADol (ULTRAM) 50 MG tablet Take 50 mg by mouth 3 (three) times daily as needed for moderate pain. 07/04/20   [provider]  trolamine salicylate (ASPERCREME) 10 % cream Apply 1 application topically as needed for muscle pain.    [provider]  vitamin C (ASCORBIC ACID) 500 MG tablet Take 500 mg by mouth 2 (two) times daily.    [provider]    Allergies    Amlodipine, Biaxin [clarithromycin], Doxycycline, Estrogens, Felodipine, Labetalol, Lisinopril, Losartan, Macrodantin [nitrofurantoin], Statins, Ceftin [cefuroxime], Chlorthalidone, Ciprofloxacin, and Sulfa antibiotics  Review of Systems   Review of Systems  All other systems reviewed and are negative.  Physical Exam Updated Vital Signs BP 127/82    Pulse 76    Temp 98.4 F (36.9 C) (Oral)    Resp 20    Ht 1.676 m (5\' 6" )    Wt 51.3 kg    SpO2 99%    BMI 18.24 kg/m   Physical Exam Vitals and nursing note reviewed.  Constitutional:      General: She is not in acute distress.    Appearance: She is well-developed.  HENT:     Head: Normocephalic and  atraumatic.     Mouth/Throat:     Pharynx: No oropharyngeal exudate.  Eyes:     General: No scleral icterus.       Right eye: No discharge.        Left eye: No discharge.     Conjunctiva/sclera: Conjunctivae normal.     Pupils: Pupils are equal, round, and reactive to light.  Neck:     Thyroid: No thyromegaly.     Vascular: No JVD.  Cardiovascular:     Rate and Rhythm: Normal rate and regular rhythm.     Heart sounds: Normal heart sounds. No murmur heard.   No friction rub. No gallop.  Pulmonary:     Effort: Pulmonary effort is normal. No respiratory distress.     Breath sounds: Normal breath sounds. No wheezing or rales.  Abdominal:     General: Bowel sounds are normal. There is no distension.     Palpations: Abdomen is soft. There is no mass.     Tenderness: There is no abdominal tenderness.  Musculoskeletal:        General: No tenderness. Normal range of motion.     Cervical back: Normal range of motion and neck supple.     Comments: Likely hemarthrosis with joint effusion of the right knee.  She also has a mild effusion of the left knee but no significant tenderness.  She is able to fully range of motion her bilateral ankles and hips.  Upper extremities are normal as well except for the right index finger which has some redness but no tenderness  Lymphadenopathy:     Cervical: No cervical adenopathy.  Skin:    General: Skin is warm and dry.     Findings: Erythema present. No rash.     Comments: Mild erythema over the second digit of the right hand  Neurological:     Mental Status: She is alert.     Coordination: Coordination normal.     Comments: Awake alert and able to follow commands  Psychiatric:        Behavior: Behavior normal.    ED Results / Procedures / Treatments   Labs (all labs ordered are listed, but only abnormal results are displayed) Labs Reviewed - No data to display  EKG None  Radiology DG Knee Complete 4 Views Right  Result Date:  06/03/2021 CLINICAL DATA:  Trauma, recent fall, pain EXAM: RIGHT KNEE - COMPLETE 4+ VIEW COMPARISON:  None. FINDINGS: No recent fracture or dislocation is seen. There is soft tissue fullness anterior to the distal shaft of femur. There are coarse arterial calcifications in the soft tissues. IMPRESSION: No recent fracture or dislocation is seen in right knee. There is soft tissue swelling anterior to the distal shaft of right femur suggesting contusion/hematoma. Electronically Signed   By: Elmer Picker M.D.   On: 06/03/2021 16:54    Procedures Procedures   Medications Ordered in ED Medications  dexamethasone (DECADRON) injection 4 mg (has no administration in time range)    ED Course  I have reviewed the triage vital signs and the nursing notes.  Pertinent labs & imaging results that were available during my care of the patient were reviewed by me and considered in my medical decision making (see chart for details).    MDM Rules/Calculators/A&P                          This patient presents to the ED for concern of knee pain after injury, this involves an extensive number of treatment options, and is a complaint that carries with it a high risk of complications and morbidity.  The differential diagnosis includes hemarthrosis, fracture, dislocation, ligament injury, sprain or strain   Additional history obtained:  Additional history obtained from family member at the bedside External records from outside source obtained and reviewed including prior patellar fracture   Imaging Studies ordered:  I ordered imaging studies including x-ray of the right knee  I independently visualized and interpreted imaging which showed soft tissue swelling, no bony abnormalities of concern I agree with the radiologist interpretation   Medicines ordered and prescription drug management:  I ordered medication including Decadron for pain, possible gout and swelling Reevaluation of the patient  after these medicines showed that the patient improved I have reviewed the patients home medicines and have made adjustments as needed   Critical Interventions:  Evaluation for knee injury X-ray   Problem List / ED Course:  Knee injury on the right, possibly a hemarthrosis as the most likely answer especially given that she is on clopidogrel.  I want her to keep taking this medication.  She will follow-up in the outpatient setting with orthopedics, Dr. Lorin Mercy is her preferred.  She will be given a prescription for a wheelchair Redness and swelling to the right index finger, will give cephalexin in case this is not getting better, she was also given 4 mg of Decadron IM for gout which she preferred instead of oral steroids   Reevaluation:  After the interventions noted above, I reevaluated the patient and found that they have :improved   Dispostion:  After consideration of the diagnostic results and the patients response to treatment feel that the patent would benefit from discharge home, has family member to help her at home..       Final Clinical Impression(s) / ED Diagnoses Final diagnoses:  Knee strain, right, initial encounter    Rx / DC Orders ED Discharge Orders          Ordered    HYDROcodone-acetaminophen (NORCO/VICODIN) 5-325 MG tablet  Every 6 hours PRN        06/03/21 2220             Noemi Chapel, MD 06/03/21 2221

## 2021-06-03 NOTE — Discharge Instructions (Signed)
Your x-rays do not reveal any signs of broken bones.  I would like for you to follow-up with your orthopedist Dr. Lorin Mercy within the week for a recheck and I have given you a wheelchair to help you get around a little bit better.  Emergency department for severe or worsening symptoms

## 2021-06-09 ENCOUNTER — Telehealth: Payer: Self-pay | Admitting: Cardiology

## 2021-06-09 MED ORDER — NEBIVOLOL HCL 2.5 MG PO TABS: 2.5000 mg | ORAL_TABLET | Freq: Every day | ORAL | 2 refills | Status: DC | PRN

## 2021-06-09 NOTE — Telephone Encounter (Signed)
done

## 2021-06-09 NOTE — Telephone Encounter (Signed)
°*  STAT* If patient is at the pharmacy, call can be transferred to refill team.   1. Which medications need to be refilled? (please list name of each medication and dose if known)  nebivolol (BYSTOLIC) 2.5 MG tablet  2. Which pharmacy/location (including street and city if local pharmacy) is medication to be sent to? [place], [address]  3. Do they need a 30 day or 90 day supply? 30 day supply

## 2021-06-12 DIAGNOSIS — I6523 Occlusion and stenosis of bilateral carotid arteries: Secondary | ICD-10-CM | POA: Diagnosis not present

## 2021-06-12 DIAGNOSIS — M810 Age-related osteoporosis without current pathological fracture: Secondary | ICD-10-CM | POA: Diagnosis not present

## 2021-06-12 DIAGNOSIS — M25561 Pain in right knee: Secondary | ICD-10-CM | POA: Diagnosis not present

## 2021-06-12 DIAGNOSIS — I10 Essential (primary) hypertension: Secondary | ICD-10-CM | POA: Diagnosis not present

## 2021-06-19 DIAGNOSIS — M25561 Pain in right knee: Secondary | ICD-10-CM | POA: Diagnosis not present

## 2021-06-19 DIAGNOSIS — M1711 Unilateral primary osteoarthritis, right knee: Secondary | ICD-10-CM | POA: Diagnosis not present

## 2021-06-24 ENCOUNTER — Encounter: Payer: Self-pay | Admitting: Cardiology

## 2021-06-24 ENCOUNTER — Ambulatory Visit: Payer: Medicare Other | Admitting: Cardiology

## 2021-06-24 VITALS — BP 152/80 | HR 64 | Ht 66.0 in | Wt 114.2 lb

## 2021-06-24 DIAGNOSIS — I351 Nonrheumatic aortic (valve) insufficiency: Secondary | ICD-10-CM

## 2021-06-24 DIAGNOSIS — I10 Essential (primary) hypertension: Secondary | ICD-10-CM

## 2021-06-24 NOTE — Progress Notes (Signed)
Cardiology Office Note  Date: 06/24/2021   ID: Allison Morris, DOB [birth year], MRN [id number]  PCP:  Redmond School, MD  Cardiologist:  Rozann Lesches, MD Electrophysiologist:  None   Chief Complaint  Patient presents with   Cardiac follow-up    History of Present Illness: Allison Morris is an 86 y.o. female last seen in July 2022.  She is here for a follow-up visit.  She tells me that her husband just recently passed away with lung cancer after a fairly sudden diagnosis, but dwindling health.  She is holding up reasonably well, does have support from family.  She has had numerous medication intolerances including Norvasc, Plendil, ACE inhibitor's and ARB's, chlorthalidone, spironolactone, and beta-blockers with the exception of Bystolic.  We reviewed her home blood pressure checks, she does use her Bystolic more on an as-needed basis than standing depending on blood pressure measurements.  She also continues to follow at [facility].  Past Medical History:  Diagnosis Date   Aortic dilatation ([place])    a. echo 08/2017 showed EF 65-70%, grade 1 DD, mild AI, mildly dilated ascending aorta (69mm), mild MR, mild TR.    Aortic regurgitation    Arthritis    hands   CKD (chronic kidney disease), stage III (HCC)    Essential hypertension    GERD (gastroesophageal reflux disease)    History of DVT (deep vein thrombosis)    Left leg 1997    Hyperlipemia    Mild aortic insufficiency    Mild mitral regurgitation    Mild tricuspid insufficiency    Seasonal allergies     Past Surgical History:  Procedure Laterality Date   APPENDECTOMY     CARPAL TUNNEL RELEASE     IR RADIOLOGIST EVAL & MGMT  01/21/2021   IR SACROPLASTY BILATERAL  12/18/2020   MASS EXCISION Right 08/07/2019   Procedure: EXCISION CYST RIGHT INDEX AND RIGHT MIDDLE FINGERS WITH DEBRIDEMENT OF METACARPALPHALANGEAL JOINTS;  Surgeon: Daryll Brod, MD;  Location: [place];  Service: Orthopedics;  Laterality:  Right;   TONSILLECTOMY      Current Outpatient Medications  Medication Sig Dispense Refill   acetaminophen (TYLENOL) 500 MG tablet Take 500 mg by mouth every 6 (six) hours as needed for mild pain or moderate pain.      aspirin EC 81 MG EC tablet Take 1 tablet (81 mg total) by mouth daily. 100 tablet 1   Biotin 10000 MCG TABS Take 10,000 mcg by mouth daily.     Cholecalciferol (VITAMIN D3) 1000 units CAPS Take 1,000 Units by mouth daily.     clobetasol cream (TEMOVATE) 1.49 % Apply 1 application topically daily as needed (for irritation).      clopidogrel (PLAVIX) 75 MG tablet Take 1 tablet (75 mg total) by mouth daily. 30 tablet 2   colchicine 0.6 MG tablet Take 0.6 mg by mouth daily.     fexofenadine (ALLEGRA) 180 MG tablet Take 180 mg by mouth daily.     Flax Oil-Fish Oil-Borage Oil (FISH-FLAX-BORAGE) CAPS Take 1 capsule by mouth 2 (two) times daily.     fluticasone (FLONASE) 50 MCG/ACT nasal spray Place 2 sprays into both nostrils daily as needed for allergies.     Glucosamine-Chondroit-Vit C-Mn (GLUCOSAMINE 1500 COMPLEX PO) Take 1 tablet by mouth daily.      HYDROcodone-acetaminophen (NORCO/VICODIN) 5-325 MG tablet Take 1 tablet by mouth every 6 (six) hours as needed. 6 tablet 0   nebivolol (BYSTOLIC) 2.5 MG tablet Take 1  tablet (2.5 mg total) by mouth daily as needed (if bp is over 180/165). 30 tablet 2   pantoprazole (PROTONIX) 40 MG tablet Take 40 mg by mouth daily.      Polyethyl Glycol-Propyl Glycol (SYSTANE OP) Place 1 drop into both eyes daily as needed (dry eyes).     traMADol (ULTRAM) 50 MG tablet Take 50 mg by mouth 3 (three) times daily as needed for moderate pain.     trolamine salicylate (ASPERCREME) 10 % cream Apply 1 application topically as needed for muscle pain.     vitamin C (ASCORBIC ACID) 500 MG tablet Take 500 mg by mouth 2 (two) times daily.     No current facility-administered medications for this visit.   Allergies:  Amlodipine, Biaxin [clarithromycin],  Doxycycline, Estrogens, Felodipine, Labetalol, Lisinopril, Losartan, Macrodantin [nitrofurantoin], Statins, Ceftin [cefuroxime], Chlorthalidone, Ciprofloxacin, and Sulfa antibiotics   ROS: No palpitations or syncope.  Physical Exam: VS:  BP (!) 152/80    Pulse 64    Ht 5\' 6"  (1.676 m)    Wt 114 lb 3.2 oz (51.8 kg)    SpO2 99%    BMI 18.43 kg/m , BMI Body mass index is 18.43 kg/m.  Wt Readings from Last 3 Encounters:  06/24/21 114 lb 3.2 oz (51.8 kg)  06/03/21 113 lb (51.3 kg)  12/18/20 117 lb (53.1 kg)    General: Patient appears comfortable at rest. HEENT: Conjunctiva and lids normal, wearing a mask. Neck: Supple, no elevated JVP or carotid bruits, no thyromegaly. Lungs: Clear to auscultation, nonlabored breathing at rest. Cardiac: Regular rate and rhythm, no S3, 1/6 systolic murmur, no pericardial rub. Extremities: No pitting edema.  ECG:  An ECG dated 12/16/2020 was personally reviewed today and demonstrated:  Sinus rhythm with nonspecific T wave changes.  Recent Labwork: 12/18/2020: BUN 28; Creatinine, Ser 1.52; Hemoglobin 13.3; Platelets 170; Potassium 4.8; Sodium 139     Component Value Date/Time   CHOL 248 (H) 11/15/2015 0542   TRIG 87 11/15/2015 0542   HDL 48 11/15/2015 0542   CHOLHDL 5.2 11/15/2015 0542   VLDL 17 11/15/2015 0542   LDLCALC 183 (H) 11/15/2015 0542    Other Studies Reviewed Today:  Echocardiogram 11/09/2018:  1. The left ventricle has hyperdynamic systolic function, with an ejection fraction of >65%. The cavity size was normal. Left ventricular diastolic Doppler parameters are consistent with impaired relaxation. No evidence of left ventricular regional wall  motion abnormalities.  2. The right ventricle has normal systolic function. The cavity was normal. There is no increase in right ventricular wall thickness.  3. The aortic valve is tricuspid. Mild calcification of the aortic valve. Aortic valve regurgitation is mild by color flow Doppler. Mild aortic  annular calcification noted.  4. The mitral valve is grossly normal. There is mild mitral annular calcification present.  5. The tricuspid valve is grossly normal.  6. The aortic root is normal in size and structure.  7. There is moderate dilatation of the ascending aorta measuring 44 mm.   Lexiscan Myoview 12/29/2017: No diagnostic ST segment changes to indicate ischemia. Small, mild intensity, apical anterior defect that is fixed and consistent with breast attenuation. No significant ischemic defects noted. This is a low risk study. Nuclear stress EF: 81%.  Assessment and Plan:  1.  Essential hypertension.  Plan to continue Bystolic, she tracks blood pressure daily.  2.  Moderate aortic root dilatation with mild aortic regurgitation, echocardiogram ordered for next visit in 6 months.  No change on examination.  She remains asymptomatic.  Medication Adjustments/Labs and Tests Ordered: Current medicines are reviewed at length with the patient today.  Concerns regarding medicines are outlined above.   Tests Ordered: Orders Placed This Encounter  Procedures   ECHOCARDIOGRAM COMPLETE    Medication Changes: No orders of the defined types were placed in this encounter.   Disposition:  Follow up  6 months.  Signed, Satira Sark, MD, [facility] 06/24/2021 4:45 PM    [facility] [place], [place], [place] [address] Phone: [phone]; Fax: [phone]

## 2021-06-24 NOTE — Patient Instructions (Addendum)

## 2021-07-10 DIAGNOSIS — I10 Essential (primary) hypertension: Secondary | ICD-10-CM | POA: Diagnosis not present

## 2021-07-10 DIAGNOSIS — Z029 Encounter for administrative examinations, unspecified: Secondary | ICD-10-CM | POA: Diagnosis not present

## 2021-07-10 DIAGNOSIS — I6523 Occlusion and stenosis of bilateral carotid arteries: Secondary | ICD-10-CM | POA: Diagnosis not present

## 2021-07-10 DIAGNOSIS — Z0000 Encounter for general adult medical examination without abnormal findings: Secondary | ICD-10-CM | POA: Diagnosis not present

## 2021-07-10 DIAGNOSIS — M25561 Pain in right knee: Secondary | ICD-10-CM | POA: Diagnosis not present

## 2021-07-16 DIAGNOSIS — Z681 Body mass index (BMI) 19 or less, adult: Secondary | ICD-10-CM | POA: Diagnosis not present

## 2021-07-16 DIAGNOSIS — M25552 Pain in left hip: Secondary | ICD-10-CM | POA: Diagnosis not present

## 2021-07-16 DIAGNOSIS — M1712 Unilateral primary osteoarthritis, left knee: Secondary | ICD-10-CM | POA: Diagnosis not present

## 2021-07-16 DIAGNOSIS — B370 Candidal stomatitis: Secondary | ICD-10-CM | POA: Diagnosis not present

## 2021-07-20 ENCOUNTER — Other Ambulatory Visit: Payer: Self-pay

## 2021-07-20 ENCOUNTER — Inpatient Hospital Stay (HOSPITAL_COMMUNITY)
Admission: EM | Admit: 2021-07-20 | Discharge: 2021-07-22 | DRG: 871 | Disposition: A | Discharge status: Home / Self Care | Payer: Medicare Other | Attending: Internal Medicine | Admitting: Internal Medicine

## 2021-07-20 ENCOUNTER — Encounter (HOSPITAL_COMMUNITY): Payer: Self-pay | Admitting: Emergency Medicine

## 2021-07-20 ENCOUNTER — Ambulatory Visit: Payer: Self-pay

## 2021-07-20 ENCOUNTER — Ambulatory Visit
Admission: EM | Admit: 2021-07-20 | Discharge: 2021-07-20 | Disposition: A | Discharge status: Other Acute Inpatient Hospital | Payer: Medicare Other

## 2021-07-20 ENCOUNTER — Emergency Department (HOSPITAL_COMMUNITY): Payer: Medicare Other

## 2021-07-20 DIAGNOSIS — Z79899 Other long term (current) drug therapy: Secondary | ICD-10-CM | POA: Diagnosis not present

## 2021-07-20 DIAGNOSIS — I083 Combined rheumatic disorders of mitral, aortic and tricuspid valves: Secondary | ICD-10-CM | POA: Diagnosis not present

## 2021-07-20 DIAGNOSIS — R000 Tachycardia, unspecified: Secondary | ICD-10-CM | POA: Diagnosis not present

## 2021-07-20 DIAGNOSIS — N1832 Chronic kidney disease, stage 3b: Secondary | ICD-10-CM | POA: Diagnosis not present

## 2021-07-20 DIAGNOSIS — E785 Hyperlipidemia, unspecified: Secondary | ICD-10-CM | POA: Diagnosis present

## 2021-07-20 DIAGNOSIS — Z86718 Personal history of other venous thrombosis and embolism: Secondary | ICD-10-CM

## 2021-07-20 DIAGNOSIS — Z7902 Long term (current) use of antithrombotics/antiplatelets: Secondary | ICD-10-CM | POA: Diagnosis not present

## 2021-07-20 DIAGNOSIS — Z20822 Contact with and (suspected) exposure to covid-19: Secondary | ICD-10-CM | POA: Diagnosis not present

## 2021-07-20 DIAGNOSIS — A419 Sepsis, unspecified organism: Secondary | ICD-10-CM | POA: Diagnosis not present

## 2021-07-20 DIAGNOSIS — R0602 Shortness of breath: Secondary | ICD-10-CM | POA: Diagnosis present

## 2021-07-20 DIAGNOSIS — M19042 Primary osteoarthritis, left hand: Secondary | ICD-10-CM | POA: Diagnosis not present

## 2021-07-20 DIAGNOSIS — J9601 Acute respiratory failure with hypoxia: Secondary | ICD-10-CM | POA: Diagnosis not present

## 2021-07-20 DIAGNOSIS — Z8249 Family history of ischemic heart disease and other diseases of the circulatory system: Secondary | ICD-10-CM | POA: Diagnosis not present

## 2021-07-20 DIAGNOSIS — Z882 Allergy status to sulfonamides status: Secondary | ICD-10-CM | POA: Diagnosis not present

## 2021-07-20 DIAGNOSIS — I129 Hypertensive chronic kidney disease with stage 1 through stage 4 chronic kidney disease, or unspecified chronic kidney disease: Secondary | ICD-10-CM | POA: Diagnosis present

## 2021-07-20 DIAGNOSIS — Z881 Allergy status to other antibiotic agents status: Secondary | ICD-10-CM | POA: Diagnosis not present

## 2021-07-20 DIAGNOSIS — K219 Gastro-esophageal reflux disease without esophagitis: Secondary | ICD-10-CM | POA: Diagnosis not present

## 2021-07-20 DIAGNOSIS — J189 Pneumonia, unspecified organism: Secondary | ICD-10-CM | POA: Diagnosis not present

## 2021-07-20 DIAGNOSIS — R058 Other specified cough: Secondary | ICD-10-CM

## 2021-07-20 DIAGNOSIS — Z7982 Long term (current) use of aspirin: Secondary | ICD-10-CM | POA: Diagnosis not present

## 2021-07-20 DIAGNOSIS — I10 Essential (primary) hypertension: Secondary | ICD-10-CM | POA: Diagnosis present

## 2021-07-20 DIAGNOSIS — R0902 Hypoxemia: Secondary | ICD-10-CM

## 2021-07-20 DIAGNOSIS — J168 Pneumonia due to other specified infectious organisms: Secondary | ICD-10-CM | POA: Diagnosis not present

## 2021-07-20 DIAGNOSIS — J159 Unspecified bacterial pneumonia: Secondary | ICD-10-CM | POA: Diagnosis not present

## 2021-07-20 DIAGNOSIS — R0789 Other chest pain: Secondary | ICD-10-CM

## 2021-07-20 DIAGNOSIS — Z888 Allergy status to other drugs, medicaments and biological substances status: Secondary | ICD-10-CM

## 2021-07-20 DIAGNOSIS — M19041 Primary osteoarthritis, right hand: Secondary | ICD-10-CM | POA: Diagnosis present

## 2021-07-20 DIAGNOSIS — R918 Other nonspecific abnormal finding of lung field: Secondary | ICD-10-CM | POA: Diagnosis not present

## 2021-07-20 LAB — CBC WITH DIFFERENTIAL/PLATELET
Abs Immature Granulocytes: 1.67 10*3/uL — ABNORMAL HIGH (ref 0.00–0.07)
Basophils Absolute: 0.1 10*3/uL (ref 0.0–0.1)
Basophils Relative: 0 %
Eosinophils Absolute: 0 10*3/uL (ref 0.0–0.5)
Eosinophils Relative: 0 %
HCT: 36.5 % (ref 36.0–46.0)
Hemoglobin: 12.3 g/dL (ref 12.0–15.0)
Immature Granulocytes: 5 %
Lymphocytes Relative: 1 %
Lymphs Abs: 0.4 10*3/uL — ABNORMAL LOW (ref 0.7–4.0)
MCH: 35.1 pg — ABNORMAL HIGH (ref 26.0–34.0)
MCHC: 33.7 g/dL (ref 30.0–36.0)
MCV: 104.3 fL — ABNORMAL HIGH (ref 80.0–100.0)
Monocytes Absolute: 1.1 10*3/uL — ABNORMAL HIGH (ref 0.1–1.0)
Monocytes Relative: 4 %
Neutro Abs: 27.7 10*3/uL — ABNORMAL HIGH (ref 1.7–7.7)
Neutrophils Relative %: 90 %
Platelets: 239 10*3/uL (ref 150–400)
RBC: 3.5 MIL/uL — ABNORMAL LOW (ref 3.87–5.11)
RDW: 12.8 % (ref 11.5–15.5)
WBC: 31 10*3/uL — ABNORMAL HIGH (ref 4.0–10.5)
nRBC: 0 % (ref 0.0–0.2)

## 2021-07-20 LAB — COMPREHENSIVE METABOLIC PANEL
ALT: 18 U/L (ref 0–44)
AST: 19 U/L (ref 15–41)
Albumin: 3 g/dL — ABNORMAL LOW (ref 3.5–5.0)
Alkaline Phosphatase: 114 U/L (ref 38–126)
Anion gap: 12 (ref 5–15)
BUN: 39 mg/dL — ABNORMAL HIGH (ref 8–23)
CO2: 19 mmol/L — ABNORMAL LOW (ref 22–32)
Calcium: 9.4 mg/dL (ref 8.9–10.3)
Chloride: 101 mmol/L (ref 98–111)
Creatinine, Ser: 1.71 mg/dL — ABNORMAL HIGH (ref 0.44–1.00)
GFR, Estimated: 29 mL/min — ABNORMAL LOW (ref >60)
Glucose, Bld: 84 mg/dL (ref 70–99)
Potassium: 4.2 mmol/L (ref 3.5–5.1)
Sodium: 132 mmol/L — ABNORMAL LOW (ref 135–145)
Total Bilirubin: 2.2 mg/dL — ABNORMAL HIGH (ref 0.3–1.2)
Total Protein: 6.5 g/dL (ref 6.5–8.1)

## 2021-07-20 LAB — RESP PANEL BY RT-PCR (FLU A&B, COVID) ARPGX2
Influenza A by PCR: NEGATIVE
Influenza B by PCR: NEGATIVE
SARS Coronavirus 2 by RT PCR: NEGATIVE

## 2021-07-20 LAB — LACTIC ACID, PLASMA
Lactic Acid, Venous: 1.7 mmol/L (ref 0.5–1.9)
Lactic Acid, Venous: 1.6 mmol/L (ref 0.5–1.9)

## 2021-07-20 LAB — BRAIN NATRIURETIC PEPTIDE: B Natriuretic Peptide: 354 pg/mL — ABNORMAL HIGH (ref 0.0–100.0)

## 2021-07-20 LAB — APTT: aPTT: 32 seconds (ref 24–36)

## 2021-07-20 LAB — PROTIME-INR
INR: 1.4 — ABNORMAL HIGH (ref 0.8–1.2)
Prothrombin Time: 16.8 seconds — ABNORMAL HIGH (ref 11.4–15.2)

## 2021-07-20 MED ORDER — SODIUM CHLORIDE 0.9 % IV SOLN
2.0000 g | INTRAVENOUS | Status: DC
  Administered 2021-07-21 – 2021-07-22 (×2): 2 g via INTRAVENOUS
  Filled 2021-07-20 (×2): qty 20

## 2021-07-20 MED ORDER — CLOPIDOGREL BISULFATE 75 MG PO TABS
75.0000 mg | ORAL_TABLET | Freq: Every day | ORAL | Status: DC
  Administered 2021-07-21 – 2021-07-22 (×2): 75 mg via ORAL
  Filled 2021-07-20 (×2): qty 1

## 2021-07-20 MED ORDER — ACETAMINOPHEN 325 MG PO TABS
650.0000 mg | ORAL_TABLET | Freq: Four times a day (QID) | ORAL | Status: DC | PRN
  Administered 2021-07-20: 650 mg via ORAL
  Filled 2021-07-20: qty 2

## 2021-07-20 MED ORDER — IPRATROPIUM-ALBUTEROL 0.5-2.5 (3) MG/3ML IN SOLN
3.0000 mL | Freq: Three times a day (TID) | RESPIRATORY_TRACT | Status: DC
  Administered 2021-07-20 – 2021-07-22 (×5): 3 mL via RESPIRATORY_TRACT
  Filled 2021-07-20 (×6): qty 3

## 2021-07-20 MED ORDER — NEBIVOLOL HCL 2.5 MG PO TABS
2.5000 mg | ORAL_TABLET | Freq: Every day | ORAL | Status: DC | PRN
  Administered 2021-07-22: 2.5 mg via ORAL
  Filled 2021-07-20 (×2): qty 1

## 2021-07-20 MED ORDER — LACTATED RINGERS IV BOLUS
500.0000 mL | Freq: Once | INTRAVENOUS | Status: AC
  Administered 2021-07-20: 500 mL via INTRAVENOUS

## 2021-07-20 MED ORDER — PANTOPRAZOLE SODIUM 40 MG PO TBEC
40.0000 mg | DELAYED_RELEASE_TABLET | Freq: Every day | ORAL | Status: DC
  Administered 2021-07-20 – 2021-07-22 (×3): 40 mg via ORAL
  Filled 2021-07-20 (×3): qty 1

## 2021-07-20 MED ORDER — HEPARIN SODIUM (PORCINE) 5000 UNIT/ML IJ SOLN
5000.0000 [IU] | Freq: Three times a day (TID) | INTRAMUSCULAR | Status: DC
  Administered 2021-07-20 – 2021-07-22 (×6): 5000 [IU] via SUBCUTANEOUS
  Filled 2021-07-20 (×6): qty 1

## 2021-07-20 MED ORDER — AZITHROMYCIN 250 MG PO TABS
500.0000 mg | ORAL_TABLET | Freq: Every day | ORAL | Status: DC
  Administered 2021-07-21 – 2021-07-22 (×2): 500 mg via ORAL
  Filled 2021-07-20 (×2): qty 2

## 2021-07-20 MED ORDER — ASPIRIN EC 81 MG PO TBEC
81.0000 mg | DELAYED_RELEASE_TABLET | Freq: Every day | ORAL | Status: DC
  Administered 2021-07-21 – 2021-07-22 (×2): 81 mg via ORAL
  Filled 2021-07-20 (×2): qty 1

## 2021-07-20 MED ORDER — LACTATED RINGERS IV BOLUS (SEPSIS)
500.0000 mL | Freq: Once | INTRAVENOUS | Status: AC
  Administered 2021-07-20: 500 mL via INTRAVENOUS

## 2021-07-20 MED ORDER — SODIUM CHLORIDE 0.9% FLUSH
3.0000 mL | Freq: Two times a day (BID) | INTRAVENOUS | Status: DC
  Administered 2021-07-20 – 2021-07-22 (×4): 3 mL via INTRAVENOUS

## 2021-07-20 MED ORDER — SODIUM CHLORIDE 0.9 % IV SOLN
500.0000 mg | Freq: Once | INTRAVENOUS | Status: AC
  Administered 2021-07-20: 500 mg via INTRAVENOUS
  Filled 2021-07-20: qty 5

## 2021-07-20 MED ORDER — SODIUM CHLORIDE 0.9 % IV SOLN: 500.0000 mg | INTRAVENOUS | Status: DC

## 2021-07-20 MED ORDER — SODIUM CHLORIDE 0.9 % IV SOLN: INTRAVENOUS | Status: DC

## 2021-07-20 MED ORDER — TRAMADOL HCL 50 MG PO TABS: 50.0000 mg | ORAL_TABLET | Freq: Three times a day (TID) | ORAL | Status: DC | PRN

## 2021-07-20 MED ORDER — LACTATED RINGERS IV BOLUS (SEPSIS): 1000.0000 mL | Freq: Once | INTRAVENOUS | Status: DC

## 2021-07-20 MED ORDER — SODIUM CHLORIDE 0.9 % IV SOLN
1.0000 g | Freq: Once | INTRAVENOUS | Status: AC
  Administered 2021-07-20: 1 g via INTRAVENOUS
  Filled 2021-07-20: qty 10

## 2021-07-20 MED ORDER — COLCHICINE 0.6 MG PO TABS
0.6000 mg | ORAL_TABLET | Freq: Every day | ORAL | Status: DC
  Administered 2021-07-21 – 2021-07-22 (×2): 0.6 mg via ORAL
  Filled 2021-07-20 (×2): qty 1

## 2021-07-20 MED ORDER — ENOXAPARIN SODIUM 40 MG/0.4ML IJ SOSY: 40.0000 mg | PREFILLED_SYRINGE | INTRAMUSCULAR | Status: DC

## 2021-07-20 NOTE — Assessment & Plan Note (Addendum)
-  Continue Bystolic  -continue heart healthy diet.

## 2021-07-20 NOTE — Progress Notes (Signed)
Antibiotics given @ 1322, code sepsis called @ 1458

## 2021-07-20 NOTE — ED Triage Notes (Signed)
Patient is being discharged from the Urgent Care and sent to the Emergency Department via ems . Per M. Mani, patient is in need of higher level of care due to sob, hypoxia, hypotension. Patient is aware and verbalizes understanding of plan of care.  Vitals:   07/20/21 1116  BP: 119/70  Pulse: (!) 122  Resp: (!) 24  Temp: 98.4 F (36.9 C)  SpO2: (!) 88%

## 2021-07-20 NOTE — ED Provider Notes (Signed)
[facility] EMERGENCY DEPARTMENT Provider Note   CSN: [id number] Arrival date & time: 07/20/21  1150     History  Chief Complaint  Patient presents with   Shortness of Breath    Allison Morris is a 86 y.o. female.   Shortness of Breath Associated symptoms: cough and fever   Patient presents for shortness of breath.  Medical history includes TIA, HTN, HLD, history of DVT, and CKD.  Prior to arrival in the ED, patient was seen in urgent care.  At that time, she endorsed 3 days of worsening shortness of breath, productive cough, and fever.  She was found be tachycardic and hypoxic.  She was placed on 4 L of supplemental oxygen.  At baseline, she does not use supplemental oxygen.  Most recent fever was yesterday.  Her sputum is described as green in color.    Home Medications Prior to Admission medications   Medication Sig Start Date End Date Taking? Authorizing Provider  acetaminophen (TYLENOL) 500 MG tablet Take 500 mg by mouth every 6 (six) hours as needed for mild pain or moderate pain.     [provider]  aspirin EC 81 MG EC tablet Take 1 tablet (81 mg total) by mouth daily. 11/15/15   Orvan Falconer, MD  Biotin 10000 MCG TABS Take 10,000 mcg by mouth daily.    [provider]  Cholecalciferol (VITAMIN D3) 1000 units CAPS Take 1,000 Units by mouth daily.    [provider]  clobetasol cream (TEMOVATE) 2.70 % Apply 1 application topically daily as needed (for irritation).     [provider]  clopidogrel (PLAVIX) 75 MG tablet Take 1 tablet (75 mg total) by mouth daily. 11/15/15   Orvan Falconer, MD  colchicine 0.6 MG tablet Take 0.6 mg by mouth daily.    [provider]  fexofenadine (ALLEGRA) 180 MG tablet Take 180 mg by mouth daily.    [provider]  Flax Oil-Fish Oil-Borage Oil (FISH-FLAX-BORAGE) CAPS Take 1 capsule by mouth 2 (two) times daily.    [provider]  fluticasone (FLONASE) 50 MCG/ACT nasal spray Place 2 sprays  into both nostrils daily as needed for allergies.    [provider]  Glucosamine-Chondroit-Vit C-Mn (GLUCOSAMINE 1500 COMPLEX PO) Take 1 tablet by mouth daily.     [provider]  HYDROcodone-acetaminophen (NORCO/VICODIN) 5-325 MG tablet Take 1 tablet by mouth every 6 (six) hours as needed. 06/03/21   Noemi Chapel, MD  nebivolol (BYSTOLIC) 2.5 MG tablet Take 1 tablet (2.5 mg total) by mouth daily as needed (if bp is over 180/165). 06/09/21   Satira Sark, MD  pantoprazole (PROTONIX) 40 MG tablet Take 40 mg by mouth daily.     [provider]  Polyethyl Glycol-Propyl Glycol (SYSTANE OP) Place 1 drop into both eyes daily as needed (dry eyes).    [provider]  traMADol (ULTRAM) 50 MG tablet Take 50 mg by mouth 3 (three) times daily as needed for moderate pain. 07/04/20   [provider]  trolamine salicylate (ASPERCREME) 10 % cream Apply 1 application topically as needed for muscle pain.    [provider]  vitamin C (ASCORBIC ACID) 500 MG tablet Take 500 mg by mouth 2 (two) times daily.    [provider]      Allergies    Amlodipine, Biaxin [clarithromycin], Doxycycline, Estrogens, Felodipine, Labetalol, Lisinopril, Losartan, Macrodantin [nitrofurantoin], Statins, Ceftin [cefuroxime], Chlorthalidone, Ciprofloxacin, and Sulfa antibiotics    Review of Systems  Review of Systems  Constitutional:  Positive for appetite change, fatigue and fever.  Respiratory:  Positive for cough and shortness of breath.   All other systems reviewed and are negative.  Physical Exam Updated Vital Signs BP (!) 132/56 (BP Location: Left Arm)    Pulse 100    Temp (!) 101.4 F (38.6 C) (Oral)    Resp 20    Ht 5\' 6"  (1.676 m)    Wt 49.9 kg    SpO2 97%    BMI 17.75 kg/m  Physical Exam Vitals and nursing note reviewed.  Constitutional:      General: She is not in acute distress.    Appearance: She is well-developed. She is not ill-appearing,  toxic-appearing or diaphoretic.     Interventions: Nasal cannula in place.  HENT:     Head: Normocephalic and atraumatic.     Mouth/Throat:     Mouth: Mucous membranes are moist.     Pharynx: Oropharynx is clear.  Eyes:     Conjunctiva/sclera: Conjunctivae normal.  Cardiovascular:     Rate and Rhythm: Normal rate and regular rhythm.     Heart sounds: No murmur heard. Pulmonary:     Effort: Pulmonary effort is normal. No accessory muscle usage or respiratory distress.     Breath sounds: No decreased breath sounds, wheezing or rhonchi.  Chest:     Chest wall: No tenderness.  Abdominal:     Palpations: Abdomen is soft.     Tenderness: There is no abdominal tenderness.  Musculoskeletal:        General: No swelling.     Cervical back: Normal range of motion and neck supple.     Right lower leg: No edema.     Left lower leg: No edema.  Skin:    General: Skin is warm and dry.     Capillary Refill: Capillary refill takes less than 2 seconds.  Neurological:     General: No focal deficit present.     Mental Status: She is alert and oriented to person, place, and time.     Cranial Nerves: No cranial nerve deficit.     Motor: No weakness.  Psychiatric:        Mood and Affect: Mood normal.        Behavior: Behavior normal.    ED Results / Procedures / Treatments   Labs (all labs ordered are listed, but only abnormal results are displayed) Labs Reviewed  CBC WITH DIFFERENTIAL/PLATELET - Abnormal; Notable for the following components:      Result Value   WBC 31.0 (*)    RBC 3.50 (*)    MCV 104.3 (*)    MCH 35.1 (*)    Neutro Abs 27.7 (*)    Lymphs Abs 0.4 (*)    Monocytes Absolute 1.1 (*)    Abs Immature Granulocytes 1.67 (*)    All other components within normal limits  COMPREHENSIVE METABOLIC PANEL - Abnormal; Notable for the following components:   Sodium 132 (*)    CO2 19 (*)    BUN 39 (*)    Creatinine, Ser 1.71 (*)    Albumin 3.0 (*)    Total Bilirubin 2.2 (*)     GFR, Estimated 29 (*)    All other components within normal limits  BRAIN NATRIURETIC PEPTIDE - Abnormal; Notable for the following components:   B Natriuretic Peptide 354.0 (*)    All other components within normal limits  PROTIME-INR - Abnormal; Notable for the following components:  Prothrombin Time 16.8 (*)    INR 1.4 (*)    All other components within normal limits  RESP PANEL BY RT-PCR (FLU A&B, COVID) ARPGX2  CULTURE, BLOOD (ROUTINE X 2)  CULTURE, BLOOD (ROUTINE X 2)  EXPECTORATED SPUTUM ASSESSMENT W GRAM STAIN, RFLX TO RESP C  LACTIC ACID, PLASMA  LACTIC ACID, PLASMA  APTT  CBC  BASIC METABOLIC PANEL    EKG None  Radiology DG Chest Port 1 View  Result Date: 07/20/2021 CLINICAL DATA:  Cough.  Shortness of breath. EXAM: PORTABLE CHEST 1 VIEW COMPARISON:  Chest two views 06/05/2020 FINDINGS: Cardiac silhouette and mediastinal contours are within normal limits with calcification again seen within the aortic arch. There is new heterogeneous airspace opacification overlying the lateral left lower lung. Minimal new airspace density is also seen within the inferolateral right upper lobe abutting the right minor fissure. No definite pleural effusion. No pneumothorax. Diffuse decreased bone mineralization with multilevel degenerative disc changes of the thoracic spine. IMPRESSION: New left basilar and minimal inferolateral right upper lobe airspace opacification suspicious for pneumonia. Electronically Signed   By: Yvonne Kendall M.D.   On: 07/20/2021 12:45    Procedures Procedures    Medications Ordered in ED Medications  aspirin EC tablet 81 mg (has no administration in time range)  colchicine tablet 0.6 mg (has no administration in time range)  traMADol (ULTRAM) tablet 50 mg (has no administration in time range)  nebivolol (BYSTOLIC) tablet 2.5 mg (has no administration in time range)  pantoprazole (PROTONIX) EC tablet 40 mg (has no administration in time range)  clopidogrel  (PLAVIX) tablet 75 mg (has no administration in time range)  cefTRIAXone (ROCEPHIN) 2 g in sodium chloride 0.9 % 100 mL IVPB (has no administration in time range)  azithromycin (ZITHROMAX) 500 mg in sodium chloride 0.9 % 250 mL IVPB (has no administration in time range)  enoxaparin (LOVENOX) injection 40 mg (has no administration in time range)  sodium chloride flush (NS) 0.9 % injection 3 mL (has no administration in time range)  lactated ringers bolus 1,000 mL (1,000 mLs Intravenous Not Given 07/20/21 1828)    And  lactated ringers bolus 500 mL (500 mLs Intravenous New Bag/Given 07/20/21 1550)  0.9 %  sodium chloride infusion ( Intravenous New Bag/Given 07/20/21 1549)  ipratropium-albuterol (DUONEB) 0.5-2.5 (3) MG/3ML nebulizer solution 3 mL (has no administration in time range)  acetaminophen (TYLENOL) tablet 650 mg (650 mg Oral Given 07/20/21 1853)  cefTRIAXone (ROCEPHIN) 1 g in sodium chloride 0.9 % 100 mL IVPB (0 g Intravenous Stopped 07/20/21 1359)  azithromycin (ZITHROMAX) 500 mg in sodium chloride 0.9 % 250 mL IVPB (500 mg Intravenous New Bag/Given 07/20/21 1403)  lactated ringers bolus 500 mL (500 mLs Intravenous Bolus 07/20/21 1403)    ED Course/ Medical Decision Making/ A&P                           Medical Decision Making Amount and/or Complexity of Data Reviewed Labs: ordered. Radiology: ordered.  Risk Decision regarding hospitalization.   This patient presents to the ED for concern of cough and shortness of breath, this involves an extensive number of treatment options, and is a complaint that carries with it a high risk of complications and morbidity.  The differential diagnosis includes pneumonia, PE, CHF, COPD   Co morbidities that complicate the patient evaluation  TIA, HTN, HLD, history of DVT, and CKD   Additional history obtained:  Additional history obtained  from N/A External records from outside source obtained and reviewed including EMR   Lab Tests:  I  Ordered, and personally interpreted labs.  The pertinent results include: Slight increase in creatinine from baseline, large leukocytosis, slight decrease in hemoglobin from baseline, elevated BNP   Imaging Studies ordered:  I ordered imaging studies including chest x-ray I independently visualized and interpreted imaging which showed multifocal pneumonia I agree with the radiologist interpretation   Cardiac Monitoring:  The patient was maintained on a cardiac monitor.  I personally viewed and interpreted the cardiac monitored which showed an underlying rhythm of: Sinus rhythm   Medicines ordered and prescription drug management:  I ordered medication including IV fluids and antibiotics for treatment of pneumonia Reevaluation of the patient after these medicines showed that the patient stayed the same I have reviewed the patients home medicines and have made adjustments as needed  Critical Interventions:  Identification and treatment of pneumonia  Problem List / ED Course:  86 year old female, independent at baseline, presenting for 3 days of fatigue, cough, shortness of breath.  Her cough has been productive of green sputum.  She has had fevers.  She initially presented to urgent care but was directed to come to the ED due to her recent symptoms as well as identification of a new hypoxia.  On arrival in the ED, patient remains on nasal cannula.  Her breathing is unlabored at this time.  She does not endorse any areas of discomfort.  On lung auscultation, she does not have any evidence of wheeze.  Per chart review, she does not have any history of chronic lung disease.  She has had recent decreased appetite.  Bolus of IV fluids was ordered.  Work-up was initiated.  On x-ray, patient has evidence of multifocal pneumonia.  This is consistent with her recent symptoms and current hypoxia.  Patient was treated for community-acquired pneumonia with azithromycin and ceftriaxone.  Given her age  and hypoxia, she will require hospital admission.   Reevaluation:  After the interventions noted above, I reevaluated the patient and found that they have :stayed the same   Social Determinants of Health:  Elderly, lives alone   Dispostion:  After consideration of the diagnostic results and the patients response to treatment, I feel that the patent would benefit from admission.          Final Clinical Impression(s) / ED Diagnoses Final diagnoses:  Pneumonia of both lungs due to infectious organism, unspecified part of lung  Acute respiratory failure with hypoxia [facility])    Rx / DC Orders ED Discharge Orders     None         Godfrey Pick, MD 07/20/21 1952

## 2021-07-20 NOTE — ED Provider Notes (Signed)
[facility]   MRN: [id number] DOB: Mar 22, 1936  Subjective:   Allison Morris is a 86 y.o. female with pmh of aortic dilatation presenting for 3 day history of acute onset worsening shortness of breath, productive cough, fever. Has also had mid-right sided chest pain with her cough. No history of heart disease, MI. No smoking. No history of asthma, COPD. Has not had COVID or influenza in the past 6 months.  Has a history of a DVT, takes clopidogrel daily.  No current facility-administered medications for this encounter.  Current Outpatient Medications:    acetaminophen (TYLENOL) 500 MG tablet, Take 500 mg by mouth every 6 (six) hours as needed for mild pain or moderate pain. , Disp: , Rfl:    aspirin EC 81 MG EC tablet, Take 1 tablet (81 mg total) by mouth daily., Disp: 100 tablet, Rfl: 1   Biotin 10000 MCG TABS, Take 10,000 mcg by mouth daily., Disp: , Rfl:    Cholecalciferol (VITAMIN D3) 1000 units CAPS, Take 1,000 Units by mouth daily., Disp: , Rfl:    clobetasol cream (TEMOVATE) 7.32 %, Apply 1 application topically daily as needed (for irritation). , Disp: , Rfl:    clopidogrel (PLAVIX) 75 MG tablet, Take 1 tablet (75 mg total) by mouth daily., Disp: 30 tablet, Rfl: 2   colchicine 0.6 MG tablet, Take 0.6 mg by mouth daily., Disp: , Rfl:    fexofenadine (ALLEGRA) 180 MG tablet, Take 180 mg by mouth daily., Disp: , Rfl:    Flax Oil-Fish Oil-Borage Oil (FISH-FLAX-BORAGE) CAPS, Take 1 capsule by mouth 2 (two) times daily., Disp: , Rfl:    fluticasone (FLONASE) 50 MCG/ACT nasal spray, Place 2 sprays into both nostrils daily as needed for allergies., Disp: , Rfl:    Glucosamine-Chondroit-Vit C-Mn (GLUCOSAMINE 1500 COMPLEX PO), Take 1 tablet by mouth daily. , Disp: , Rfl:    HYDROcodone-acetaminophen (NORCO/VICODIN) 5-325 MG tablet, Take 1 tablet by mouth every 6 (six) hours as needed., Disp: 6 tablet, Rfl: 0   nebivolol (BYSTOLIC) 2.5 MG tablet, Take 1 tablet (2.5 mg total) by  mouth daily as needed (if bp is over 180/165)., Disp: 30 tablet, Rfl: 2   pantoprazole (PROTONIX) 40 MG tablet, Take 40 mg by mouth daily. , Disp: , Rfl:    Polyethyl Glycol-Propyl Glycol (SYSTANE OP), Place 1 drop into both eyes daily as needed (dry eyes)., Disp: , Rfl:    traMADol (ULTRAM) 50 MG tablet, Take 50 mg by mouth 3 (three) times daily as needed for moderate pain., Disp: , Rfl:    trolamine salicylate (ASPERCREME) 10 % cream, Apply 1 application topically as needed for muscle pain., Disp: , Rfl:    vitamin C (ASCORBIC ACID) 500 MG tablet, Take 500 mg by mouth 2 (two) times daily., Disp: , Rfl:    Allergies  Allergen Reactions   Amlodipine     Caused "angina"   Biaxin [Clarithromycin]     headache   Doxycycline     Cramps    Estrogens     cramps   Felodipine     Swelling per patient, legs felt restless   Labetalol     dizziness   Lisinopril Cough   Losartan     "Angina" symptoms   Macrodantin [Nitrofurantoin]     headaches   Statins     cramps   Ceftin [Cefuroxime] Palpitations and Other (See Comments)    Rapid heart beat redness   Chlorthalidone     Dry Mouth  Ciprofloxacin Rash   Sulfa Antibiotics Itching    REDNESS    Past Medical History:  Diagnosis Date   Aortic dilatation ([place])    a. echo 08/2017 showed EF 65-70%, grade 1 DD, mild AI, mildly dilated ascending aorta (6mm), mild MR, mild TR.    Aortic regurgitation    Arthritis    hands   CKD (chronic kidney disease), stage III (HCC)    Essential hypertension    GERD (gastroesophageal reflux disease)    History of DVT (deep vein thrombosis)    Left leg 1997    Hyperlipemia    Mild aortic insufficiency    Mild mitral regurgitation    Mild tricuspid insufficiency    Seasonal allergies      Past Surgical History:  Procedure Laterality Date   APPENDECTOMY     CARPAL TUNNEL RELEASE     IR RADIOLOGIST EVAL & MGMT  01/21/2021   IR SACROPLASTY BILATERAL  12/18/2020   MASS EXCISION Right 08/07/2019    Procedure: EXCISION CYST RIGHT INDEX AND RIGHT MIDDLE FINGERS WITH DEBRIDEMENT OF METACARPALPHALANGEAL JOINTS;  Surgeon: Daryll Brod, MD;  Location: [facility];  Service: Orthopedics;  Laterality: Right;   TONSILLECTOMY      Family History  Problem Relation Age of Onset   Aneurysm Sister    Valvular heart disease Sister    Aneurysm Paternal Grandfather    Heart attack Paternal Grandfather     Social History   Tobacco Use   Smoking status: Never   Smokeless tobacco: Never  Vaping Use   Vaping Use: Never used  Substance Use Topics   Alcohol use: No    Alcohol/week: 0.0 standard drinks   Drug use: No    ROS   Objective:   Vitals: BP 119/70 (BP Location: Left Arm)    Pulse (!) 122    Temp 98.4 F (36.9 C) (Oral)    Resp (!) 24    SpO2 (!) 88%   Physical Exam Constitutional:      General: She is not in acute distress.    Appearance: Normal appearance. She is well-developed. She is not ill-appearing, toxic-appearing or diaphoretic.  HENT:     Head: Normocephalic and atraumatic.     Nose: Nose normal.     Mouth/Throat:     Mouth: Mucous membranes are moist.  Eyes:     General: No scleral icterus.       Right eye: No discharge.        Left eye: No discharge.     Extraocular Movements: Extraocular movements intact.  Cardiovascular:     Rate and Rhythm: Tachycardia present.     Heart sounds: No murmur heard.   No friction rub. No gallop.  Pulmonary:     Effort: No tachypnea, bradypnea or respiratory distress.     Breath sounds: No stridor. Examination of the right-lower field reveals decreased breath sounds. Examination of the left-lower field reveals decreased breath sounds. Decreased breath sounds present. No wheezing, rhonchi or rales.  Chest:     Chest wall: No tenderness.  Skin:    General: Skin is warm and dry.  Neurological:     General: No focal deficit present.     Mental Status: She is alert and oriented to person, place, and time.   Psychiatric:        Mood and Affect: Mood normal.        Behavior: Behavior normal.    ED ECG REPORT   Date: 07/20/2021  EKG Time: 11:23 AM  Rate: 112bpm  Rhythm: sinus tachycardia and premature ventricular contractions (PVC),  sinus tachycardia  Axis: Normal  Intervals:none  ST&T Change: T wave flattening in lead II, III  Narrative Interpretation: Sinus tachycardia at 112 bpm with occasional PVC and nonspecific T wave change compared to previous EKGs.   Assessment and Plan :   PDMP not reviewed this encounter.  1. Hypoxia   2. Midsternal chest pain   3. Tachycardia   4. Productive cough    Patient is in need of a higher level of care than we can provide in the urgent care setting.  She is hypoxic and tachycardic.  Suspicion is for an acute cardiopulmonary process, pneumonia, viral pneumonia (COVID versus influenza).  She is not having any history of pulmonary disorders and is not a smoker.  She required 4 L of oxygen to get to 92% pulse oximetry.  As such advised that we would be transporting her to the hospital by EMS.  She was in agreement.  Case reported out, transfer of care completed.   Jaynee Eagles, PA-C 07/20/21 1134

## 2021-07-20 NOTE — ED Triage Notes (Signed)
Pt reports shortness of breath x 1 day; pt reports blood pressure was low this morning 99/50's.

## 2021-07-20 NOTE — Progress Notes (Signed)
ANTICOAGULATION CONSULT NOTE - Initial Consult  Pharmacy Consult for heparin Indication: VTE prophylaxis  Allergies  Allergen Reactions   Amlodipine     Caused "angina"   Biaxin [Clarithromycin]     headache   Doxycycline     Cramps    Estrogens     cramps   Felodipine     Swelling per patient, legs felt restless   Labetalol     dizziness   Lisinopril Cough   Losartan     "Angina" symptoms   Macrodantin [Nitrofurantoin]     headaches   Statins     cramps   Ceftin [Cefuroxime] Palpitations and Other (See Comments)    Rapid heart beat redness   Chlorthalidone     Dry Mouth   Ciprofloxacin Rash   Sulfa Antibiotics Itching    REDNESS    Patient Measurements: Height: 5\' 6"  (167.6 cm) Weight: 49.9 kg (110 lb) IBW/kg (Calculated) : 59.3   Vital Signs: Temp: 101.4 F (38.6 C) (02/13 1727) Temp Source: Oral (02/13 1727) BP: 132/56 (02/13 1727) Pulse Rate: 100 (02/13 1727)  Labs: Recent Labs    07/20/21 1300  HGB 12.3  HCT 36.5  PLT 239  APTT 32  LABPROT 16.8*  INR 1.4*  CREATININE 1.71*    Estimated Creatinine Clearance: 19.6 mL/min (A) (by C-G formula based on SCr of 1.71 mg/dL (H)).   Medical History: Past Medical History:  Diagnosis Date   Aortic dilatation ([place])    a. echo 08/2017 showed EF 65-70%, grade 1 DD, mild AI, mildly dilated ascending aorta (62mm), mild MR, mild TR.    Aortic regurgitation    Arthritis    hands   CKD (chronic kidney disease), stage III (HCC)    Essential hypertension    GERD (gastroesophageal reflux disease)    History of DVT (deep vein thrombosis)    Left leg 1997    Hyperlipemia    Mild aortic insufficiency    Mild mitral regurgitation    Mild tricuspid insufficiency    Seasonal allergies      Assessment: 86 yo W with AKI on CKD. Pharmacy consulted for heparin for VTE prophylaxis.   Goal of Therapy:  Monitor platelets by anticoagulation protocol: Yes   Plan:  Heparin 5000 units subcutaneously Monitor  s/sx bleeding  Benetta Spar, PharmD, BCPS, BCCP Clinical Pharmacist  Please check AMION for all [place] phone numbers After 10:00 PM, call [place] [phone]

## 2021-07-20 NOTE — Assessment & Plan Note (Addendum)
-  Require 4 L nasal cannula O2 to maintain saturation >92% in the emergency department -no oxygen needed at discharge

## 2021-07-20 NOTE — ED Triage Notes (Signed)
Pt placed on 4 l 02 o2 increased to 95%

## 2021-07-20 NOTE — Progress Notes (Signed)
Elink following code sepsis °

## 2021-07-20 NOTE — ED Triage Notes (Signed)
Pt arrived via [facility] from urgent care for low oxygen saturation of 88 on room air and SOB. Pt is coughing up green phlegm

## 2021-07-20 NOTE — Assessment & Plan Note (Addendum)
-  Baseline Cr 1.5 -AKI ruled out  -Stable overall -advised to maintain adequate hydration.

## 2021-07-20 NOTE — H&P (Signed)
History and Physical    Patient: Allison Morris ERD:[id number] DOB: 1935-09-09 DOA: 07/20/2021 DOS: the patient was seen and examined on 07/20/2021 PCP: Redmond School, MD  Patient coming from: Home  Chief Complaint:  Chief Complaint  Patient presents with   Shortness of Breath    HPI: [name] is a 86 y.o. female with medical history significant of HTN who presents with chief complaint of SOB.  She initially presented to urgent care, was requiring 4 L oxygen to get to 92% SPO2 and was sent to the emergency department. Patient states that over the past 3 days, she has been short of breath and having productive cough of green/brown sputum.  Had a fever yesterday which was well controlled with Tylenol.  No chest pain, nausea, vomiting, diarrhea or abdominal pain.  Review of Systems: As mentioned in the history of present illness. All other systems reviewed and are negative. Past Medical History:  Diagnosis Date   Aortic dilatation ([place])    a. echo 08/2017 showed EF 65-70%, grade 1 DD, mild AI, mildly dilated ascending aorta (54mm), mild MR, mild TR.    Aortic regurgitation    Arthritis    hands   CKD (chronic kidney disease), stage III (HCC)    Essential hypertension    GERD (gastroesophageal reflux disease)    History of DVT (deep vein thrombosis)    Left leg 1997    Hyperlipemia    Mild aortic insufficiency    Mild mitral regurgitation    Mild tricuspid insufficiency    Seasonal allergies    Past Surgical History:  Procedure Laterality Date   APPENDECTOMY     CARPAL TUNNEL RELEASE     IR RADIOLOGIST EVAL & MGMT  01/21/2021   IR SACROPLASTY BILATERAL  12/18/2020   MASS EXCISION Right 08/07/2019   Procedure: EXCISION CYST RIGHT INDEX AND RIGHT MIDDLE FINGERS WITH DEBRIDEMENT OF METACARPALPHALANGEAL JOINTS;  Surgeon: Daryll Brod, MD;  Location: [facility];  Service: Orthopedics;  Laterality: Right;   TONSILLECTOMY     Social History:  reports that she has  never smoked. She has never used smokeless tobacco. She reports that she does not drink alcohol and does not use drugs.  Allergies  Allergen Reactions   Amlodipine     Caused "angina"   Biaxin [Clarithromycin]     headache   Doxycycline     Cramps    Estrogens     cramps   Felodipine     Swelling per patient, legs felt restless   Labetalol     dizziness   Lisinopril Cough   Losartan     "Angina" symptoms   Macrodantin [Nitrofurantoin]     headaches   Statins     cramps   Ceftin [Cefuroxime] Palpitations and Other (See Comments)    Rapid heart beat redness   Chlorthalidone     Dry Mouth   Ciprofloxacin Rash   Sulfa Antibiotics Itching    REDNESS    Family History  Problem Relation Age of Onset   Aneurysm Sister    Valvular heart disease Sister    Aneurysm Paternal Grandfather    Heart attack Paternal Grandfather     Prior to Admission medications   Medication Sig Start Date End Date Taking? Authorizing Provider  acetaminophen (TYLENOL) 500 MG tablet Take 500 mg by mouth every 6 (six) hours as needed for mild pain or moderate pain.     [provider]  aspirin EC 81 MG EC  tablet Take 1 tablet (81 mg total) by mouth daily. 11/15/15   Orvan Falconer, MD  Biotin 10000 MCG TABS Take 10,000 mcg by mouth daily.    [provider]  Cholecalciferol (VITAMIN D3) 1000 units CAPS Take 1,000 Units by mouth daily.    [provider]  clobetasol cream (TEMOVATE) 0.93 % Apply 1 application topically daily as needed (for irritation).     [provider]  clopidogrel (PLAVIX) 75 MG tablet Take 1 tablet (75 mg total) by mouth daily. 11/15/15   Orvan Falconer, MD  colchicine 0.6 MG tablet Take 0.6 mg by mouth daily.    [provider]  fexofenadine (ALLEGRA) 180 MG tablet Take 180 mg by mouth daily.    [provider]  Flax Oil-Fish Oil-Borage Oil (FISH-FLAX-BORAGE) CAPS Take 1 capsule by mouth 2 (two) times daily.    [provider]   fluticasone (FLONASE) 50 MCG/ACT nasal spray Place 2 sprays into both nostrils daily as needed for allergies.    [provider]  Glucosamine-Chondroit-Vit C-Mn (GLUCOSAMINE 1500 COMPLEX PO) Take 1 tablet by mouth daily.     [provider]  HYDROcodone-acetaminophen (NORCO/VICODIN) 5-325 MG tablet Take 1 tablet by mouth every 6 (six) hours as needed. 06/03/21   Noemi Chapel, MD  nebivolol (BYSTOLIC) 2.5 MG tablet Take 1 tablet (2.5 mg total) by mouth daily as needed (if bp is over 180/165). 06/09/21   Satira Sark, MD  pantoprazole (PROTONIX) 40 MG tablet Take 40 mg by mouth daily.     [provider]  Polyethyl Glycol-Propyl Glycol (SYSTANE OP) Place 1 drop into both eyes daily as needed (dry eyes).    [provider]  traMADol (ULTRAM) 50 MG tablet Take 50 mg by mouth 3 (three) times daily as needed for moderate pain. 07/04/20   [provider]  trolamine salicylate (ASPERCREME) 10 % cream Apply 1 application topically as needed for muscle pain.    [provider]  vitamin C (ASCORBIC ACID) 500 MG tablet Take 500 mg by mouth 2 (two) times daily.    [provider]    Physical Exam: Vitals:   07/20/21 1247 07/20/21 1300 07/20/21 1400 07/20/21 1430  BP:  (!) 146/83 132/62 137/69  Pulse:  92 91 94  Resp:  (!) 25 20 20   Temp:    (!) 101.1 F (38.4 C)  TempSrc:    Oral  SpO2: 100% 96% 100% 99%  Weight:      Height:       Examination: General exam: Appears calm and comfortable  Respiratory system: Crackles left lung field, no respiratory distress, no conversational dyspnea, on 2 L oxygen Cardiovascular system: S1 & S2 heard, RRR. No pedal edema. Gastrointestinal system: Abdomen is nondistended, soft and nontender. Normal bowel sounds heard. Central nervous system: Alert and oriented. Non focal exam. Speech clear  Extremities: Symmetric in appearance bilaterally  Skin: No rashes, lesions or ulcers on exposed skin   Psychiatry: Judgement and insight appear stable. Mood & affect appropriate.    Data Reviewed:  BMP revealed creatinine 1.71, BNP 354, WBC 31  Assessment and Plan: * Sepsis due to pneumonia ([place])- (present on admission) Sepsis secondary to CAP, sepsis POA with fever 101.1, HR > 90, RR > 20, WBC 31 with respiratory failure and kidney injury Sepsis order set utilized Check lactic acid IVF bolus and maintenance  Rocephin, azithromycin  Acute respiratory failure with hypoxia ([place])- (present on admission) Require 4 L nasal cannula O2 to  maintain saturation >92% in the emergency department, wean as able  Stage 3b chronic kidney disease (CKD) ([place]) Baseline Cr 1.5 Stable, monitor   Hypertension- (present on admission) Continue Bystolic PRN        Advance Care Planning:   Code Status: Prior   Consults: None   Family Communication: Daughter at bedside   Severity of Illness: The appropriate patient status for this patient is INPATIENT. Inpatient status is judged to be reasonable and necessary in order to provide the required intensity of service to ensure the patient's safety. The patient's presenting symptoms, physical exam findings, and initial radiographic and laboratory data in the context of their chronic comorbidities is felt to place them at high risk for further clinical deterioration. Furthermore, it is not anticipated that the patient will be medically stable for discharge from the hospital within 2 midnights of admission.   * I certify that at the point of admission it is my clinical judgment that the patient will require inpatient hospital care spanning beyond 2 midnights from the point of admission due to high intensity of service, high risk for further deterioration and high frequency of surveillance required.*  Author: Dessa Phi, DO 07/20/2021 3:02 PM  For on call review [email or web address].

## 2021-07-20 NOTE — Assessment & Plan Note (Addendum)
-  patient with sepsis secondary to CAP, sepsis POA with fever 101.1, RR > 20, WBC 31 with respiratory failure. -Sepsis order set utilized -Lactic acid 1.7  -Blood cultures without growth -Respiratory no growth up to date -advise to maintain adequate hydration  -patient discharge on cefdinir to complete antibiotics regimen -WBC improving and overall sepsis features resolved -Patient afebrile, hemodynamically stable and not requiring oxygen supplementation at discharge.

## 2021-07-20 NOTE — ED Triage Notes (Signed)
.  ucto 

## 2021-07-21 DIAGNOSIS — E785 Hyperlipidemia, unspecified: Secondary | ICD-10-CM | POA: Diagnosis not present

## 2021-07-21 DIAGNOSIS — Z20822 Contact with and (suspected) exposure to covid-19: Secondary | ICD-10-CM | POA: Diagnosis not present

## 2021-07-21 DIAGNOSIS — J189 Pneumonia, unspecified organism: Secondary | ICD-10-CM | POA: Diagnosis not present

## 2021-07-21 DIAGNOSIS — Z86718 Personal history of other venous thrombosis and embolism: Secondary | ICD-10-CM | POA: Diagnosis not present

## 2021-07-21 DIAGNOSIS — M19042 Primary osteoarthritis, left hand: Secondary | ICD-10-CM | POA: Diagnosis not present

## 2021-07-21 DIAGNOSIS — J9601 Acute respiratory failure with hypoxia: Secondary | ICD-10-CM | POA: Diagnosis not present

## 2021-07-21 DIAGNOSIS — R0602 Shortness of breath: Secondary | ICD-10-CM | POA: Diagnosis not present

## 2021-07-21 DIAGNOSIS — I083 Combined rheumatic disorders of mitral, aortic and tricuspid valves: Secondary | ICD-10-CM | POA: Diagnosis not present

## 2021-07-21 DIAGNOSIS — A419 Sepsis, unspecified organism: Secondary | ICD-10-CM | POA: Diagnosis not present

## 2021-07-21 DIAGNOSIS — Z79899 Other long term (current) drug therapy: Secondary | ICD-10-CM | POA: Diagnosis not present

## 2021-07-21 DIAGNOSIS — J159 Unspecified bacterial pneumonia: Secondary | ICD-10-CM | POA: Diagnosis not present

## 2021-07-21 DIAGNOSIS — Z888 Allergy status to other drugs, medicaments and biological substances status: Secondary | ICD-10-CM | POA: Diagnosis not present

## 2021-07-21 DIAGNOSIS — Z882 Allergy status to sulfonamides status: Secondary | ICD-10-CM | POA: Diagnosis not present

## 2021-07-21 DIAGNOSIS — Z881 Allergy status to other antibiotic agents status: Secondary | ICD-10-CM | POA: Diagnosis not present

## 2021-07-21 DIAGNOSIS — I129 Hypertensive chronic kidney disease with stage 1 through stage 4 chronic kidney disease, or unspecified chronic kidney disease: Secondary | ICD-10-CM | POA: Diagnosis not present

## 2021-07-21 DIAGNOSIS — Z7982 Long term (current) use of aspirin: Secondary | ICD-10-CM | POA: Diagnosis not present

## 2021-07-21 DIAGNOSIS — Z7902 Long term (current) use of antithrombotics/antiplatelets: Secondary | ICD-10-CM | POA: Diagnosis not present

## 2021-07-21 DIAGNOSIS — N1832 Chronic kidney disease, stage 3b: Secondary | ICD-10-CM | POA: Diagnosis not present

## 2021-07-21 LAB — BASIC METABOLIC PANEL
Anion gap: 11 (ref 5–15)
BUN: 30 mg/dL — ABNORMAL HIGH (ref 8–23)
CO2: 18 mmol/L — ABNORMAL LOW (ref 22–32)
Calcium: 8.7 mg/dL — ABNORMAL LOW (ref 8.9–10.3)
Chloride: 106 mmol/L (ref 98–111)
Creatinine, Ser: 1.53 mg/dL — ABNORMAL HIGH (ref 0.44–1.00)
GFR, Estimated: 34 mL/min — ABNORMAL LOW (ref >60)
Glucose, Bld: 92 mg/dL (ref 70–99)
Potassium: 3.7 mmol/L (ref 3.5–5.1)
Sodium: 135 mmol/L (ref 135–145)

## 2021-07-21 LAB — CBC
HCT: 32.8 % — ABNORMAL LOW (ref 36.0–46.0)
Hemoglobin: 11 g/dL — ABNORMAL LOW (ref 12.0–15.0)
MCH: 35.1 pg — ABNORMAL HIGH (ref 26.0–34.0)
MCHC: 33.5 g/dL (ref 30.0–36.0)
MCV: 104.8 fL — ABNORMAL HIGH (ref 80.0–100.0)
Platelets: 235 10*3/uL (ref 150–400)
RBC: 3.13 MIL/uL — ABNORMAL LOW (ref 3.87–5.11)
RDW: 13 % (ref 11.5–15.5)
WBC: 26 10*3/uL — ABNORMAL HIGH (ref 4.0–10.5)
nRBC: 0 % (ref 0.0–0.2)

## 2021-07-21 LAB — EXPECTORATED SPUTUM ASSESSMENT W GRAM STAIN, RFLX TO RESP C

## 2021-07-21 MED ORDER — ACETAMINOPHEN 325 MG PO TABS
650.0000 mg | ORAL_TABLET | Freq: Four times a day (QID) | ORAL | Status: DC | PRN
  Administered 2021-07-22 (×3): 650 mg via ORAL
  Filled 2021-07-21 (×3): qty 2

## 2021-07-21 NOTE — Progress Notes (Signed)
°  Progress Note   Patient: Allison Morris DOB: 02/13/1936 DOA: 07/20/2021     1 DOS: the patient was seen and examined on 07/21/2021   Brief hospital course: Allison Morris is a 86 y.o. female with medical history significant of HTN who presents with chief complaint of SOB.  She initially presented to urgent care, was requiring 4 L oxygen to get to 92% SPO2 and was sent to the emergency department. Patient states that over the past 3 days, she has been short of breath and having productive cough of green/brown sputum.  Had a fever yesterday which was well controlled with Tylenol.  No chest pain, nausea, vomiting, diarrhea or abdominal pain.  She presented with sepsis secondary to CAP, sepsis POA with fever 101.1, HR > 90, RR > 20, WBC 31 with respiratory failure. She was started on IV antibiotics, IV fluid as well as required nasal cannula O2 on admission.  2/14: Patient sitting in bed eating breakfast.  Denies any worsening symptoms or shortness of breath.  She remained afebrile overnight.  WBC trending downward.  Assessment and Plan: * Sepsis due to pneumonia ([place])- (present on admission) Sepsis secondary to CAP, sepsis POA with fever 101.1, HR > 90, RR > 20, WBC 31 with respiratory failure Sepsis order set utilized Lactic acid 1.7  Blood cultures pending Respiratory culture pending IVF Rocephin, azithromycin WBC improving   Acute respiratory failure with hypoxia ([place])- (present on admission) Require 4 L nasal cannula O2 to maintain saturation >92% in the emergency department Currently on 2L O2, wean as able  Stage 3b chronic kidney disease (CKD) ([place]) Baseline Cr 1.5 AKI ruled out  Stable, monitor   Hypertension- (present on admission) Continue Bystolic PRN          Physical Exam: Vitals:   07/20/21 2000 07/20/21 2103 07/21/21 0444 07/21/21 0744  BP:  (!) 101/55 132/74   Pulse:  92 78   Resp:  19 18   Temp:  98.8 F (37.1 C) 98.2 F (36.8 C)    TempSrc:  Oral Oral   SpO2: 95% 95% 95% 96%  Weight:      Height:       Examination: General exam: Appears calm and comfortable  Respiratory system: Mild crackles, no distress, on 2L O2  Cardiovascular system: S1 & S2 heard, RRR. No pedal edema. Gastrointestinal system: Abdomen is nondistended, soft and nontender. Normal bowel sounds heard. Central nervous system: Alert and oriented. Non focal exam. Speech clear  Extremities: Symmetric in appearance bilaterally  Skin: No rashes, lesions or ulcers on exposed skin  Psychiatry: Judgement and insight appear stable. Mood & affect appropriate.    Data Reviewed:   Creatinine 1.53, WBC 26, lactic acid 1.7, 1.6  Family Communication: None at bedside   Disposition: Status is: Inpatient Remains inpatient appropriate because: Remains on oxygen and IV antibiotics          Planned Discharge Destination: Home      Author: Dessa Phi, DO 07/21/2021 10:23 AM  For on call review [email or web address].

## 2021-07-21 NOTE — TOC Progression Note (Signed)
°  Transition of Care [facility]) Screening Note   Patient Details  Name: Allison Morris Date of Birth: [birth year]   Transition of Care [facility]) CM/SW Contact:    Shade Flood, LCSW Phone Number: 07/21/2021, 10:26 AM    Transition of Care Department [facility]) has reviewed patient and no TOC needs have been identified at this time. We will continue to monitor patient advancement through interdisciplinary progression rounds. If new patient transition needs arise, please place a TOC consult.

## 2021-07-21 NOTE — Hospital Course (Signed)
Allison Morris is a 86 y.o. female with medical history significant of HTN who presents with chief complaint of SOB.  She initially presented to urgent care, was requiring 4 L oxygen to get to 92% SPO2 and was sent to the emergency department. Patient states that over the past 3 days, she has been short of breath and having productive cough of green/brown sputum.  Had a fever yesterday which was well controlled with Tylenol.  No chest pain, nausea, vomiting, diarrhea or abdominal pain.  She presented with sepsis secondary to CAP, sepsis POA with fever 101.1, HR > 90, RR > 20, WBC 31 with respiratory failure. She was started on IV antibiotics, IV fluid as well as required nasal cannula O2 on admission.  2/14: Patient sitting in bed eating breakfast.  Denies any worsening symptoms or shortness of breath.  She remained afebrile overnight.  WBC trending downward.

## 2021-07-22 DIAGNOSIS — Z7982 Long term (current) use of aspirin: Secondary | ICD-10-CM | POA: Diagnosis not present

## 2021-07-22 DIAGNOSIS — N1832 Chronic kidney disease, stage 3b: Secondary | ICD-10-CM

## 2021-07-22 DIAGNOSIS — I083 Combined rheumatic disorders of mitral, aortic and tricuspid valves: Secondary | ICD-10-CM | POA: Diagnosis not present

## 2021-07-22 DIAGNOSIS — J189 Pneumonia, unspecified organism: Secondary | ICD-10-CM | POA: Diagnosis not present

## 2021-07-22 DIAGNOSIS — J9601 Acute respiratory failure with hypoxia: Secondary | ICD-10-CM

## 2021-07-22 DIAGNOSIS — Z888 Allergy status to other drugs, medicaments and biological substances status: Secondary | ICD-10-CM | POA: Diagnosis not present

## 2021-07-22 DIAGNOSIS — Z881 Allergy status to other antibiotic agents status: Secondary | ICD-10-CM | POA: Diagnosis not present

## 2021-07-22 DIAGNOSIS — Z79899 Other long term (current) drug therapy: Secondary | ICD-10-CM | POA: Diagnosis not present

## 2021-07-22 DIAGNOSIS — J159 Unspecified bacterial pneumonia: Secondary | ICD-10-CM | POA: Diagnosis not present

## 2021-07-22 DIAGNOSIS — R0602 Shortness of breath: Secondary | ICD-10-CM | POA: Diagnosis not present

## 2021-07-22 DIAGNOSIS — Z20822 Contact with and (suspected) exposure to covid-19: Secondary | ICD-10-CM | POA: Diagnosis not present

## 2021-07-22 DIAGNOSIS — I10 Essential (primary) hypertension: Secondary | ICD-10-CM

## 2021-07-22 DIAGNOSIS — E785 Hyperlipidemia, unspecified: Secondary | ICD-10-CM | POA: Diagnosis not present

## 2021-07-22 DIAGNOSIS — Z7902 Long term (current) use of antithrombotics/antiplatelets: Secondary | ICD-10-CM | POA: Diagnosis not present

## 2021-07-22 DIAGNOSIS — Z86718 Personal history of other venous thrombosis and embolism: Secondary | ICD-10-CM | POA: Diagnosis not present

## 2021-07-22 DIAGNOSIS — Z882 Allergy status to sulfonamides status: Secondary | ICD-10-CM | POA: Diagnosis not present

## 2021-07-22 DIAGNOSIS — M19042 Primary osteoarthritis, left hand: Secondary | ICD-10-CM | POA: Diagnosis not present

## 2021-07-22 DIAGNOSIS — A419 Sepsis, unspecified organism: Secondary | ICD-10-CM | POA: Diagnosis not present

## 2021-07-22 DIAGNOSIS — I129 Hypertensive chronic kidney disease with stage 1 through stage 4 chronic kidney disease, or unspecified chronic kidney disease: Secondary | ICD-10-CM | POA: Diagnosis not present

## 2021-07-22 LAB — CBC
HCT: 31.5 % — ABNORMAL LOW (ref 36.0–46.0)
Hemoglobin: 9.9 g/dL — ABNORMAL LOW (ref 12.0–15.0)
MCH: 32.9 pg (ref 26.0–34.0)
MCHC: 31.4 g/dL (ref 30.0–36.0)
MCV: 104.7 fL — ABNORMAL HIGH (ref 80.0–100.0)
Platelets: 225 10*3/uL (ref 150–400)
RBC: 3.01 MIL/uL — ABNORMAL LOW (ref 3.87–5.11)
RDW: 12.8 % (ref 11.5–15.5)
WBC: 12.6 10*3/uL — ABNORMAL HIGH (ref 4.0–10.5)
nRBC: 0 % (ref 0.0–0.2)

## 2021-07-22 LAB — BASIC METABOLIC PANEL
Anion gap: 12 (ref 5–15)
BUN: 21 mg/dL (ref 8–23)
CO2: 15 mmol/L — ABNORMAL LOW (ref 22–32)
Calcium: 8.6 mg/dL — ABNORMAL LOW (ref 8.9–10.3)
Chloride: 109 mmol/L (ref 98–111)
Creatinine, Ser: 1.3 mg/dL — ABNORMAL HIGH (ref 0.44–1.00)
GFR, Estimated: 41 mL/min — ABNORMAL LOW (ref >60)
Glucose, Bld: 93 mg/dL (ref 70–99)
Potassium: 3.1 mmol/L — ABNORMAL LOW (ref 3.5–5.1)
Sodium: 136 mmol/L (ref 135–145)

## 2021-07-22 MED ORDER — COLCHICINE 0.6 MG PO TABS: 0.3000 mg | ORAL_TABLET | Freq: Every day | ORAL | Status: DC

## 2021-07-22 MED ORDER — CEFDINIR 300 MG PO CAPS: 300.0000 mg | ORAL_CAPSULE | Freq: Two times a day (BID) | ORAL | 0 refills | Status: AC

## 2021-07-22 MED ORDER — POTASSIUM CHLORIDE CRYS ER 20 MEQ PO TBCR
40.0000 meq | EXTENDED_RELEASE_TABLET | Freq: Once | ORAL | Status: AC
  Administered 2021-07-22: 40 meq via ORAL
  Filled 2021-07-22: qty 2

## 2021-07-22 MED ORDER — GUAIFENESIN-DM 100-10 MG/5ML PO SYRP
5.0000 mL | ORAL_SOLUTION | ORAL | Status: DC | PRN
  Administered 2021-07-22: 5 mL via ORAL
  Filled 2021-07-22: qty 5

## 2021-07-22 MED ORDER — GUAIFENESIN-DM 100-10 MG/5ML PO SYRP: 5.0000 mL | ORAL_SOLUTION | ORAL | 0 refills | Status: DC | PRN

## 2021-07-22 NOTE — TOC Transition Note (Signed)
Transition of Care [facility]) - CM/SW Discharge Note   Patient Details  Name: Allison Morris MRN: [id number] Date of Birth: Oct 21, 1935  Transition of Care [facility]) CM/SW Contact:  Salome Arnt, [place] Phone Number: 07/22/2021, 3:21 PM   Clinical Narrative: Discussed pt in 3:00 progression rounds. Per MD, pt d/c today and no needs.       Final next level of care: Home/Self Care Barriers to Discharge: Barriers Resolved   Patient Goals and CMS Choice Patient states their goals for this hospitalization and ongoing recovery are:: return home      Discharge Placement                       Discharge Plan and Services                                     Social Determinants of Health (SDOH) Interventions     Readmission Risk Interventions No flowsheet data found.

## 2021-07-22 NOTE — Progress Notes (Signed)
SATURATION QUALIFICATIONS: (This note is used to comply with regulatory documentation for home oxygen)  Patient Saturations on Room Air at Rest = 97%  Patient Saturations on Room Air while Ambulating = 94%   Please briefly explain why patient needs home oxygen:

## 2021-07-22 NOTE — Care Management Important Message (Signed)
Important Message  Patient Details  Name: Allison Morris MRN: [id number] Date of Birth: [birth year]   Medicare Important Message Given:  N/A - LOS <3 / Initial given by admissions     Tommy Medal 07/22/2021, 3:36 PM

## 2021-07-22 NOTE — Discharge Summary (Signed)
Physician Discharge Summary   Patient: Allison Morris MRN: [id number] DOB: [birth year]  Admit date:     07/20/2021  Discharge date: 07/22/21  Discharge Physician: Barton Dubois   PCP: Allison School, MD   Recommendations at discharge:  Repeat BMET to follow electrolytes and renal function. Repeat CXR in 8 weeks to assure resolution of infiltrates. Reassess BP and adjust antihypertensive regimen as needed. Repeat CBC to follow WBC's trend and resolution.  Discharge Diagnoses: Principal Problem:   Pneumonia of both lungs due to infectious organism Active Problems:   Hypertension   Acute respiratory failure with hypoxia (HCC)   Stage 3b chronic kidney disease (CKD) [facility])  Hospital brief admission narrative: Allison Morris is a 86 y.o. female with medical history significant of HTN who presents with chief complaint of SOB.  She initially presented to urgent care, was requiring 4 L oxygen to get to 92% SPO2 and was sent to the emergency department. Patient states that over the past 3 days, she has been short of breath and having productive cough of green/brown sputum.  Had a fever yesterday which was well controlled with Tylenol.  No chest pain, nausea, vomiting, diarrhea or abdominal pain.  Assessment and Plan: * Pneumonia of both lungs due to infectious organism -patient with sepsis secondary to CAP, sepsis POA with fever 101.1, RR > 20, WBC 31 with respiratory failure. -Sepsis order set utilized -Lactic acid 1.7  -Blood cultures without growth -Respiratory no growth up to date -advise to maintain adequate hydration  -patient discharge on cefdinir to complete antibiotics regimen -WBC improving and overall sepsis features resolved -Patient afebrile, hemodynamically stable and not requiring oxygen supplementation at discharge.  Stage 3b chronic kidney disease (CKD) (HCC) -Baseline Cr 1.5 -AKI ruled out  -Stable overall -advised to maintain adequate hydration.  Acute  respiratory failure with hypoxia ([place])- (present on admission) -Require 4 L nasal cannula O2 to maintain saturation >92% in the emergency department -no oxygen needed at discharge   Hypertension- (present on admission) -Continue Bystolic  -continue heart healthy diet.   Consultants: none  Procedures performed: see below for x-ray reports.   Disposition: Home Diet recommendation:  Discharge Diet Orders (From admission, onward)     Start     Ordered   07/22/21 0000  Diet general        07/22/21 1511           Regular diet  DISCHARGE MEDICATION: Allergies as of 07/22/2021       Reactions   Amlodipine    Caused "angina"   Biaxin [clarithromycin]    headache   Doxycycline    Cramps   Estrogens    cramps   Felodipine    Swelling per patient, legs felt restless   Labetalol    dizziness   Lisinopril Cough   Losartan    "Angina" symptoms   Macrodantin [nitrofurantoin]    headaches   Statins    cramps   Ceftin [cefuroxime] Palpitations, Other (See Comments)   Rapid heart beat redness   Chlorthalidone    Dry Mouth   Ciprofloxacin Rash   Sulfa Antibiotics Itching   REDNESS        Medication List     STOP taking these medications    allopurinol 100 MG tablet Commonly known as: ZYLOPRIM       TAKE these medications    acetaminophen 500 MG tablet Commonly known as: TYLENOL Take 500 mg by mouth every 6 (six) hours as needed for mild  pain or moderate pain.   aspirin 81 MG EC tablet Take 1 tablet (81 mg total) by mouth daily.   Biotin 10000 MCG Tabs Take 10,000 mcg by mouth daily.   cefdinir 300 MG capsule Commonly known as: OMNICEF Take 1 capsule (300 mg total) by mouth 2 (two) times daily for 5 days.   clobetasol cream 0.05 % Commonly known as: TEMOVATE Apply 1 application topically daily as needed (for irritation).   clopidogrel 75 MG tablet Commonly known as: PLAVIX Take 1 tablet (75 mg total) by mouth daily.   colchicine 0.6 MG  tablet Take 0.5 tablets (0.3 mg total) by mouth daily. What changed: how much to take   fexofenadine 180 MG tablet Commonly known as: ALLEGRA Take 180 mg by mouth daily.   Fish-Flax-Borage Caps Take 1 capsule by mouth 2 (two) times daily.   fluticasone 50 MCG/ACT nasal spray Commonly known as: FLONASE Place 2 sprays into both nostrils daily as needed for allergies.   GLUCOSAMINE 1500 COMPLEX PO Take 1 tablet by mouth daily.   guaiFENesin-dextromethorphan 100-10 MG/5ML syrup Commonly known as: ROBITUSSIN DM Take 5 mLs by mouth every 4 (four) hours as needed for cough.   HYDROcodone-acetaminophen 5-325 MG tablet Commonly known as: NORCO/VICODIN Take 1 tablet by mouth every 6 (six) hours as needed.   nebivolol 2.5 MG tablet Commonly known as: BYSTOLIC Take 1 tablet (2.5 mg total) by mouth daily as needed (if bp is over 180/165).   pantoprazole 40 MG tablet Commonly known as: PROTONIX Take 40 mg by mouth daily.   SYSTANE OP Place 1 drop into both eyes daily as needed (dry eyes).   trolamine salicylate 10 % cream Commonly known as: ASPERCREME Apply 1 application topically as needed for muscle pain.   vitamin C 500 MG tablet Commonly known as: ASCORBIC ACID Take 500 mg by mouth 2 (two) times daily.   Vitamin D3 25 MCG (1000 UT) Caps Take 1,000 Units by mouth daily.        Follow-up Information     Allison School, MD. Schedule an appointment as soon as possible for a visit in 10 day(s).   Specialty: Internal Medicine Contact information: [address] [place] [address] [phone]         Allison Sark, MD .   Specialty: Cardiology Contact information: [place] [address] [phone]                 Discharge Exam: Allison Morris Weights   07/20/21 1214  Weight: 49.9 kg   General exam: Alert, awake, oriented x 3, not requiring oxygen supplementation. Afebrile.  Respiratory system: improved air movement,  positive scattered rhonchi, no wheezing, not using accessory muscles. Cardiovascular system: RRR. No murmurs, rubs, gallops. Gastrointestinal system: Abdomen is nondistended, soft and nontender. No organomegaly or masses felt. Normal bowel sounds heard. Central nervous system: Alert and oriented. No focal neurological deficits. Extremities: No cyanosis, no clubbing, no edema.  Skin: No cyanosis, no clubbing.  Psychiatry: Judgement and insight appear normal. Mood & affect appropriate.    Condition at discharge: improving  The results of significant diagnostics from this hospitalization (including imaging, microbiology, ancillary and laboratory) are listed below for reference.   Imaging Studies: DG Chest Port 1 View  Result Date: 07/20/2021 CLINICAL DATA:  Cough.  Shortness of breath. EXAM: PORTABLE CHEST 1 VIEW COMPARISON:  Chest two views 06/05/2020 FINDINGS: Cardiac silhouette and mediastinal contours are within normal limits with calcification again seen within  the aortic arch. There is new heterogeneous airspace opacification overlying the lateral left lower lung. Minimal new airspace density is also seen within the inferolateral right upper lobe abutting the right minor fissure. No definite pleural effusion. No pneumothorax. Diffuse decreased bone mineralization with multilevel degenerative disc changes of the thoracic spine. IMPRESSION: New left basilar and minimal inferolateral right upper lobe airspace opacification suspicious for pneumonia. Electronically Signed   By: Yvonne Kendall M.D.   On: 07/20/2021 12:45    Microbiology: Results for orders placed or performed during the hospital encounter of 07/20/21  Resp Panel by RT-PCR (Flu A&B, Covid) Nasopharyngeal Swab     Status: None   Collection Time: 07/20/21 12:09 PM   Specimen: Nasopharyngeal Swab; Nasopharyngeal(NP) swabs in vial transport medium  Result Value Ref Range Status   SARS Coronavirus 2 by RT PCR NEGATIVE NEGATIVE Final     Comment: (NOTE) SARS-CoV-2 target nucleic acids are NOT DETECTED.  The SARS-CoV-2 RNA is generally detectable in upper respiratory specimens during the acute phase of infection. The lowest concentration of SARS-CoV-2 viral copies this assay can detect is 138 copies/mL. A negative result does not preclude SARS-Cov-2 infection and should not be used as the sole basis for treatment or other patient management decisions. A negative result may occur with  improper specimen collection/handling, submission of specimen other than nasopharyngeal swab, presence of viral mutation(s) within the areas targeted by this assay, and inadequate number of viral copies(<138 copies/mL). A negative result must be combined with clinical observations, patient history, and epidemiological information. The expected result is Negative.  Fact Sheet for Patients:  EntrepreneurPulse.com.au  Fact Sheet for Healthcare Providers:  IncredibleEmployment.be  This test is no t yet approved or cleared by the [place] FDA and  has been authorized for detection and/or diagnosis of SARS-CoV-2 by FDA under an Emergency Use Authorization (EUA). This EUA will remain  in effect (meaning this test can be used) for the duration of the COVID-19 declaration under Section 564(b)(1) of the Act, 21 U.S.C.section 360bbb-3(b)(1), unless the authorization is terminated  or revoked sooner.       Influenza A by PCR NEGATIVE NEGATIVE Final   Influenza B by PCR NEGATIVE NEGATIVE Final    Comment: (NOTE) The Xpert Xpress SARS-CoV-2/FLU/RSV plus assay is intended as an aid in the diagnosis of influenza from Nasopharyngeal swab specimens and should not be used as a sole basis for treatment. Nasal washings and aspirates are unacceptable for Xpert Xpress SARS-CoV-2/FLU/RSV testing.  Fact Sheet for Patients: EntrepreneurPulse.com.au  Fact Sheet for Healthcare  Providers: IncredibleEmployment.be  This test is not yet approved or cleared by the [place] FDA and has been authorized for detection and/or diagnosis of SARS-CoV-2 by FDA under an Emergency Use Authorization (EUA). This EUA will remain in effect (meaning this test can be used) for the duration of the COVID-19 declaration under Section 564(b)(1) of the Act, 21 U.S.C. section 360bbb-3(b)(1), unless the authorization is terminated or revoked.  Performed at [facility], [address] [place], [place] [address]   Blood culture (routine x 2)     Status: None (Preliminary result)   Collection Time: 07/20/21  1:28 PM   Specimen: Site Not Specified; Blood  Result Value Ref Range Status   Specimen Description SITE NOT SPECIFIED  Final   Special Requests   Final    BOTTLES DRAWN AEROBIC AND ANAEROBIC Blood Culture adequate volume   Culture   Final    NO GROWTH 2 DAYS Performed at [facility], [address], [place], [place] [address]    Report Status PENDING  Incomplete  Blood culture (routine x 2)     Status: None (Preliminary result)   Collection Time: 07/20/21  1:28 PM   Specimen: Right Antecubital; Blood  Result Value Ref Range Status   Specimen Description RIGHT ANTECUBITAL  Final   Special Requests   Final    BOTTLES DRAWN AEROBIC AND ANAEROBIC Blood Culture adequate volume   Culture   Final    NO GROWTH 2 DAYS Performed at [facility], [address] [place], [place] [address]    Report Status PENDING  Incomplete  Expectorated Sputum Assessment w Gram Stain, Rflx to Resp Cult     Status: None   Collection Time: 07/20/21  9:20 PM   Specimen: Expectorated Sputum  Result Value Ref Range Status   Specimen Description   Final    EXPECTORATED SPUTUM Performed at [facility], [address] [place], [place] [address]    Special Requests   Final    NONE Performed at [facility], [address], [place], [place] [address]    Sputum evaluation   Final     THIS SPECIMEN IS ACCEPTABLE FOR SPUTUM CULTURE Performed at [facility], [place] [address], [place], [place] [address]    Report Status 07/21/2021 FINAL  Final  Culture, Respiratory w Gram Stain     Status: None (Preliminary result)   Collection Time: 07/20/21  9:20 PM  Result Value Ref Range Status   Specimen Description EXPECTORATED SPUTUM  Final   Special Requests NONE Reflexed from [id number]  Final   Gram Stain   Final    MODERATE WBC PRESENT, PREDOMINANTLY PMN RARE GRAM POSITIVE COCCI IN PAIRS RARE GRAM NEGATIVE RODS    Culture   Final    CULTURE REINCUBATED FOR BETTER GROWTH Performed at [facility], [place] [address], [place],  [address]    Report Status PENDING  Incomplete    Labs: CBC: Recent Labs  Lab 07/20/21 1300 07/21/21 0512 07/22/21 0500  WBC 31.0* 26.0* 12.6*  NEUTROABS 27.7*  --   --   HGB 12.3 11.0* 9.9*  HCT 36.5 32.8* 31.5*  MCV 104.3* 104.8* 104.7*  PLT 239 235 062   Basic Metabolic Panel: Recent Labs  Lab 07/20/21 1300 07/21/21 0512 07/22/21 0500  NA 132* 135 136  K 4.2 3.7 3.1*  CL 101 106 109  CO2 19* 18* 15*  GLUCOSE 84 92 93  BUN 39* 30* 21  CREATININE 1.71* 1.53* 1.30*  CALCIUM 9.4 8.7* 8.6*   Liver Function Tests: Recent Labs  Lab 07/20/21 1300  AST 19  ALT 18  ALKPHOS 114  BILITOT 2.2*  PROT 6.5  ALBUMIN 3.0*   CBG: No results for input(s): GLUCAP in the last 168 hours.  Discharge time spent: greater than 30 minutes.  Signed: Barton Dubois, MD [facility] 07/22/2021

## 2021-07-23 LAB — CULTURE, RESPIRATORY W GRAM STAIN: Culture: NORMAL

## 2021-07-24 DIAGNOSIS — M1712 Unilateral primary osteoarthritis, left knee: Secondary | ICD-10-CM | POA: Diagnosis not present

## 2021-07-24 DIAGNOSIS — M25562 Pain in left knee: Secondary | ICD-10-CM | POA: Diagnosis not present

## 2021-07-24 DIAGNOSIS — M25552 Pain in left hip: Secondary | ICD-10-CM | POA: Diagnosis not present

## 2021-07-24 DIAGNOSIS — M25551 Pain in right hip: Secondary | ICD-10-CM | POA: Diagnosis not present

## 2021-07-25 LAB — CULTURE, BLOOD (ROUTINE X 2)
Culture: NO GROWTH
Culture: NO GROWTH
Special Requests: ADEQUATE
Special Requests: ADEQUATE

## 2021-07-31 DIAGNOSIS — M1712 Unilateral primary osteoarthritis, left knee: Secondary | ICD-10-CM | POA: Diagnosis not present

## 2021-07-31 DIAGNOSIS — E063 Autoimmune thyroiditis: Secondary | ICD-10-CM | POA: Diagnosis not present

## 2021-07-31 DIAGNOSIS — N183 Chronic kidney disease, stage 3 unspecified: Secondary | ICD-10-CM | POA: Diagnosis not present

## 2021-07-31 DIAGNOSIS — M25552 Pain in left hip: Secondary | ICD-10-CM | POA: Diagnosis not present

## 2021-08-03 DIAGNOSIS — N1832 Chronic kidney disease, stage 3b: Secondary | ICD-10-CM | POA: Diagnosis not present

## 2021-08-05 ENCOUNTER — Telehealth: Payer: Medicare Other | Admitting: Cardiology

## 2021-08-05 ENCOUNTER — Encounter (HOSPITAL_COMMUNITY): Payer: Self-pay

## 2021-08-05 ENCOUNTER — Encounter: Payer: Self-pay | Admitting: Cardiology

## 2021-08-05 ENCOUNTER — Telehealth: Payer: Self-pay | Admitting: *Deleted

## 2021-08-05 ENCOUNTER — Emergency Department (HOSPITAL_COMMUNITY)
Admission: EM | Admit: 2021-08-05 | Discharge: 2021-08-05 | Disposition: A | Discharge status: Home / Self Care | Payer: Medicare Other | Attending: Emergency Medicine | Admitting: Emergency Medicine

## 2021-08-05 ENCOUNTER — Emergency Department (HOSPITAL_COMMUNITY): Payer: Medicare Other

## 2021-08-05 ENCOUNTER — Other Ambulatory Visit: Payer: Self-pay

## 2021-08-05 DIAGNOSIS — I10 Essential (primary) hypertension: Secondary | ICD-10-CM

## 2021-08-05 DIAGNOSIS — Z7902 Long term (current) use of antithrombotics/antiplatelets: Secondary | ICD-10-CM | POA: Insufficient documentation

## 2021-08-05 DIAGNOSIS — J449 Chronic obstructive pulmonary disease, unspecified: Secondary | ICD-10-CM | POA: Diagnosis not present

## 2021-08-05 DIAGNOSIS — Z7982 Long term (current) use of aspirin: Secondary | ICD-10-CM | POA: Insufficient documentation

## 2021-08-05 DIAGNOSIS — R0602 Shortness of breath: Secondary | ICD-10-CM | POA: Diagnosis not present

## 2021-08-05 DIAGNOSIS — R918 Other nonspecific abnormal finding of lung field: Secondary | ICD-10-CM | POA: Diagnosis not present

## 2021-08-05 LAB — BASIC METABOLIC PANEL
Anion gap: 11 (ref 5–15)
BUN: 25 mg/dL — ABNORMAL HIGH (ref 8–23)
CO2: 23 mmol/L (ref 22–32)
Calcium: 9.9 mg/dL (ref 8.9–10.3)
Chloride: 102 mmol/L (ref 98–111)
Creatinine, Ser: 1.19 mg/dL — ABNORMAL HIGH (ref 0.44–1.00)
GFR, Estimated: 45 mL/min — ABNORMAL LOW (ref >60)
Glucose, Bld: 94 mg/dL (ref 70–99)
Potassium: 4 mmol/L (ref 3.5–5.1)
Sodium: 136 mmol/L (ref 135–145)

## 2021-08-05 LAB — CBC
HCT: 39.5 % (ref 36.0–46.0)
Hemoglobin: 13 g/dL (ref 12.0–15.0)
MCH: 35 pg — ABNORMAL HIGH (ref 26.0–34.0)
MCHC: 32.9 g/dL (ref 30.0–36.0)
MCV: 106.5 fL — ABNORMAL HIGH (ref 80.0–100.0)
Platelets: 308 10*3/uL (ref 150–400)
RBC: 3.71 MIL/uL — ABNORMAL LOW (ref 3.87–5.11)
RDW: 14.1 % (ref 11.5–15.5)
WBC: 6.8 10*3/uL (ref 4.0–10.5)
nRBC: 0 % (ref 0.0–0.2)

## 2021-08-05 LAB — TROPONIN I (HIGH SENSITIVITY): Troponin I (High Sensitivity): 10 ng/L (ref <18)

## 2021-08-05 MED ORDER — HYDRALAZINE HCL 25 MG PO TABS
50.0000 mg | ORAL_TABLET | Freq: Once | ORAL | Status: AC
  Administered 2021-08-05: 50 mg via ORAL
  Filled 2021-08-05: qty 2

## 2021-08-05 MED ORDER — LORAZEPAM 0.5 MG PO TABS
0.5000 mg | ORAL_TABLET | Freq: Once | ORAL | Status: DC
  Filled 2021-08-05: qty 1

## 2021-08-05 MED ORDER — HYDRALAZINE HCL 25 MG PO TABS: 25.0000 mg | ORAL_TABLET | Freq: Three times a day (TID) | ORAL | 0 refills | Status: DC

## 2021-08-05 NOTE — Telephone Encounter (Signed)
Pt c/o BP issue: STAT if pt c/o blurred vision, one-sided weakness or slurred speech ?  ?1. What are your last 5 BP readings? 190/97, 168/79, 174/77, 182/84, 189/80, 206/93-this morning ?  ?2. Are you having any other symptoms (ex. Dizziness, headache, blurred vision, passed out)? Chest pains last bight ?  ?3. What is your BP issue? Blood pressure is up ?

## 2021-08-05 NOTE — ED Provider Notes (Signed)
[facility] EMERGENCY DEPARTMENT Provider Note   CSN: [id number] Arrival date & time: 08/05/21  1322     History  Chief Complaint  Patient presents with   Hypertension    Allison Morris is a 86 y.o. female.   Hypertension   This patient is an 86 year old female, she is treated for hypertension with Bystolic, she does not take any other blood pressure medicines, she states that she has been followed by her family doctor and cardiology for this high blood pressure in the past.  She last saw Dr. Riley Kill recently but states that her blood pressure medicines were not altered at that time, when she checked it today as she does every day it was around 200/100, she called her cardiologist who asked her to come to the ER for evaluation.  She denies chest pain, she states that she may be a little short of breath but not too bad, she has not had any swelling edema or difficulty urinating and has no numbness weakness changes in her vision or headaches.  She has no difficulty speaking, she has not had palpitations, she has not missed any doses of her medications, she has not been taking any stimulant or over-the-counter medications recently.  Home Medications Prior to Admission medications   Medication Sig Start Date End Date Taking? Authorizing Provider  hydrALAZINE (APRESOLINE) 25 MG tablet Take 1 tablet (25 mg total) by mouth 3 (three) times daily. 08/05/21 09/04/21 Yes Noemi Chapel, MD  acetaminophen (TYLENOL) 500 MG tablet Take 500 mg by mouth every 6 (six) hours as needed for mild pain or moderate pain.     [provider]  aspirin EC 81 MG EC tablet Take 1 tablet (81 mg total) by mouth daily. 11/15/15   Orvan Falconer, MD  Biotin 10000 MCG TABS Take 10,000 mcg by mouth daily.    [provider]  Cholecalciferol (VITAMIN D3) 1000 units CAPS Take 1,000 Units by mouth daily.    [provider]  clobetasol cream (TEMOVATE) 3.01 % Apply 1 application topically daily as needed  (for irritation).     [provider]  clopidogrel (PLAVIX) 75 MG tablet Take 1 tablet (75 mg total) by mouth daily. 11/15/15   Orvan Falconer, MD  colchicine 0.6 MG tablet Take 0.5 tablets (0.3 mg total) by mouth daily. 07/22/21   Barton Dubois, MD  fexofenadine (ALLEGRA) 180 MG tablet Take 180 mg by mouth daily.    [provider]  Flax Oil-Fish Oil-Borage Oil (FISH-FLAX-BORAGE) CAPS Take 1 capsule by mouth 2 (two) times daily.    [provider]  fluticasone (FLONASE) 50 MCG/ACT nasal spray Place 2 sprays into both nostrils daily as needed for allergies.    [provider]  Glucosamine-Chondroit-Vit C-Mn (GLUCOSAMINE 1500 COMPLEX PO) Take 1 tablet by mouth daily.     [provider]  guaiFENesin-dextromethorphan (ROBITUSSIN DM) 100-10 MG/5ML syrup Take 5 mLs by mouth every 4 (four) hours as needed for cough. 07/22/21   Barton Dubois, MD  HYDROcodone-acetaminophen (NORCO/VICODIN) 5-325 MG tablet Take 1 tablet by mouth every 6 (six) hours as needed. 06/03/21   Noemi Chapel, MD  nebivolol (BYSTOLIC) 2.5 MG tablet Take 1 tablet (2.5 mg total) by mouth daily as needed (if bp is over 180/165). 06/09/21   Satira Sark, MD  pantoprazole (PROTONIX) 40 MG tablet Take 40 mg by mouth daily.     [provider]  Polyethyl Glycol-Propyl Glycol (SYSTANE OP) Place 1 drop into both eyes daily as  needed (dry eyes).    [provider]  trolamine salicylate (ASPERCREME) 10 % cream Apply 1 application topically as needed for muscle pain.    [provider]  vitamin C (ASCORBIC ACID) 500 MG tablet Take 500 mg by mouth 2 (two) times daily.    [provider]      Allergies    Amlodipine, Biaxin [clarithromycin], Doxycycline, Estrogens, Felodipine, Labetalol, Lisinopril, Losartan, Macrodantin [nitrofurantoin], Statins, Ceftin [cefuroxime], Chlorthalidone, Ciprofloxacin, and Sulfa antibiotics    Review of Systems   Review of Systems  All  other systems reviewed and are negative.  Physical Exam Updated Vital Signs BP (!) 185/76    Pulse 97    Temp 97.7 F (36.5 C) (Oral)    Resp 18    Wt 50 kg    SpO2 99%    BMI 17.79 kg/m  Physical Exam Vitals and nursing note reviewed.  Constitutional:      General: She is not in acute distress.    Appearance: She is well-developed.  HENT:     Head: Normocephalic and atraumatic.     Mouth/Throat:     Pharynx: No oropharyngeal exudate.  Eyes:     General: No scleral icterus.       Right eye: No discharge.        Left eye: No discharge.     Conjunctiva/sclera: Conjunctivae normal.     Pupils: Pupils are equal, round, and reactive to light.  Neck:     Thyroid: No thyromegaly.     Vascular: No JVD.  Cardiovascular:     Rate and Rhythm: Normal rate and regular rhythm.     Heart sounds: Normal heart sounds. No murmur heard.   No friction rub. No gallop.  Pulmonary:     Effort: Pulmonary effort is normal. No respiratory distress.     Breath sounds: Normal breath sounds. No wheezing or rales.  Abdominal:     General: Bowel sounds are normal. There is no distension.     Palpations: Abdomen is soft. There is no mass.     Tenderness: There is no abdominal tenderness.  Musculoskeletal:        General: No tenderness. Normal range of motion.     Cervical back: Normal range of motion and neck supple.     Right lower leg: No edema.     Left lower leg: No edema.  Lymphadenopathy:     Cervical: No cervical adenopathy.  Skin:    General: Skin is warm and dry.     Findings: No erythema or rash.  Neurological:     General: No focal deficit present.     Mental Status: She is alert.     Coordination: Coordination normal.  Psychiatric:        Behavior: Behavior normal.    ED Results / Procedures / Treatments   Labs (all labs ordered are listed, but only abnormal results are displayed) Labs Reviewed  BASIC METABOLIC PANEL - Abnormal; Notable for the following components:      Result  Value   BUN 25 (*)    Creatinine, Ser 1.19 (*)    GFR, Estimated 45 (*)    All other components within normal limits  CBC - Abnormal; Notable for the following components:   RBC 3.71 (*)    MCV 106.5 (*)    MCH 35.0 (*)    All other components within normal limits  TROPONIN I (HIGH SENSITIVITY)    EKG EKG Interpretation  Date/Time:  Wednesday August 05 2021 13:52:51 EST Ventricular Rate:  72 PR Interval:  133 QRS Duration: 85 QT Interval:  420 QTC Calculation: 460 R Axis:   56 Text Interpretation: Sinus rhythm Normal ECG Confirmed by Noemi Chapel [phone]) on 08/05/2021 2:23:12 PM  Radiology DG Chest 2 View  Result Date: 08/05/2021 CLINICAL DATA:  Short of breath EXAM: CHEST - 2 VIEW COMPARISON:  07/20/2021 FINDINGS: COPD. Interval improvement in infiltrate in the right upper lobe and lingula. No new infiltrate or effusion. Negative for heart failure. IMPRESSION: COPD with improvement in bilateral infiltrates. Electronically Signed   By: Franchot Gallo M.D.   On: 08/05/2021 14:13    Procedures Procedures    Medications Ordered in ED Medications  hydrALAZINE (APRESOLINE) tablet 50 mg (50 mg Oral Given 08/05/21 1506)    ED Course/ Medical Decision Making/ A&P                           Medical Decision Making Amount and/or Complexity of Data Reviewed Labs: ordered. Radiology: ordered.  Risk Prescription drug management.   This patient actually appears quite well despite her blood pressure which is currently 191/103.  She is not tachycardic or tachypneic she is not diaphoretic and has no nausea or vomiting.  We will check some basic labs and add some blood pressure medication as she is recently running higher than it is in a safe range.  She is concerned because of her history of not responding to multiple different antihypertensive medications.  This includes allergies to amlodipine, felodipine, labetalol, lisinopril, chlorthalidone, and losartan.  May start with  hydralazine  BP improved with meds - pt asymptopmatic - labs reassuring.  Vitals:   08/05/21 1339 08/05/21 1400 08/05/21 1430 08/05/21 1500  BP:  (!) 207/88 (!) 194/76 (!) 185/76  Pulse:  66 63 97  Resp:  16 18 18   Temp:      TempSrc:      SpO2:  99% 98% 99%  Weight: 50 kg              Final Clinical Impression(s) / ED Diagnoses Final diagnoses:  Primary hypertension    Rx / DC Orders ED Discharge Orders          Ordered    hydrALAZINE (APRESOLINE) 25 MG tablet  3 times daily        08/05/21 1531              Noemi Chapel, MD 08/05/21 [phone]

## 2021-08-05 NOTE — Telephone Encounter (Signed)
Pt c/o BP issue: STAT if pt c/o blurred vision, one-sided weakness or slurred speech  1. What are your last 5 BP readings? 190/97, 168/79, 174/77, 182/84, 189/80, 206/93-this morning  2. Are you having any other symptoms (ex. Dizziness, headache, blurred vision, passed out)? Chest pains last bight  3. What is your BP issue? Blood pressure is up

## 2021-08-05 NOTE — Telephone Encounter (Signed)
This encounter was created in error - please disregard.

## 2021-08-05 NOTE — Telephone Encounter (Signed)
Allison Morris M ?  ?SW ?   9:53 AM ?Note ?Pt c/o BP issue: STAT if pt c/o blurred vision, one-sided weakness or slurred speech ?  ?1. What are your last 5 BP readings? 190/97, 168/79, 174/77, 182/84, 189/80, 206/93-this morning ?  ?2. Are you having any other symptoms (ex. Dizziness, headache, blurred vision, passed out)? Chest pains last bight ?  ?3. What is your BP issue? Blood pressure is up ?   ?  ? ?

## 2021-08-05 NOTE — ED Notes (Signed)
Pt and pt family member concerned about pt BP dropping after receiving BP medication at 1506. Dr. Melina Copa made aware that pt/pt family member concerned plus d/c instructions states for pt to take hydralazine  which pt/pt family member do not feel comfortable doing after possible reaction. Per Dr. Melina Copa pt should not take BP medication if she does not feel comfortable and pt should f/u with cardiologist as soon as possible to evaluate pt BP. Pt and pt family member made aware that pt should not take BP medication if not comfortable and f/u with her doctor as soon as possible.  ?

## 2021-08-05 NOTE — Telephone Encounter (Signed)
? ?  Pt c/o medication issue: ? ?1. Name of Medication: nebivolol (BYSTOLIC) 2.5 MG tablet ? ?2. How are you currently taking this medication (dosage and times per day)? Take 1 tablet (2.5 mg total) by mouth daily as needed (if bp is over 180/165). ? ?3. Are you having a reaction (difficulty breathing--STAT)? no ? ?4. What is your medication issue? Pt was seen in the ER due to high blood pressure, right now pt is on 2.5 mg of Bistolic as needed but only has enough to take one a day. Pts daughter feels as if her mom needs a higher dosage and would like further advice.. please advise ? ? ? ? ?

## 2021-08-05 NOTE — ED Triage Notes (Signed)
Pt reports her bp was elevated at home x 2 weeks and saw her PCP but medications were not adjusted. Pt reports sob x 2 hours. Denies chest pain. ?

## 2021-08-05 NOTE — Telephone Encounter (Signed)
Reports checking blood pressures on different days since recent hospital discharge ?Reports checking home blood pressures with consistency (same time, same arm, same cuff) ?Reports home bp cuff has been checked for accuracy ?Last BP checked at 9:00 am today ?Medications reviewed ?Reports taking bystolic 10 mg yesterday and has not taken any bystolic today ?Says she is no longer taking robitussin dm and last dose was on 02/15 ?Saw PCP Friday 07/31/21 and was told her BP was elevated-says she is unsure of the number ?Denies dizziness, chest pain, headache, blurred vision ?Speech is clear ?BP rechecked while on phone 218/97 ?Advised to go to ED now for an evaluation ?Verbalized understanding of plan ? ?

## 2021-08-05 NOTE — Discharge Instructions (Signed)
Start taking hydralazine 3 times daily for the next couple of weeks and have Dr. Domenic Polite recheck your blood pressure in 2 weeks ? ?ER for worsening symptoms. ?

## 2021-08-05 NOTE — ED Notes (Addendum)
This nurse presented to room 12 to discharge patient and pt reports she is having a reaction to the medication. Complains of flushing feeling hot all over, and shortness of breath. Pt is tachypneic 24-32 bpm. Vitals rechecked. Dr. Sabra Heck notified. ?

## 2021-08-06 NOTE — Telephone Encounter (Signed)
Patient in ED last evening (see epic note).  Did not feel comfortable taking the Hydralazine that was given by ED due to possible reaction.  Please advise.  ?

## 2021-08-10 DIAGNOSIS — D631 Anemia in chronic kidney disease: Secondary | ICD-10-CM | POA: Diagnosis not present

## 2021-08-10 DIAGNOSIS — N2581 Secondary hyperparathyroidism of renal origin: Secondary | ICD-10-CM | POA: Diagnosis not present

## 2021-08-10 DIAGNOSIS — N1832 Chronic kidney disease, stage 3b: Secondary | ICD-10-CM | POA: Diagnosis not present

## 2021-08-10 DIAGNOSIS — I129 Hypertensive chronic kidney disease with stage 1 through stage 4 chronic kidney disease, or unspecified chronic kidney disease: Secondary | ICD-10-CM | POA: Diagnosis not present

## 2021-08-10 NOTE — Telephone Encounter (Signed)
Patient would like to speak to nurse before she makes appt with the hypertension clinic.  ?

## 2021-08-10 NOTE — Telephone Encounter (Signed)
Patient notified.  States that she will need to discuss with her daughter first.  She will call us back afterwards.  ?

## 2021-08-11 NOTE — Telephone Encounter (Addendum)
Spoke with daughter Blair Hailey) - states that mom had a lot going on recently.  Had been getting a lot of steroids and antibiotics lately & feels like some of that may have contributed to her elevated blood pressure.  Stated the Hydralazine caused her to feel hot, flushed, red faced & made her feel like she was going to pass out.   ? ?162/90  ?166/77 ?152/80 ?179/87 ?167/85 ?142/83 ? ?HR running 70-82 ? ?Daughter states that the readings do seem like they are coming back down each day.  States patient does notice that BP seems to be more elevated at night.  She has continued to use the Bysotlic 2.'5mg'$  prn BP over 180/165.  Daughter would like to see about adjusting her current medication to see if this helps before going to HTN clinic.  If you still feel that she needs to go there, they will.  ?

## 2021-08-11 NOTE — Addendum Note (Signed)
Addended by: Laurine Blazer on: 08/11/2021 05:37 PM ? ? Modules accepted: Orders ? ?

## 2021-08-13 MED ORDER — NEBIVOLOL HCL 2.5 MG PO TABS: 2.5000 mg | ORAL_TABLET | Freq: Every day | ORAL | 6 refills | Status: DC

## 2021-08-13 NOTE — Telephone Encounter (Addendum)
Patient notified and verbalized understanding.  She will start taking the Bystolic 2.'5mg'$  daily.  New prescription sent to [facility] now.  Asked her to keep log of readings x 2 wks and call / mychart message them to [place] for provider review.  She verbalized understanding.   ?

## 2021-08-13 NOTE — Addendum Note (Signed)
Addended by: Laurine Blazer on: 08/13/2021 04:35 PM ? ? Modules accepted: Orders ? ?

## 2021-08-20 ENCOUNTER — Other Ambulatory Visit (HOSPITAL_COMMUNITY): Payer: Self-pay | Admitting: Internal Medicine

## 2021-08-20 ENCOUNTER — Ambulatory Visit (HOSPITAL_COMMUNITY)
Admission: RE | Admit: 2021-08-20 | Discharge: 2021-08-20 | Disposition: A | Discharge status: Home / Self Care | Payer: Medicare Other | Source: Ambulatory Visit | Attending: Internal Medicine | Admitting: Internal Medicine

## 2021-08-20 ENCOUNTER — Other Ambulatory Visit: Payer: Self-pay

## 2021-08-20 DIAGNOSIS — N183 Chronic kidney disease, stage 3 unspecified: Secondary | ICD-10-CM | POA: Diagnosis not present

## 2021-08-20 DIAGNOSIS — R079 Chest pain, unspecified: Secondary | ICD-10-CM | POA: Diagnosis not present

## 2021-08-20 DIAGNOSIS — I10 Essential (primary) hypertension: Secondary | ICD-10-CM | POA: Diagnosis not present

## 2021-08-20 DIAGNOSIS — M1991 Primary osteoarthritis, unspecified site: Secondary | ICD-10-CM | POA: Diagnosis not present

## 2021-08-24 ENCOUNTER — Telehealth: Payer: Self-pay | Admitting: Cardiology

## 2021-08-24 ENCOUNTER — Other Ambulatory Visit: Payer: Self-pay | Admitting: *Deleted

## 2021-08-24 MED ORDER — NEBIVOLOL HCL 2.5 MG PO TABS
2.5000 mg | ORAL_TABLET | Freq: Every day | ORAL | 0 refills | Status: DC
Start: 2021-08-24 — End: 2021-12-24

## 2021-08-24 NOTE — Telephone Encounter (Signed)
Patient informed and verbalized understanding of plan. 

## 2021-08-24 NOTE — Telephone Encounter (Signed)
Last filled bystolic 2.5 mg #95 on 39/67/28. Says she has one pill left. Advised that #7 would be sent to her pharmacy to pay out of pocket price. Verbalized understanading.  ?

## 2021-08-24 NOTE — Telephone Encounter (Signed)
Pt c/o BP issue: STAT if pt c/o blurred vision, one-sided weakness or slurred speech ? ?1. What are your last 5 BP readings?  ?08/14/21 09:01 am: 171/90 HR 75 ?   10:25 pm: 178/87 HR 69 10 mg Bystolic  ?00/76/22 63:33 am: 179/98 HR 73 ?    10:09 pm: 137/74 HR 66 5 mg Bystolic ?54/56/25 63:89 am: 144/77 HR 76 ?    10:24 pm: 373/42 HR 63 no Bystolic ?87/68/11 57:26 am: 139/75 HR 63  ?   10:20 pm:  203/55 HR 69 no Bystolic ?97/41/63 84:53 am:  123/74 HR 81 ?   10:35 pm: 165/87 HR 75 2.5 mg Bystolic in AM  ?64/68/03 09:30 am: 166/74 HR 66 ?    10:06 pm:  140/74 HR 71 2.5 mg Bystolic in AM ?21/22/48 25:00 am: 146/80 HR 74  ?   10:26 pm: 167/82 HR 79 2.5 mg Bystolic AM and PM  ?37/04/88 08:40 am: 174/78 HR 65  ?   10:55 pm: 184/83 HR 69 2.5 mg AM , 5 mg mid day, 2.5 mg at night ?08/22/21 09:28 am: 169/78 HR 66  ?   10:41 pm: 162/74 HR 70 2.5 mg in AM  ?08/23/21 09:15 am: 132/73 HR 72  ?   10:34 pm: 141/71 HR 69 2.5 mg in AM  ? ?2. Are you having any other symptoms (ex. Dizziness, headache, blurred vision, passed out)? no ? ?3. What is your BP issue? Edd Fabian RN told the patient to take her BP and send them to her via Mychart but the patient does not have a computer  ?

## 2021-08-26 ENCOUNTER — Other Ambulatory Visit (HOSPITAL_COMMUNITY): Payer: Self-pay | Admitting: Internal Medicine

## 2021-08-26 ENCOUNTER — Other Ambulatory Visit: Payer: Self-pay | Admitting: Internal Medicine

## 2021-08-26 DIAGNOSIS — R9389 Abnormal findings on diagnostic imaging of other specified body structures: Secondary | ICD-10-CM

## 2021-09-04 DIAGNOSIS — M25552 Pain in left hip: Secondary | ICD-10-CM | POA: Diagnosis not present

## 2021-09-04 DIAGNOSIS — M1991 Primary osteoarthritis, unspecified site: Secondary | ICD-10-CM | POA: Diagnosis not present

## 2021-09-04 DIAGNOSIS — I129 Hypertensive chronic kidney disease with stage 1 through stage 4 chronic kidney disease, or unspecified chronic kidney disease: Secondary | ICD-10-CM | POA: Diagnosis not present

## 2021-09-04 DIAGNOSIS — E063 Autoimmune thyroiditis: Secondary | ICD-10-CM | POA: Diagnosis not present

## 2021-09-04 DIAGNOSIS — N183 Chronic kidney disease, stage 3 unspecified: Secondary | ICD-10-CM | POA: Diagnosis not present

## 2021-09-04 DIAGNOSIS — M1712 Unilateral primary osteoarthritis, left knee: Secondary | ICD-10-CM | POA: Diagnosis not present

## 2021-09-04 DIAGNOSIS — M25551 Pain in right hip: Secondary | ICD-10-CM | POA: Diagnosis not present

## 2021-09-04 DIAGNOSIS — M25562 Pain in left knee: Secondary | ICD-10-CM | POA: Diagnosis not present

## 2021-09-22 ENCOUNTER — Ambulatory Visit (HOSPITAL_COMMUNITY)
Admission: RE | Admit: 2021-09-22 | Discharge: 2021-09-22 | Disposition: A | Discharge status: Home / Self Care | Payer: Medicare Other | Source: Ambulatory Visit | Attending: Internal Medicine | Admitting: Internal Medicine

## 2021-09-22 DIAGNOSIS — J479 Bronchiectasis, uncomplicated: Secondary | ICD-10-CM | POA: Diagnosis not present

## 2021-09-22 DIAGNOSIS — J181 Lobar pneumonia, unspecified organism: Secondary | ICD-10-CM | POA: Diagnosis not present

## 2021-09-22 DIAGNOSIS — I700 Atherosclerosis of aorta: Secondary | ICD-10-CM | POA: Diagnosis not present

## 2021-09-22 DIAGNOSIS — J439 Emphysema, unspecified: Secondary | ICD-10-CM | POA: Diagnosis not present

## 2021-09-22 DIAGNOSIS — R9389 Abnormal findings on diagnostic imaging of other specified body structures: Secondary | ICD-10-CM | POA: Diagnosis not present

## 2021-09-22 MED ORDER — IOHEXOL 300 MG/ML  SOLN: 100.0000 mL | Freq: Once | INTRAMUSCULAR | Status: DC | PRN

## 2021-09-24 ENCOUNTER — Other Ambulatory Visit (HOSPITAL_COMMUNITY): Payer: Self-pay | Admitting: Internal Medicine

## 2021-09-24 ENCOUNTER — Other Ambulatory Visit: Payer: Self-pay | Admitting: Internal Medicine

## 2021-09-24 DIAGNOSIS — E041 Nontoxic single thyroid nodule: Secondary | ICD-10-CM

## 2021-09-28 DIAGNOSIS — L570 Actinic keratosis: Secondary | ICD-10-CM | POA: Diagnosis not present

## 2021-09-28 DIAGNOSIS — I872 Venous insufficiency (chronic) (peripheral): Secondary | ICD-10-CM | POA: Diagnosis not present

## 2021-09-28 DIAGNOSIS — L218 Other seborrheic dermatitis: Secondary | ICD-10-CM | POA: Diagnosis not present

## 2021-09-28 DIAGNOSIS — X32XXXD Exposure to sunlight, subsequent encounter: Secondary | ICD-10-CM | POA: Diagnosis not present

## 2021-10-02 ENCOUNTER — Ambulatory Visit (HOSPITAL_COMMUNITY)
Admission: RE | Admit: 2021-10-02 | Discharge: 2021-10-02 | Disposition: A | Discharge status: Home / Self Care | Payer: Medicare Other | Source: Ambulatory Visit | Attending: Internal Medicine | Admitting: Internal Medicine

## 2021-10-02 DIAGNOSIS — E041 Nontoxic single thyroid nodule: Secondary | ICD-10-CM | POA: Insufficient documentation

## 2021-10-14 DIAGNOSIS — Z681 Body mass index (BMI) 19 or less, adult: Secondary | ICD-10-CM | POA: Diagnosis not present

## 2021-10-14 DIAGNOSIS — I129 Hypertensive chronic kidney disease with stage 1 through stage 4 chronic kidney disease, or unspecified chronic kidney disease: Secondary | ICD-10-CM | POA: Diagnosis not present

## 2021-10-14 DIAGNOSIS — K1121 Acute sialoadenitis: Secondary | ICD-10-CM | POA: Diagnosis not present

## 2021-10-14 DIAGNOSIS — I10 Essential (primary) hypertension: Secondary | ICD-10-CM | POA: Diagnosis not present

## 2021-10-20 DIAGNOSIS — Z681 Body mass index (BMI) 19 or less, adult: Secondary | ICD-10-CM | POA: Diagnosis not present

## 2021-10-20 DIAGNOSIS — K1121 Acute sialoadenitis: Secondary | ICD-10-CM | POA: Diagnosis not present

## 2021-10-20 DIAGNOSIS — E063 Autoimmune thyroiditis: Secondary | ICD-10-CM | POA: Diagnosis not present

## 2021-10-26 DIAGNOSIS — M25562 Pain in left knee: Secondary | ICD-10-CM | POA: Diagnosis not present

## 2021-10-26 DIAGNOSIS — M25561 Pain in right knee: Secondary | ICD-10-CM | POA: Diagnosis not present

## 2021-10-26 DIAGNOSIS — M1712 Unilateral primary osteoarthritis, left knee: Secondary | ICD-10-CM | POA: Diagnosis not present

## 2021-11-04 DIAGNOSIS — E063 Autoimmune thyroiditis: Secondary | ICD-10-CM | POA: Diagnosis not present

## 2021-11-04 DIAGNOSIS — M1991 Primary osteoarthritis, unspecified site: Secondary | ICD-10-CM | POA: Diagnosis not present

## 2021-11-04 DIAGNOSIS — I129 Hypertensive chronic kidney disease with stage 1 through stage 4 chronic kidney disease, or unspecified chronic kidney disease: Secondary | ICD-10-CM | POA: Diagnosis not present

## 2021-11-04 DIAGNOSIS — N183 Chronic kidney disease, stage 3 unspecified: Secondary | ICD-10-CM | POA: Diagnosis not present

## 2021-11-06 DIAGNOSIS — M1711 Unilateral primary osteoarthritis, right knee: Secondary | ICD-10-CM | POA: Diagnosis not present

## 2021-11-09 ENCOUNTER — Other Ambulatory Visit (HOSPITAL_COMMUNITY): Payer: Self-pay | Admitting: Internal Medicine

## 2021-11-09 DIAGNOSIS — N183 Chronic kidney disease, stage 3 unspecified: Secondary | ICD-10-CM | POA: Diagnosis not present

## 2021-11-09 DIAGNOSIS — R220 Localized swelling, mass and lump, head: Secondary | ICD-10-CM | POA: Diagnosis not present

## 2021-11-09 DIAGNOSIS — I129 Hypertensive chronic kidney disease with stage 1 through stage 4 chronic kidney disease, or unspecified chronic kidney disease: Secondary | ICD-10-CM | POA: Diagnosis not present

## 2021-11-09 DIAGNOSIS — Z681 Body mass index (BMI) 19 or less, adult: Secondary | ICD-10-CM | POA: Diagnosis not present

## 2021-11-09 DIAGNOSIS — R9389 Abnormal findings on diagnostic imaging of other specified body structures: Secondary | ICD-10-CM

## 2021-11-11 ENCOUNTER — Ambulatory Visit (HOSPITAL_COMMUNITY)
Admission: RE | Admit: 2021-11-11 | Discharge: 2021-11-11 | Disposition: A | Discharge status: Home / Self Care | Payer: Medicare Other | Source: Ambulatory Visit | Attending: Internal Medicine | Admitting: Internal Medicine

## 2021-11-11 DIAGNOSIS — J189 Pneumonia, unspecified organism: Secondary | ICD-10-CM | POA: Diagnosis not present

## 2021-11-11 DIAGNOSIS — R9389 Abnormal findings on diagnostic imaging of other specified body structures: Secondary | ICD-10-CM | POA: Diagnosis not present

## 2021-11-13 DIAGNOSIS — M1711 Unilateral primary osteoarthritis, right knee: Secondary | ICD-10-CM | POA: Diagnosis not present

## 2021-11-20 DIAGNOSIS — M25562 Pain in left knee: Secondary | ICD-10-CM | POA: Diagnosis not present

## 2021-11-23 ENCOUNTER — Other Ambulatory Visit: Payer: Self-pay | Admitting: Otolaryngology

## 2021-11-23 ENCOUNTER — Other Ambulatory Visit (HOSPITAL_COMMUNITY): Payer: Self-pay | Admitting: Otolaryngology

## 2021-11-23 DIAGNOSIS — R221 Localized swelling, mass and lump, neck: Secondary | ICD-10-CM

## 2021-11-30 ENCOUNTER — Ambulatory Visit
Admission: RE | Admit: 2021-11-30 | Discharge: 2021-11-30 | Disposition: A | Discharge status: Home / Self Care | Payer: Medicare Other | Source: Ambulatory Visit | Attending: Family Medicine | Admitting: Family Medicine

## 2021-11-30 VITALS — BP 166/82 | HR 57 | Temp 97.9°F | Resp 18

## 2021-11-30 DIAGNOSIS — J22 Unspecified acute lower respiratory infection: Secondary | ICD-10-CM

## 2021-11-30 DIAGNOSIS — R062 Wheezing: Secondary | ICD-10-CM

## 2021-11-30 MED ORDER — GUAIFENESIN ER 600 MG PO TB12: 600.0000 mg | ORAL_TABLET | Freq: Two times a day (BID) | ORAL | 0 refills | Status: DC

## 2021-11-30 MED ORDER — AZITHROMYCIN 250 MG PO TABS: 250.0000 mg | ORAL_TABLET | Freq: Every day | ORAL | 0 refills | Status: DC

## 2021-11-30 MED ORDER — ALBUTEROL SULFATE HFA 108 (90 BASE) MCG/ACT IN AERS: 1.0000 | INHALATION_SPRAY | Freq: Four times a day (QID) | RESPIRATORY_TRACT | 0 refills | Status: DC | PRN

## 2021-11-30 NOTE — ED Provider Notes (Signed)
[facility]    CSN: [id number] Arrival date & time: 11/30/21  1214      History   Chief Complaint Chief Complaint  Patient presents with   Cough    Entered by patient   Appointment    1230    HPI Allison Morris is a 86 y.o. female.   Presenting today with 1 week history of progressively worsening hacking cough.  She states she is unable to get any mucus up and struggle to catch her breath for time this morning along with some mild wheezing.  She denies known fever, congestion, body aches, chills, chest pain, abdominal pain, nausea vomiting or diarrhea.  Not tried anything over-the-counter for symptoms.  Recently had pneumonia earlier this year.  No known sick contacts recently.   Past Medical History:  Diagnosis Date   Aortic dilatation (HCC)    a. echo 08/2017 showed EF 65-70%, grade 1 DD, mild AI, mildly dilated ascending aorta (43mm), mild MR, mild TR.    Aortic regurgitation    Arthritis    hands   CKD (chronic kidney disease), stage III (HCC)    Essential hypertension    GERD (gastroesophageal reflux disease)    History of DVT (deep vein thrombosis)    Left leg 1997    Hyperlipemia    Mild aortic insufficiency    Mild mitral regurgitation    Mild tricuspid insufficiency    Seasonal allergies    Patient Active Problem List   Diagnosis Date Noted   Pneumonia of both lungs due to infectious organism 07/20/2021   Acute respiratory failure with hypoxia (HCC) 07/20/2021   Stage 3b chronic kidney disease (CKD) (HCC) 07/20/2021   Mass of finger of right hand 05/30/2019   Osteoarthritis of both hands 05/30/2019   Orthostatic lightheadedness 12/11/2017   TIA (transient ischemic attack) 11/14/2015   Hypertension    Hyperlipemia    History of DVT (deep vein thrombosis)    Aortic dilatation [facility])     Past Surgical History:  Procedure Laterality Date   APPENDECTOMY     CARPAL TUNNEL RELEASE     IR RADIOLOGIST EVAL & MGMT  01/21/2021   IR SACROPLASTY  BILATERAL  12/18/2020   MASS EXCISION Right 08/07/2019   Procedure: EXCISION CYST RIGHT INDEX AND RIGHT MIDDLE FINGERS WITH DEBRIDEMENT OF METACARPALPHALANGEAL JOINTS;  Surgeon: Cindee Salt, MD;  Location: [facility];  Service: Orthopedics;  Laterality: Right;   TONSILLECTOMY      OB History   No obstetric history on file.    Home Medications    Prior to Admission medications   Medication Sig Start Date End Date Taking? Authorizing Provider  albuterol (VENTOLIN HFA) 108 (90 Base) MCG/ACT inhaler Inhale 1-2 puffs into the lungs every 6 (six) hours as needed for wheezing or shortness of breath. 11/30/21  Yes Particia Nearing, PA-C  azithromycin (ZITHROMAX) 250 MG tablet Take 1 tablet (250 mg total) by mouth daily. Take first 2 tablets together, then 1 every day until finished. 11/30/21  Yes Particia Nearing, PA-C  guaiFENesin (MUCINEX) 600 MG 12 hr tablet Take 1 tablet (600 mg total) by mouth 2 (two) times daily. 11/30/21  Yes Particia Nearing, PA-C  acetaminophen (TYLENOL) 500 MG tablet Take 500 mg by mouth every 6 (six) hours as needed for mild pain or moderate pain.     [provider]  aspirin EC 81 MG EC tablet Take 1 tablet (81 mg total) by mouth daily. 11/15/15  Houston Siren, MD  Biotin 16109 MCG TABS Take 10,000 mcg by mouth daily.    [provider]  Cholecalciferol (VITAMIN D3) 1000 units CAPS Take 1,000 Units by mouth daily.    [provider]  clobetasol cream (TEMOVATE) 0.05 % Apply 1 application topically daily as needed (for irritation).     [provider]  clopidogrel (PLAVIX) 75 MG tablet Take 1 tablet (75 mg total) by mouth daily. 11/15/15   Houston Siren, MD  colchicine 0.6 MG tablet Take 0.5 tablets (0.3 mg total) by mouth daily. 07/22/21   Vassie Loll, MD  fexofenadine (ALLEGRA) 180 MG tablet Take 180 mg by mouth daily.    [provider]  Flax Oil-Fish Oil-Borage Oil (FISH-FLAX-BORAGE) CAPS Take 1 capsule  by mouth 2 (two) times daily.    [provider]  fluticasone (FLONASE) 50 MCG/ACT nasal spray Place 2 sprays into both nostrils daily as needed for allergies.    [provider]  Glucosamine-Chondroit-Vit C-Mn (GLUCOSAMINE 1500 COMPLEX PO) Take 1 tablet by mouth daily.     [provider]  guaiFENesin-dextromethorphan (ROBITUSSIN DM) 100-10 MG/5ML syrup Take 5 mLs by mouth every 4 (four) hours as needed for cough. 07/22/21   Vassie Loll, MD  HYDROcodone-acetaminophen (NORCO/VICODIN) 5-325 MG tablet Take 1 tablet by mouth every 6 (six) hours as needed. 06/03/21   Eber Hong, MD  nebivolol (BYSTOLIC) 2.5 MG tablet Take 1 tablet (2.5 mg total) by mouth daily. 08/13/21   Jonelle Sidle, MD  nebivolol (BYSTOLIC) 2.5 MG tablet Take 1 tablet (2.5 mg total) by mouth daily for 7 days. 08/24/21 08/31/21  Jonelle Sidle, MD  pantoprazole (PROTONIX) 40 MG tablet Take 40 mg by mouth daily.     [provider]  Polyethyl Glycol-Propyl Glycol (SYSTANE OP) Place 1 drop into both eyes daily as needed (dry eyes).    [provider]  trolamine salicylate (ASPERCREME) 10 % cream Apply 1 application topically as needed for muscle pain.    [provider]  vitamin C (ASCORBIC ACID) 500 MG tablet Take 500 mg by mouth 2 (two) times daily.    [provider]   Family History Family History  Problem Relation Age of Onset   Aneurysm Sister    Valvular heart disease Sister    Aneurysm Paternal Grandfather    Heart attack Paternal Grandfather    Social History Social History   Tobacco Use   Smoking status: Never   Smokeless tobacco: Never  Vaping Use   Vaping Use: Never used  Substance Use Topics   Alcohol use: No    Alcohol/week: 0.0 standard drinks of alcohol   Drug use: No     Allergies   Amlodipine, Biaxin [clarithromycin], Doxycycline, Estrogens, Felodipine, Labetalol, Lisinopril, Losartan, Macrodantin [nitrofurantoin], Statins,  Ceftin [cefuroxime], Chlorthalidone, Ciprofloxacin, and Sulfa antibiotics   Review of Systems Review of Systems Per HPI  Physical Exam Triage Vital Signs ED Triage Vitals  Enc Vitals Group     BP 11/30/21 1231 (!) 166/82     Pulse Rate 11/30/21 1231 (!) 57     Resp 11/30/21 1231 18     Temp 11/30/21 1231 97.9 F (36.6 C)     Temp Source 11/30/21 1231 Oral     SpO2 11/30/21 1231 98 %     Weight --      Height --      Head Circumference --      Peak Flow --      Pain  Score 11/30/21 1230 0     Pain Loc --      Pain Edu? --      Excl. in GC? --    No data found.  Updated Vital Signs BP (!) 166/82 (BP Location: Right Arm)   Pulse (!) 57   Temp 97.9 F (36.6 C) (Oral)   Resp 18   SpO2 98%   Visual Acuity Right Eye Distance:   Left Eye Distance:   Bilateral Distance:    Right Eye Near:   Left Eye Near:    Bilateral Near:     Physical Exam Vitals and nursing note reviewed.  Constitutional:      Appearance: Normal appearance. She is not ill-appearing.  HENT:     Head: Atraumatic.     Right Ear: Tympanic membrane normal.     Left Ear: Tympanic membrane normal.     Nose: Nose normal.     Mouth/Throat:     Mouth: Mucous membranes are moist.     Pharynx: Posterior oropharyngeal erythema present. No oropharyngeal exudate.  Eyes:     Extraocular Movements: Extraocular movements intact.     Conjunctiva/sclera: Conjunctivae normal.  Cardiovascular:     Rate and Rhythm: Normal rate and regular rhythm.     Heart sounds: Normal heart sounds.  Pulmonary:     Effort: Pulmonary effort is normal. No respiratory distress.     Breath sounds: Wheezing present. No rales.     Comments: Trace wheezes bilaterally Musculoskeletal:        General: Normal range of motion.     Cervical back: Normal range of motion and neck supple.  Skin:    General: Skin is warm and dry.  Neurological:     Mental Status: She is alert and oriented to person, place, and time.  Psychiatric:         Mood and Affect: Mood normal.        Thought Content: Thought content normal.        Judgment: Judgment normal.      UC Treatments / Results  Labs (all labs ordered are listed, but only abnormal results are displayed) Labs Reviewed - No data to display  EKG   Radiology No results found.  Procedures Procedures (including critical care time)  Medications Ordered in UC Medications - No data to display  Initial Impression / Assessment and Plan / UC Course  I have reviewed the triage vital signs and the nursing notes.  Pertinent labs & imaging results that were available during my care of the patient were reviewed by me and considered in my medical decision making (see chart for details).     Oxygen saturation 98% on room air and patient is breathing comfortably, speaking in full sentences.  Does have trace wheezes, suspect bronchitis but given her history of pneumonia and duration and worsening course of symptoms will cover with azithromycin as well as treat symptomatically with Mucinex, albuterol inhaler.  Discussed supportive care and return precautions.  Final Clinical Impressions(s) / UC Diagnoses   Final diagnoses:  Lower resp. tract infection  Wheezing   Discharge Instructions   None    ED Prescriptions     Medication Sig Dispense Auth. Provider   guaiFENesin (MUCINEX) 600 MG 12 hr tablet Take 1 tablet (600 mg total) by mouth 2 (two) times daily. 30 tablet Particia Nearing, [place]   azithromycin (ZITHROMAX) 250 MG tablet Take 1 tablet (250 mg total) by mouth daily. Take first 2 tablets together,  then 1 every day until finished. 6 tablet Particia Nearing, [place]   albuterol (VENTOLIN HFA) 108 (90 Base) MCG/ACT inhaler Inhale 1-2 puffs into the lungs every 6 (six) hours as needed for wheezing or shortness of breath. 18 g Particia Nearing, [place]      PDMP not reviewed this encounter.   Particia Nearing, [place] 11/30/21 1317

## 2021-12-02 ENCOUNTER — Ambulatory Visit (HOSPITAL_COMMUNITY)
Admission: RE | Admit: 2021-12-02 | Discharge: 2021-12-02 | Disposition: A | Discharge status: Home / Self Care | Payer: Medicare Other | Source: Ambulatory Visit | Attending: Otolaryngology | Admitting: Otolaryngology

## 2021-12-02 DIAGNOSIS — R221 Localized swelling, mass and lump, neck: Secondary | ICD-10-CM | POA: Insufficient documentation

## 2021-12-17 ENCOUNTER — Ambulatory Visit (INDEPENDENT_AMBULATORY_CARE_PROVIDER_SITE_OTHER): Payer: Medicare Other

## 2021-12-17 DIAGNOSIS — I351 Nonrheumatic aortic (valve) insufficiency: Secondary | ICD-10-CM

## 2021-12-18 LAB — ECHOCARDIOGRAM COMPLETE
AR max vel: 2.04 cm2
AV Area VTI: 2.13 cm2
AV Area mean vel: 2.14 cm2
AV Mean grad: 2.9 mmHg
AV Peak grad: 6.1 mmHg
AV Vena cont: 0.21 cm
Ao pk vel: 1.24 m/s
Area-P 1/2: 2.42 cm2
Calc EF: 62.6 %
MV M vel: 2.95 m/s
MV Peak grad: 34.8 mmHg
P 1/2 time: 1227 msec
S' Lateral: 1.95 cm
Single Plane A2C EF: 62.4 %
Single Plane A4C EF: 63.3 %

## 2021-12-22 NOTE — Progress Notes (Signed)
Cardiology Office Note  Date: 12/24/2021   ID: Allison Morris, DOB [birth year], MRN [id number]  PCP:  Redmond School, MD  Cardiologist:  Rozann Lesches, MD Electrophysiologist:  None   Chief Complaint  Patient presents with   Cardiac follow-up    History of Present Illness: Allison Morris is an 86 y.o. female last seen in January.  She is here for a follow-up visit.  Reports no chest pain or increasing breathlessness with typical activities.  We went over her home blood pressure checks, still with fluctuating blood pressure and varying use of Bystolic.  She does take a pill in the morning, sometimes an additional 1 in the evening depending on her blood pressure.  We have tried various permutations of this and today I asked her to try to take Bystolic 2.5 mg twice daily on a consistent basis.  She has had numerous medication intolerances including Norvasc, Plendil, ACE inhibitor's and ARB's, chlorthalidone, spironolactone, and beta-blockers with the exception of Bystolic.  This has made blood pressure control quite challenging.  Recent follow-up echocardiogram revealed normal LVEF at 60 to 65%, mildly sclerotic aortic valve with mild aortic regurgitation, and stable dilatation of the ascending aorta at 43 mm.  We discussed these results today.  Past Medical History:  Diagnosis Date   Aortic dilatation ([place])    a. echo 08/2017 showed EF 65-70%, grade 1 DD, mild AI, mildly dilated ascending aorta (71m), mild MR, mild TR.    Aortic regurgitation    Arthritis    hands   CKD (chronic kidney disease), stage III (HCC)    Essential hypertension    GERD (gastroesophageal reflux disease)    History of DVT (deep vein thrombosis)    Left leg 1997    Hyperlipemia    Mild aortic insufficiency    Mild mitral regurgitation    Mild tricuspid insufficiency    Seasonal allergies     Past Surgical History:  Procedure Laterality Date   APPENDECTOMY     CARPAL TUNNEL RELEASE     IR  RADIOLOGIST EVAL & MGMT  01/21/2021   IR SACROPLASTY BILATERAL  12/18/2020   MASS EXCISION Right 08/07/2019   Procedure: EXCISION CYST RIGHT INDEX AND RIGHT MIDDLE FINGERS WITH DEBRIDEMENT OF METACARPALPHALANGEAL JOINTS;  Surgeon: KDaryll Brod MD;  [place] [facility]  Service: Orthopedics;  Laterality: Right;   TONSILLECTOMY      Current Outpatient Medications  Medication Sig Dispense Refill   acetaminophen (TYLENOL) 500 MG tablet Take 500 mg by mouth every 6 (six) hours as needed for mild pain or moderate pain.      albuterol (VENTOLIN HFA) 108 (90 Base) MCG/ACT inhaler Inhale 1-2 puffs into the lungs every 6 (six) hours as needed for wheezing or shortness of breath. 18 g 0   aspirin EC 81 MG EC tablet Take 1 tablet (81 mg total) by mouth daily. 100 tablet 1   Biotin 10000 MCG TABS Take 10,000 mcg by mouth daily.     Cholecalciferol (VITAMIN D3) 1000 units CAPS Take 1,000 Units by mouth daily.     clobetasol cream (TEMOVATE) 09.70% Apply 1 application topically daily as needed (for irritation).      clopidogrel (PLAVIX) 75 MG tablet Take 1 tablet (75 mg total) by mouth daily. 30 tablet 2   colchicine 0.6 MG tablet Take 0.5 tablets (0.3 mg total) by mouth daily.     fexofenadine (ALLEGRA) 180 MG tablet Take 180 mg by mouth daily.  Flax Oil-Fish Oil-Borage Oil (FISH-FLAX-BORAGE) CAPS Take 1 capsule by mouth 2 (two) times daily.     fluticasone (FLONASE) 50 MCG/ACT nasal spray Place 2 sprays into both nostrils daily as needed for allergies.     Glucosamine-Chondroit-Vit C-Mn (GLUCOSAMINE 1500 COMPLEX PO) Take 1 tablet by mouth daily.      guaiFENesin (MUCINEX) 600 MG 12 hr tablet Take 1 tablet (600 mg total) by mouth 2 (two) times daily. 30 tablet 0   HYDROcodone-acetaminophen (NORCO/VICODIN) 5-325 MG tablet Take 1 tablet by mouth every 6 (six) hours as needed. 6 tablet 0   pantoprazole (PROTONIX) 40 MG tablet Take 40 mg by mouth daily.      Polyethyl Glycol-Propyl Glycol  (SYSTANE OP) Place 1 drop into both eyes daily as needed (dry eyes).     trolamine salicylate (ASPERCREME) 10 % cream Apply 1 application topically as needed for muscle pain.     vitamin C (ASCORBIC ACID) 500 MG tablet Take 500 mg by mouth 2 (two) times daily.     nebivolol (BYSTOLIC) 2.5 MG tablet Take 1 tablet (2.5 mg total) by mouth 2 (two) times daily. 180 tablet 1   No current facility-administered medications for this visit.   Allergies:  Amlodipine, Biaxin [clarithromycin], Doxycycline, Estrogens, Felodipine, Labetalol, Lisinopril, Losartan, Macrodantin [nitrofurantoin], Statins, Ceftin [cefuroxime], Chlorthalidone, Ciprofloxacin, and Sulfa antibiotics   ROS:  No syncope.  Physical Exam: VS:  BP (!) 164/82   Pulse 64   Ht '5\' 5"'$  (1.651 m)   Wt 109 lb 9.6 oz (49.7 kg)   BMI 18.24 kg/m , BMI Body mass index is 18.24 kg/m.  Wt Readings from Last 3 Encounters:  12/24/21 109 lb 9.6 oz (49.7 kg)  08/05/21 110 lb 3.7 oz (50 kg)  07/20/21 110 lb (49.9 kg)    General: Patient appears comfortable at rest. HEENT: Conjunctiva and lids normal, oropharynx clear. Neck: Supple, no elevated JVP or carotid bruits, no thyromegaly. Lungs: Clear to auscultation, nonlabored breathing at rest. Cardiac: Regular rate and rhythm, no S3, 1/6 systolic murmur. Extremities: No pitting edema.  ECG:  An ECG dated 08/05/2021 was personally reviewed today and demonstrated:  Sinus rhythm.  Recent Labwork: 07/20/2021: ALT 18; AST 19; B Natriuretic Peptide 354.0 08/05/2021: BUN 25; Creatinine, Ser 1.19; Hemoglobin 13.0; Platelets 308; Potassium 4.0; Sodium 136     Component Value Date/Time   CHOL 248 (H) 11/15/2015 0542   TRIG 87 11/15/2015 0542   HDL 48 11/15/2015 0542   CHOLHDL 5.2 11/15/2015 0542   VLDL 17 11/15/2015 0542   LDLCALC 183 (H) 11/15/2015 0542    Other Studies Reviewed Today:  Echocardiogram 12/17/2021:  1. Left ventricular ejection fraction, by estimation, is 60 to 65%. The  left  ventricle has normal function. The left ventricle has no regional  wall motion abnormalities. Left ventricular diastolic parameters are  consistent with Grade I diastolic  dysfunction (impaired relaxation). Elevated left atrial pressure. The  average left ventricular global longitudinal strain is -17.8 %. The global  longitudinal strain is normal.   2. Right ventricular systolic function is normal. The right ventricular  size is normal. Tricuspid regurgitation signal is inadequate for assessing  PA pressure.   3. The mitral valve is normal in structure. No evidence of mitral valve  regurgitation. No evidence of mitral stenosis.   4. The aortic valve is tricuspid. There is mild calcification of the  aortic valve. There is mild thickening of the aortic valve. Aortic valve  regurgitation is mild. No aortic stenosis  is present.   5. Aortic dilatation noted. There is moderate dilatation of the ascending  aorta, measuring 43 mm.   6. The inferior vena cava is normal in size with greater than 50%  respiratory variability, suggesting right atrial pressure of 3 mmHg.   Assessment and Plan:  1.  Essential hypertension, not optimally controlled in the setting of multiple medication intolerances.  We will try Bystolic 2.5 mg twice daily on a more consistent basis.  She has had trouble on higher doses, but this is one of the few medications that she has actually tolerated.  I did go over her home blood pressure checks with her today.  2.  Aortic root dilatation and aortic regurgitation.  Follow-up echocardiogram shows ascending aorta 43 mm which is relatively stable with only mild aortic regurgitation.  Continue observation.  Medication Adjustments/Labs and Tests Ordered: Current medicines are reviewed at length with the patient today.  Concerns regarding medicines are outlined above.   Tests Ordered: No orders of the defined types were placed in this encounter.   Medication Changes: Meds ordered  this encounter  Medications   nebivolol (BYSTOLIC) 2.5 MG tablet    Sig: Take 1 tablet (2.5 mg total) by mouth 2 (two) times daily.    Dispense:  180 tablet    Refill:  1    Dose increased 7/20    Disposition:  Follow up  6 months.  Signed, Satira Sark, MD, [facility] 12/24/2021 10:42 AM    [facility] [place], [place], [place] [address] Phone: [phone]; Fax: [phone]

## 2021-12-24 ENCOUNTER — Ambulatory Visit: Payer: Medicare Other | Admitting: Cardiology

## 2021-12-24 ENCOUNTER — Encounter: Payer: Self-pay | Admitting: Cardiology

## 2021-12-24 VITALS — BP 164/82 | HR 64 | Ht 65.0 in | Wt 109.6 lb

## 2021-12-24 DIAGNOSIS — I10 Essential (primary) hypertension: Secondary | ICD-10-CM

## 2021-12-24 DIAGNOSIS — I351 Nonrheumatic aortic (valve) insufficiency: Secondary | ICD-10-CM | POA: Diagnosis not present

## 2021-12-24 MED ORDER — NEBIVOLOL HCL 2.5 MG PO TABS: 2.5000 mg | ORAL_TABLET | Freq: Two times a day (BID) | ORAL | 1 refills | Status: DC

## 2021-12-24 NOTE — Patient Instructions (Addendum)
Medication Instructions:  Your physician has recommended you make the following change in your medication: Take bystolic 2.5 mg twice a day Continue all other medications as directed  Labwork: none  Testing/Procedures: none  Follow-Up:  Your physician recommends that you schedule a follow-up appointment in: 6 months  Any Other Special Instructions Will Be Listed Below (If Applicable).  If you need a refill on your cardiac medications before your next appointment, please call your pharmacy.

## 2022-01-22 ENCOUNTER — Telehealth: Payer: Self-pay | Admitting: Cardiology

## 2022-01-22 NOTE — Telephone Encounter (Signed)
Spoke with pt who will take another Bystolic 2.5 mg now and monitor BP's.

## 2022-01-22 NOTE — Telephone Encounter (Signed)
    By review of Dr. Myles Gip last note, she should be taking Bystolic 2.'5mg'$  twice daily instead of as needed. Only taking it as needed can cause rebound hypertension which is consistent with her recent readings. Would take another 2.'5mg'$  now (HR is low but has been consistent).   She has been intolerant to Amlodipine, Plendil, ACE-I, ARB, Chlorthalidone and Spironolactone. If BP remains elevated, would likely need to add Hydralazine.   Signed, Erma Heritage, PA-C 01/22/2022, 10:04 AM

## 2022-01-22 NOTE — Telephone Encounter (Signed)
Pt c/o BP issue: STAT if pt c/o blurred vision, one-sided weakness or slurred speech  1. What are your last 5 BP readings?   8/17: 147/73           190's/89           194/96           150/72 54        2. Are you having any other symptoms (ex. Dizziness, headache, blurred vision, passed out)?  No   3. What is your BP issue? Patient states her BP has been fluctuating. She states sometimes she has been adjusting her Bystolic.

## 2022-01-22 NOTE — Telephone Encounter (Signed)
Pt report she is taking Bystolic as needed. She took a total of 10 mg on yesterday. She reports taking Bystolic 2.5 mg this morning because her BP was 185/94, Hr 52. Pt denies dizziness. She does c/o headache on yesterday. Current BP 191/94 Hr 55. Pt states that she takes an extra pill when her BP is around 200. Please advise.

## 2022-01-23 ENCOUNTER — Telehealth: Payer: Self-pay | Admitting: Cardiology

## 2022-01-23 ENCOUNTER — Other Ambulatory Visit: Payer: Self-pay | Admitting: Cardiology

## 2022-01-23 ENCOUNTER — Telehealth: Payer: Self-pay | Admitting: Student in an Organized Health Care Education/Training Program

## 2022-01-23 MED ORDER — HYDRALAZINE HCL 25 MG PO TABS: 25.0000 mg | ORAL_TABLET | Freq: Two times a day (BID) | ORAL | 1 refills | Status: DC

## 2022-01-23 NOTE — Telephone Encounter (Signed)
Patient called on call answering service today for the evaluation of poorly controlled BP with SBP >274mHg. Appears that she contacted our team yesterday with elevated BP at which time she was instructed to take an additional Bystolic 2.'5mg'$ . She reports this helped some however she is having issues with rebound HTN. She has tried many medications for HTN and is intolerant to Amlodipine, Plendil, ACE-I, ARB, Chlorthalidone and Spironolactone. Plan at that time was to start Hydralazine if BP remained elevated.   On call today, she reports BP at 204/90 with associated HA. It has been several hours since her AM dose of Bystolic therefore should be at target goal. Will send in Hydralazine '25mg'$  BID to start today. She is rather reluctant to start this. The other option would be for her to come to an ED for IV therapy which she does not want to do. I will send to primary cardiology team. She may need a closer follow up appointment.   JKathyrn DrownNP-C Structural Heart Team  Pager: [phone]: [phone]

## 2022-01-23 NOTE — Telephone Encounter (Signed)
Patient called to report that he BP dropped "too low" and that her "sinuses feel dry". She is worried that these are side effects of the hydralazine she took this afternoon. She called earlier today to report having HTN. She was prescribed hydralazine 25 mg BID. 3 hrs after taking the hydralazine she states that her BP dropped from 204 to 188 and she is concerned. I reassured her that the hydralazine is supposed to make her blood pressure lower and that the change in SBP isn't dramatic. I also shared that her dry sinuses are unlikely to be the result of the medication. She denies having any chest pain or syncope. I told her to take all of her prescribed BP medications this evening. If she remains hypertensive despite taking her medications and develops symptoms such as chest pain, SOB, HA or vision changes to come to the ED for treatment. She verbalized an understanding.

## 2022-01-25 ENCOUNTER — Telehealth: Payer: Self-pay | Admitting: Cardiology

## 2022-01-25 DIAGNOSIS — R221 Localized swelling, mass and lump, neck: Secondary | ICD-10-CM | POA: Diagnosis not present

## 2022-01-25 NOTE — Telephone Encounter (Signed)
Pt c/o BP issue: STAT if pt c/o blurred vision, one-sided weakness or slurred speech  1. What are your last 5 BP readings?  175/93 193/89 182/82   2. Are you having any other symptoms (ex. Dizziness, headache, blurred vision, passed out)? No  3. What is your BP issue? Pt states that she called this weekend with issues and would like to get into see someone in regards to her BP issues. Requesting call back.

## 2022-01-25 NOTE — Telephone Encounter (Signed)
Reports elevated BP's. Denies dizziness, chest pain or SOB Reports headache and thinks hydralazine is the cause of it Says she saw her ENT doctor this morning for sinus problems Medications reviewed Reports taking bystolic twice daily since 12/24/2021 Reports taking hydralazine 12.5 mg daily Explained the importance of taking her medications the way they are prescribed to get her BP adequately controlled Advised to take all medications as prescribed, check BP regularly for the next 2 weeks and contact our office with the readings Verbalized understanding of plan

## 2022-01-26 ENCOUNTER — Other Ambulatory Visit (HOSPITAL_COMMUNITY): Payer: Self-pay | Admitting: Otolaryngology

## 2022-01-26 DIAGNOSIS — R221 Localized swelling, mass and lump, neck: Secondary | ICD-10-CM

## 2022-01-27 ENCOUNTER — Telehealth: Payer: Self-pay | Admitting: Cardiology

## 2022-01-27 NOTE — Telephone Encounter (Signed)
Pt c/o medication issue:  1. Name of Medication:   hydrALAZINE (APRESOLINE) 25 MG tablet    2. How are you currently taking this medication (dosage and times per day)? Take 1 tablet (25 mg total) by mouth 2 (two) times daily for 120 doses.  3. Are you having a reaction (difficulty breathing--STAT)? No  4. What is your medication issue? Pt states that medication is causing her to have bad headaches and BP continues to run high. Please advise

## 2022-01-27 NOTE — Telephone Encounter (Signed)
Would defer to Dr Murlean Hark MD

## 2022-01-27 NOTE — Telephone Encounter (Signed)
BP 164/85 '@10'$  am & 170/90 '@1'$ :30 pm Headache rated 9/10. Took tylenol 500 mg that did not relieve symptoms Denies blurred vision, dizziness, chest pain or sob. Has seen ENT for sinus problems and has appointment with opthalmologist next week Says she thinks hydralazine is causing headache and watery eyes Says her pharmacist told her that headache can be caused by hydralazine Advised that message would be sent to provider

## 2022-01-28 NOTE — Telephone Encounter (Signed)
Patient informed and verbalized understanding of plan. Of note, patient did state that she did not take her hydralazine yesterday and says her headache is a little better today.

## 2022-02-03 DIAGNOSIS — H02824 Cysts of left upper eyelid: Secondary | ICD-10-CM | POA: Diagnosis not present

## 2022-02-03 DIAGNOSIS — H18513 Endothelial corneal dystrophy, bilateral: Secondary | ICD-10-CM | POA: Diagnosis not present

## 2022-02-03 DIAGNOSIS — H524 Presbyopia: Secondary | ICD-10-CM | POA: Diagnosis not present

## 2022-02-03 DIAGNOSIS — H04123 Dry eye syndrome of bilateral lacrimal glands: Secondary | ICD-10-CM | POA: Diagnosis not present

## 2022-02-03 DIAGNOSIS — H0102A Squamous blepharitis right eye, upper and lower eyelids: Secondary | ICD-10-CM | POA: Diagnosis not present

## 2022-02-05 ENCOUNTER — Telehealth (HOSPITAL_BASED_OUTPATIENT_CLINIC_OR_DEPARTMENT_OTHER): Payer: Self-pay | Admitting: *Deleted

## 2022-02-05 ENCOUNTER — Encounter (HOSPITAL_BASED_OUTPATIENT_CLINIC_OR_DEPARTMENT_OTHER): Payer: Self-pay | Admitting: Cardiovascular Disease

## 2022-02-05 ENCOUNTER — Ambulatory Visit (HOSPITAL_BASED_OUTPATIENT_CLINIC_OR_DEPARTMENT_OTHER): Payer: Medicare Other | Admitting: Cardiovascular Disease

## 2022-02-05 ENCOUNTER — Other Ambulatory Visit (HOSPITAL_BASED_OUTPATIENT_CLINIC_OR_DEPARTMENT_OTHER): Payer: Self-pay | Admitting: *Deleted

## 2022-02-05 VITALS — BP 217/83 | HR 54 | Ht 65.0 in | Wt 111.9 lb

## 2022-02-05 DIAGNOSIS — N1832 Chronic kidney disease, stage 3b: Secondary | ICD-10-CM

## 2022-02-05 DIAGNOSIS — I10 Essential (primary) hypertension: Secondary | ICD-10-CM

## 2022-02-05 DIAGNOSIS — I77819 Aortic ectasia, unspecified site: Secondary | ICD-10-CM

## 2022-02-05 DIAGNOSIS — Z5181 Encounter for therapeutic drug level monitoring: Secondary | ICD-10-CM

## 2022-02-05 DIAGNOSIS — I160 Hypertensive urgency: Secondary | ICD-10-CM

## 2022-02-05 HISTORY — DX: Hypertensive urgency: I16.0

## 2022-02-05 MED ORDER — NEBIVOLOL HCL 2.5 MG PO TABS: 2.5000 mg | ORAL_TABLET | Freq: Two times a day (BID) | ORAL | 3 refills | Status: DC

## 2022-02-05 NOTE — Progress Notes (Signed)
Cardiology Office Note   Date:  02/05/2022   ID:  Allison Morris, DOB May 07, 1936, MRN [id number]  PCP:  Redmond School, MD  Cardiologist:   Skeet Latch, MD   No chief complaint on file.     History of Present Illness: Allison Morris is a 86 y.o. female with hypertension, aortic aneurysm, hyperlipidemia, and TIA who is being seen today for Allison evaluation of hypertension at Allison request of Dr. Domenic Polite.  Allison Morris is struggled with labile blood pressures and has been intolerant to many medicines as listed below.  Most recently Allison Morris has been using nebivolol on an as-needed basis.  He recommended that Allison Morris start taking nebivolol 2.5 mg twice daily on a more consistent basis. Since then Allison Morris called Allison office after taking hydralazine.  Allison Morris was concerned that her blood pressure dropped too low.  Did drop from 2 4 systolic to 196.  Allison Morris was reassured that this was a safe drop in blood pressure and in fact her blood pressure should be even lower. Allison Morris had an echo 12/2021 that revealed LVEF 60 to 65% with grade 1 diastolic dysfunction.  Her ascending aorta was 4.3 cm, which is stable from prior.  Her nuclear stress test in 2019 was negative for ischemia.  Allison Morris, reports a history of struggling with blood pressure control for approximately 50 years, with increased difficulty in Allison past five years. Allison Morris has tried multiple medications, initially experiencing coughing as a side effect and then trying two or three others without success. Allison Morris is currently on Bystolic 2.5 mg twice a day, which has been taken for years at 5 mg once a day.  Allison Morris sometimes takes an extra dose for very elevated BP, but then runs out too early and can't get it refilled.   Allison Morris is quite active and denies experiencing chest pain or pressure, and reports no difficulty breathing. Allison Morris maintains a mixed diet of home-cooked meals and takeout, monitors salt intake due to high blood pressure, and avoids certain foods due to  gout. Allison Morris consumes minimal caffeine and alcohol and takes few over-Allison-counter medications. Allison Morris has stage 3 kidney disease, which Allison Morris has been told may be related to Allison blood pressure issues.  Allison Morris denies snoring and generally feels rested in Allison morning. Thyroid function was assessed with an ultrasound and lab work in January, with reportedly normal results. Recently, Allison Morris experienced a sudden episode of facial flushing and high blood pressure, measuring 205/98.  Home BP ranges 110-230/52-94  Prior antihypertensives: Diastolic  ACE inhibitors ARB Chlorthalidone Spironolactone Amlodipine Felodipine Hydralazine Labetalol Lisinopril losartan  Past Medical History:  Diagnosis Date   Aortic dilatation ([place])    a. echo 08/2017 showed EF 65-70%, grade 1 DD, mild AI, mildly dilated ascending aorta (37m), mild MR, mild TR.    Aortic regurgitation    Arthritis    hands   CKD (chronic kidney disease), stage III ([facility]CC)    Essential hypertension    GERD (gastroesophageal reflux disease)    History of DVT (deep vein thrombosis)    Left leg 1997    Hyperlipemia    Hypertensive urgency 02/05/2022   Mild aortic insufficiency    Mild mitral regurgitation    Mild tricuspid insufficiency    Seasonal allergies     Past Surgical History:  Procedure Laterality Date   APPENDECTOMY     CARPAL TUNNEL RELEASE     IR RADIOLOGIST EVAL & MGMT  01/21/2021   IR SACROPLASTY BILATERAL  12/18/2020   MASS EXCISION Right 08/07/2019   Procedure: EXCISION CYST RIGHT INDEX AND RIGHT MIDDLE FINGERS WITH DEBRIDEMENT OF METACARPALPHALANGEAL JOINTS;  Surgeon: Daryll Brod, MD;  Location: [facility];  Service: Orthopedics;  Laterality: Right;   TONSILLECTOMY       Current Outpatient Medications  Medication Sig Dispense Refill   acetaminophen (TYLENOL) 500 MG tablet Take 500 mg by mouth every 6 (six) hours as needed for mild pain or moderate pain.      albuterol (VENTOLIN HFA) 108 (90 Base)  MCG/ACT inhaler Inhale 1-2 puffs into Allison lungs every 6 (six) hours as needed for wheezing or shortness of breath. 18 g 0   aspirin EC 81 MG EC tablet Take 1 tablet (81 mg total) by mouth daily. 100 tablet 1   Biotin 10000 MCG TABS Take 10,000 mcg by mouth daily.     Cholecalciferol (VITAMIN D3) 1000 units CAPS Take 1,000 Units by mouth daily.     clobetasol cream (TEMOVATE) 0.62 % Apply 1 application topically daily as needed (for irritation).      clopidogrel (PLAVIX) 75 MG tablet Take 1 tablet (75 mg total) by mouth daily. 30 tablet 2   colchicine 0.6 MG tablet Take 0.5 tablets (0.3 mg total) by mouth daily.     fexofenadine (ALLEGRA) 180 MG tablet Take 180 mg by mouth daily.     Flax Oil-Fish Oil-Borage Oil (FISH-FLAX-BORAGE) CAPS Take 1 capsule by mouth 2 (two) times daily.     fluticasone (FLONASE) 50 MCG/ACT nasal spray Place 2 sprays into both nostrils daily as needed for allergies.     Glucosamine-Chondroit-Vit C-Mn (GLUCOSAMINE 1500 COMPLEX PO) Take 1 tablet by mouth daily.      guaiFENesin (MUCINEX) 600 MG 12 hr tablet Take 1 tablet (600 mg total) by mouth 2 (two) times daily. 30 tablet 0   HYDROcodone-acetaminophen (NORCO/VICODIN) 5-325 MG tablet Take 1 tablet by mouth every 6 (six) hours as needed. 6 tablet 0   pantoprazole (PROTONIX) 40 MG tablet Take 40 mg by mouth daily.      Polyethyl Glycol-Propyl Glycol (SYSTANE OP) Place 1 drop into both eyes daily as needed (dry eyes).     sacubitril-valsartan (ENTRESTO) 24-26 MG Take 1 tablet by mouth as directed. TAKE 1/2 TABLET TWICE A DAY     trolamine salicylate (ASPERCREME) 10 % cream Apply 1 application topically as needed for muscle pain.     vitamin C (ASCORBIC ACID) 500 MG tablet Take 500 mg by mouth 2 (two) times daily.     nebivolol (BYSTOLIC) 2.5 MG tablet Take 1 tablet (2.5 mg total) by mouth 2 (two) times daily. OK TO TAKE AN EXTRA TABLET AS NEEDED FOR BLOOD PRESSURE ABOVE 150 40 tablet 3   No current facility-administered  medications for this visit.    Allergies:   Amlodipine, Biaxin [clarithromycin], Doxycycline, Estrogens, Felodipine, Hydralazine, Labetalol, Lisinopril, Losartan, Macrodantin [nitrofurantoin], Statins, Ceftin [cefuroxime], Chlorthalidone, Ciprofloxacin, and Sulfa antibiotics    Social History:  Allison Morris  reports that Allison Morris has never smoked. Allison Morris has never used smokeless tobacco. Allison Morris reports that Allison Morris does not drink alcohol and does not use drugs.   Family History:  Allison Morris's family history includes Aneurysm in her paternal grandfather and sister; Heart attack in her paternal grandfather; Valvular heart disease in her sister.    ROS:  Please see Allison history of present illness.   Otherwise, review of systems are positive for none.   All other systems are reviewed and negative.  PHYSICAL EXAM: VS:  BP (!) 217/83 Comment: right arm  Pulse (!) 54   Ht '5\' 5"'$  (1.651 m)   Wt 111 lb 14.4 oz (50.8 kg)   BMI 18.62 kg/m  , BMI Body mass index is 18.62 kg/m. GENERAL:  Well appearing HEENT:  Pupils equal round and reactive, fundi not visualized, oral mucosa unremarkable NECK:  No jugular venous distention, waveform within normal limits, carotid upstroke brisk and symmetric, no bruits, no thyromegaly LUNGS:  Clear to auscultation bilaterally HEART:  RRR.  PMI not displaced or sustained,S1 and S2 within normal limits, no S3, no S4, no clicks, no rubs, no murmurs ABD:  Flat, positive bowel sounds normal in frequency in pitch, no bruits, no rebound, no guarding, no midline pulsatile mass, no hepatomegaly, no splenomegaly EXT:  2 plus pulses throughout, no edema, no cyanosis no clubbing SKIN:  No rashes no nodules NEURO:  Cranial nerves II through XII grossly intact, motor grossly intact throughout PSYCH:  Cognitively intact, oriented to person place and time    EKG:  EKG is not ordered today. N/a  Echo:  1. Left ventricular ejection fraction, by estimation, is 60 to 65%. Allison  left  ventricle has normal function. Allison left ventricle has no regional  wall motion abnormalities. Left ventricular diastolic parameters are  consistent with Grade I diastolic  dysfunction (impaired relaxation). Elevated left atrial pressure. Allison  average left ventricular global longitudinal strain is -17.8 %. Allison global  longitudinal strain is normal.   2. Right ventricular systolic function is normal. Allison right ventricular  size is normal. Tricuspid regurgitation signal is inadequate for assessing  PA pressure.   3. Allison mitral valve is normal in structure. No evidence of mitral valve  regurgitation. No evidence of mitral stenosis.   4. Allison aortic valve is tricuspid. There is mild calcification of Allison  aortic valve. There is mild thickening of Allison aortic valve. Aortic valve  regurgitation is mild. No aortic stenosis is present.   5. Aortic dilatation noted. There is moderate dilatation of Allison ascending  aorta, measuring 43 mm.   6. Allison inferior vena cava is normal in size with greater than 50%  respiratory variability, suggesting right atrial pressure of 3 mmHg.   Recent Labs: 07/20/2021: ALT 18; B Natriuretic Peptide 354.0 08/05/2021: BUN 25; Creatinine, Ser 1.19; Hemoglobin 13.0; Platelets 308; Potassium 4.0; Sodium 136    Lipid Panel    Component Value Date/Time   CHOL 248 (H) 11/15/2015 0542   TRIG 87 11/15/2015 0542   HDL 48 11/15/2015 0542   CHOLHDL 5.2 11/15/2015 0542   VLDL 17 11/15/2015 0542   LDLCALC 183 (H) 11/15/2015 0542      Wt Readings from Last 3 Encounters:  02/05/22 111 lb 14.4 oz (50.8 kg)  12/24/21 109 lb 9.6 oz (49.7 kg)  08/05/21 110 lb 3.7 oz (50 kg)      ASSESSMENT AND PLAN:  1. Hypertensive urgency:    - Morris has a long history of hypertension, with difficulty controlling blood pressure in Allison last five years.  Allison Morris has multiple medication intolerances.  BP is quite high today and Allison Morris is hesitant to try anything new.     - Currently on Bystolic 2.5  mg twice a day, with fluctuating blood pressure.    - Plan:        a. Check renin, aldosterone, plasma catecholamines, metanephrines, TSH        b. Perform an ultrasound of Allison renal arteries to rule  out blockages.        c. Prescribe a 24-hour blood pressure monitor to better understand blood pressure patterns.        d. Adjust Bystolic prescription to include additional tablets for blood pressure spikes.        e. Consider starting Allison Morris on Entresto, beginning with a sample of 24/'26mg'$ , half a pill twice a day.        f. Continue Bystolic as prescribed.        g. Schedule follow-up appointment in one month to assess blood pressure control and make any necessary adjustments. PharmD or APP  2. Stage 3 Kidney Disease:    - Possibly related to Allison Morris's hypertension.    - Plan:        a. Monitor kidney function through lab work.        b. Adding Entresto as above.        C. BMP in a week  3. Ascending aortic aneurysm: Stable on recent imaging.  BP control and beta blocker as above.   Current medicines are reviewed at length with Allison Morris today.  Allison Morris does not have concerns regarding medicines.  Allison following changes have been made:  add Entresto  Labs/ tests ordered today include:   Orders Placed This Encounter  Procedures   US RENAL ARTERY DUPLEX COMPLETE   TSH   Catecholamines, fractionated, plasma   Metanephrines, plasma   Aldosterone + renin activity w/ ratio   Basic metabolic panel   CAR 25ZD BLOOD PRESSURE MONITOR     Disposition:   FU with Kaeleb Emond C. Oval Linsey, MD, [facility] in 4 months.  PharmD or APP in 1 month.     Signed, Malvina Schadler C. Oval Linsey, MD, [facility]  02/05/2022 10:59 AM    [place]

## 2022-02-05 NOTE — Patient Instructions (Addendum)
Medication Instructions:  START ENTRESTO 24/26 MG 1/2 TABLET TWICE A DAY    Labwork: RENIN/ALDOSTERONE/METANEPHRINES/CATACHOLAMINES/TSH TODAY   BMET IN 1 WEEK, OK TO HAVE AT DR MCDOWELL'S OFFICE    Testing/Procedures: Your physician has requested that you have a renal artery duplex. During this test, an ultrasound is used to evaluate blood flow to the kidneys. Allow one hour for this exam. Do not eat after midnight the day before and avoid carbonated beverages. Take your medications as you usually do. AT [place]   24 HOUR BLOOD PRESSURE MONITOR  THE OFFICE WILL CALL YOU TO SCHEDULE    Follow-Up: PHARM D 1 MONTH    Special Instructions:   MONITOR YOUR BLOOD PRESSURE TWICE A DAY, LOG IN THE BOOK PROVIDED. BRING THE BOOK AND YOUR BLOOD PRESSURE MACHINE TO YOUR FOLLOW UP IN 1 MONTH   DASH Eating Plan DASH stands for "Dietary Approaches to Stop Hypertension." The DASH eating plan is a healthy eating plan that has been shown to reduce high blood pressure (hypertension). It may also reduce your risk for type 2 diabetes, heart disease, and stroke. The DASH eating plan may also help with weight loss. What are tips for following this plan?  General guidelines Avoid eating more than 2,300 mg (milligrams) of salt (sodium) a day. If you have hypertension, you may need to reduce your sodium intake to 1,500 mg a day. Limit alcohol intake to no more than 1 drink a day for nonpregnant women and 2 drinks a day for men. One drink equals 12 oz of beer, 5 oz of wine, or 1 oz of hard liquor. Work with your health care provider to maintain a healthy body weight or to lose weight. Ask what an ideal weight is for you. Get at least 30 minutes of exercise that causes your heart to beat faster (aerobic exercise) most days of the week. Activities may include walking, swimming, or biking. Work with your health care provider or diet and nutrition specialist (dietitian) to adjust your eating plan to your  individual calorie needs. Reading food labels  Check food labels for the amount of sodium per serving. Choose foods with less than 5 percent of the Daily Value of sodium. Generally, foods with less than 300 mg of sodium per serving fit into this eating plan. To find whole grains, look for the word "whole" as the first word in the ingredient list. Shopping Buy products labeled as "low-sodium" or "no salt added." Buy fresh foods. Avoid canned foods and premade or frozen meals. Cooking Avoid adding salt when cooking. Use salt-free seasonings or herbs instead of table salt or sea salt. Check with your health care provider or pharmacist before using salt substitutes. Do not fry foods. Cook foods using healthy methods such as baking, boiling, grilling, and broiling instead. Cook with heart-healthy oils, such as olive, canola, soybean, or sunflower oil. Meal planning Eat a balanced diet that includes: 5 or more servings of fruits and vegetables each day. At each meal, try to fill half of your plate with fruits and vegetables. Up to 6-8 servings of whole grains each day. Less than 6 oz of lean meat, poultry, or fish each day. A 3-oz serving of meat is about the same size as a deck of cards. One egg equals 1 oz. 2 servings of low-fat dairy each day. A serving of nuts, seeds, or beans 5 times each week. Heart-healthy fats. Healthy fats called Omega-3 fatty acids are found in foods such as flaxseeds and  coldwater fish, like sardines, salmon, and mackerel. Limit how much you eat of the following: Canned or prepackaged foods. Food that is high in trans fat, such as fried foods. Food that is high in saturated fat, such as fatty meat. Sweets, desserts, sugary drinks, and other foods with added sugar. Full-fat dairy products. Do not salt foods before eating. Try to eat at least 2 vegetarian meals each week. Eat more home-cooked food and less restaurant, buffet, and fast food. When eating at a  restaurant, ask that your food be prepared with less salt or no salt, if possible. What foods are recommended? The items listed may not be a complete list. Talk with your dietitian about what dietary choices are best for you. Grains Whole-grain or whole-wheat bread. Whole-grain or whole-wheat pasta. Brown rice. Modena Morrow. Bulgur. Whole-grain and low-sodium cereals. Pita bread. Low-fat, low-sodium crackers. Whole-wheat flour tortillas. Vegetables Fresh or frozen vegetables (raw, steamed, roasted, or grilled). Low-sodium or reduced-sodium tomato and vegetable juice. Low-sodium or reduced-sodium tomato sauce and tomato paste. Low-sodium or reduced-sodium canned vegetables. Fruits All fresh, dried, or frozen fruit. Canned fruit in natural juice (without added sugar). Meat and other protein foods Skinless chicken or [place]. Ground chicken or [place]. Pork with fat trimmed off. Fish and seafood. Egg whites. Dried beans, peas, or lentils. Unsalted nuts, nut butters, and seeds. Unsalted canned beans. Lean cuts of beef with fat trimmed off. Low-sodium, lean deli meat. Dairy Low-fat (1%) or fat-free (skim) milk. Fat-free, low-fat, or reduced-fat cheeses. Nonfat, low-sodium ricotta or cottage cheese. Low-fat or nonfat yogurt. Low-fat, low-sodium cheese. Fats and oils Soft margarine without trans fats. Vegetable oil. Low-fat, reduced-fat, or light mayonnaise and salad dressings (reduced-sodium). Canola, safflower, olive, soybean, and sunflower oils. Avocado. Seasoning and other foods Herbs. Spices. Seasoning mixes without salt. Unsalted popcorn and pretzels. Fat-free sweets. What foods are not recommended? The items listed may not be a complete list. Talk with your dietitian about what dietary choices are best for you. Grains Baked goods made with fat, such as croissants, muffins, or some breads. Dry pasta or rice meal packs. Vegetables Creamed or fried vegetables. Vegetables in a cheese sauce.  Regular canned vegetables (not low-sodium or reduced-sodium). Regular canned tomato sauce and paste (not low-sodium or reduced-sodium). Regular tomato and vegetable juice (not low-sodium or reduced-sodium). Angie Fava. Olives. Fruits Canned fruit in a light or heavy syrup. Fried fruit. Fruit in cream or butter sauce. Meat and other protein foods Fatty cuts of meat. Ribs. Fried meat. Berniece Salines. Sausage. Bologna and other processed lunch meats. Salami. Fatback. Hotdogs. Bratwurst. Salted nuts and seeds. Canned beans with added salt. Canned or smoked fish. Whole eggs or egg yolks. Chicken or [place] with skin. Dairy Whole or 2% milk, cream, and half-and-half. Whole or full-fat cream cheese. Whole-fat or sweetened yogurt. Full-fat cheese. Nondairy creamers. Whipped toppings. Processed cheese and cheese spreads. Fats and oils Butter. Stick margarine. Lard. Shortening. Ghee. Bacon fat. Tropical oils, such as coconut, palm kernel, or palm oil. Seasoning and other foods Salted popcorn and pretzels. Onion salt, garlic salt, seasoned salt, table salt, and sea salt. Worcestershire sauce. Tartar sauce. Barbecue sauce. Teriyaki sauce. Soy sauce, including reduced-sodium. Steak sauce. Canned and packaged gravies. Fish sauce. Oyster sauce. Cocktail sauce. Horseradish that you find on the shelf. Ketchup. Mustard. Meat flavorings and tenderizers. Bouillon cubes. Hot sauce and Tabasco sauce. Premade or packaged marinades. Premade or packaged taco seasonings. Relishes. Regular salad dressings. Where to find more information: National Heart, Lung, and Blood Institute: https://wilson-eaton.com/ American  Heart Association: www.heart.org Summary The DASH eating plan is a healthy eating plan that has been shown to reduce high blood pressure (hypertension). It may also reduce your risk for type 2 diabetes, heart disease, and stroke. With the DASH eating plan, you should limit salt (sodium) intake to 2,300 mg a day. If you have hypertension,  you may need to reduce your sodium intake to 1,500 mg a day. When on the DASH eating plan, aim to eat more fresh fruits and vegetables, whole grains, lean proteins, low-fat dairy, and heart-healthy fats. Work with your health care provider or diet and nutrition specialist (dietitian) to adjust your eating plan to your individual calorie needs. This information is not intended to replace advice given to you by your health care provider. Make sure you discuss any questions you have with your health care provider. Document Released: 05/13/2011 Document Revised: 05/06/2017 Document Reviewed: 05/17/2016 Elsevier Patient Education  2020 [employer].

## 2022-02-05 NOTE — Telephone Encounter (Signed)
Allison Morris received call from [facility] that they could not do renal duplex for hypertension at there location  Replaced order to be done at [facility] and she will call patient to schedule

## 2022-02-05 NOTE — Telephone Encounter (Signed)
Samples Entrest 24/26 mg #2 Lot [id number] exp 01/2024 Given to patient at office visit

## 2022-02-09 ENCOUNTER — Telehealth (HOSPITAL_BASED_OUTPATIENT_CLINIC_OR_DEPARTMENT_OTHER): Payer: Self-pay | Admitting: Cardiovascular Disease

## 2022-02-09 NOTE — Telephone Encounter (Signed)
Pt c/o medication issue:  1. Name of Medication: Entresto  2. How are you currently taking this medication (dosage and times per day)? 1/2 tablet 2 times a day  3. Are you having a reaction (difficulty breathing--STAT)?   4. What is your medication issue? Itching around her face  and headaches- she also said it is more expensive since when she first started taking it

## 2022-02-09 NOTE — Telephone Encounter (Signed)
Itching around face, took later day of appointment  Denies swelling  Next am after first dose, felt "funny" so skipped morning dose.  Next am took twice a day and 113/66 am and 129/74 pm feels like she can not function with numbers that low Did not take last night 131/82 this am  Has had headache off and on  Willing to try 1/2 tablet daily   Will forward to Dr Oval Linsey for review

## 2022-02-10 NOTE — Telephone Encounter (Signed)
Hey were you able to get clarification on wether okay to try 1/2 tab once daily vs twice?

## 2022-02-10 NOTE — Telephone Encounter (Signed)
° °  Pt is calling back to f/u °

## 2022-02-11 ENCOUNTER — Telehealth: Payer: Self-pay | Admitting: Cardiovascular Disease

## 2022-02-11 NOTE — Telephone Encounter (Signed)
Pt c/o BP issue: STAT if pt c/o blurred vision, one-sided weakness or slurred speech  1. What are your last 5 BP readings? 169/87 170/87 167/84 152/74  2. Are you having any other symptoms (ex. Dizziness, headache, blurred vision, passed out)? No   3. What is your BP issue? Harriett from [facility] is calling back in regards to this pt who has stopped taking her Entresto due to reaction. She states that the BP has gone too high and would like a call back as soon as possible.  [phone]  She states that if she doesn't get a call back soon that the patient will drive to the office.

## 2022-02-11 NOTE — Telephone Encounter (Signed)
Returned call to patient,   Patient expresses concern that the medicine that she was prescribed makes her blood pressure too low, so low that feels like she cannot function.   Patient states that her blood pressure without the entresto her blood pressures are as follows, 169/87 170/87 167/84 152/74  Patient states the entresto is also making her itch and it is more expensive than before.   Advised patient if she feels like she is having a reaction to stop taking the medication, continue tracking her blood pressures daily and we will call her back with recommendations once we hear from Dr. Oval Linsey.

## 2022-02-14 LAB — ALDOSTERONE + RENIN ACTIVITY W/ RATIO
ALDOS/RENIN RATIO: 10.3 (ref 0.0–30.0)
ALDOSTERONE: 6.1 ng/dL (ref 0.0–30.0)
Renin: 0.59 ng/mL/hr (ref 0.167–5.380)

## 2022-02-14 LAB — CATECHOLAMINES, FRACTIONATED, PLASMA
Dopamine: 66 pg/mL — ABNORMAL HIGH (ref 0–48)
Epinephrine: 32 pg/mL (ref 0–62)
Norepinephrine: 605 pg/mL (ref 0–874)

## 2022-02-14 LAB — METANEPHRINES, PLASMA
Metanephrine, Free: 27.4 pg/mL (ref 0.0–88.0)
Normetanephrine, Free: 100.7 pg/mL (ref 0.0–297.2)

## 2022-02-14 LAB — TSH: TSH: 4.85 u[IU]/mL — ABNORMAL HIGH (ref 0.450–4.500)

## 2022-02-15 DIAGNOSIS — Z5181 Encounter for therapeutic drug level monitoring: Secondary | ICD-10-CM | POA: Diagnosis not present

## 2022-02-15 DIAGNOSIS — I10 Essential (primary) hypertension: Secondary | ICD-10-CM | POA: Diagnosis not present

## 2022-02-15 DIAGNOSIS — N1832 Chronic kidney disease, stage 3b: Secondary | ICD-10-CM | POA: Diagnosis not present

## 2022-02-15 MED ORDER — DOXAZOSIN MESYLATE 2 MG PO TABS: 2.0000 mg | ORAL_TABLET | Freq: Every day | ORAL | 3 refills | Status: DC

## 2022-02-15 NOTE — Telephone Encounter (Signed)
Returned call to patient and provided the following recommendations,   Per Dr. Oval Linsey,   "She can try doxazosin '2mg'$  qhs.     TCR"

## 2022-02-15 NOTE — Telephone Encounter (Signed)
See 9/7 phone note, d/c and starting Doxazosin

## 2022-02-16 ENCOUNTER — Ambulatory Visit (HOSPITAL_COMMUNITY): Payer: Medicare Other

## 2022-02-16 LAB — BASIC METABOLIC PANEL
BUN/Creatinine Ratio: 16 (ref 12–28)
BUN: 20 mg/dL (ref 8–27)
CO2: 21 mmol/L (ref 20–29)
Calcium: 9.9 mg/dL (ref 8.7–10.3)
Chloride: 98 mmol/L (ref 96–106)
Creatinine, Ser: 1.23 mg/dL — ABNORMAL HIGH (ref 0.57–1.00)
Glucose: 90 mg/dL (ref 70–99)
Potassium: 4.5 mmol/L (ref 3.5–5.2)
Sodium: 133 mmol/L — ABNORMAL LOW (ref 134–144)
eGFR: 43 mL/min/{1.73_m2} — ABNORMAL LOW (ref >59)

## 2022-02-17 ENCOUNTER — Telehealth (HOSPITAL_BASED_OUTPATIENT_CLINIC_OR_DEPARTMENT_OTHER): Payer: Self-pay | Admitting: *Deleted

## 2022-02-17 NOTE — Telephone Encounter (Signed)
Spoke with patient and she did not get to start the Doxazosin secondary to being able to pick up  Her blood pressure has been running in the 120's-140's/70's with just the Bystolic so she was afraid to take the Doxazosin  Not taking Entresto either Only taking Bystolic 2.5 mg twice a day and extra tablet if SBP above 150  She will keep log twice a day of readings times 1 week and call back with updated readings and if has had to take any extra Bystolic

## 2022-02-19 DIAGNOSIS — N1832 Chronic kidney disease, stage 3b: Secondary | ICD-10-CM | POA: Diagnosis not present

## 2022-02-19 DIAGNOSIS — I129 Hypertensive chronic kidney disease with stage 1 through stage 4 chronic kidney disease, or unspecified chronic kidney disease: Secondary | ICD-10-CM | POA: Diagnosis not present

## 2022-02-24 ENCOUNTER — Ambulatory Visit (HOSPITAL_COMMUNITY)
Admission: RE | Admit: 2022-02-24 | Discharge: 2022-02-24 | Disposition: A | Discharge status: Home / Self Care | Payer: Medicare Other | Source: Ambulatory Visit | Attending: Otolaryngology | Admitting: Otolaryngology

## 2022-02-24 DIAGNOSIS — R221 Localized swelling, mass and lump, neck: Secondary | ICD-10-CM | POA: Insufficient documentation

## 2022-02-24 DIAGNOSIS — I6529 Occlusion and stenosis of unspecified carotid artery: Secondary | ICD-10-CM | POA: Diagnosis not present

## 2022-02-24 MED ORDER — IOHEXOL 300 MG/ML  SOLN
75.0000 mL | Freq: Once | INTRAMUSCULAR | Status: AC | PRN
  Administered 2022-02-24: 75 mL via INTRAVENOUS

## 2022-03-01 NOTE — Telephone Encounter (Signed)
February 27, 2022 Skeet Latch, MD to Me      02/27/22  1:00 PM OK thank you.

## 2022-03-08 ENCOUNTER — Ambulatory Visit
Payer: Medicare Other | Attending: Cardiovascular Disease | Admitting: Pharmacist Clinician (PhC)/ Clinical Pharmacy Specialist

## 2022-03-08 DIAGNOSIS — I10 Essential (primary) hypertension: Secondary | ICD-10-CM

## 2022-03-08 NOTE — Progress Notes (Signed)
03/08/2022 [name] 1936-04-24 [id number]   HPI:  Allison Morris is a 86 y.o. female patient of Dr Oval Linsey, with a [place] below who presents today for advanced hypertension clinic follow up.  Patient was referred to our clinic by Dr. Domenic Polite because of labile BP readings and multiple medication intolerances. (See list below).  When she saw Dr. Oval Linsey last month her pressure was 217/83.  She was taking only nebivolol 2.5 mg twice daily, with an extra dose for very elevated readings.  She had one episode where her pressure dropped from over 200 to 188 after taking hydralazine.  She felt this was too low.  Dr. Oval Linsey started her on Entresto 24/26, just 1/2 tablet twice daily.  She called 4 days later, noting her pressure was down to 113/66 and she felt like she couldn't function.  She had been taking 1 tablet twice daily rather than 1/2 tab.  She was asked to cut the tablets in half, and told she could take just once daily.  She took for another day or two before reporting itching and cost issues.  Pressure increased to 169-678 systolic range.  She was given prescription for doxazosin, but never picked it up, stating home readings were down to 120-140/70's.   She was told to continue with just nebivolol and keep follow up appointment.    Today she is in the office for follow up.  States she purchased the doxazosin, but has not taken any, as her home readings are fine.  Although she doesn't have any with her, she notes mornings are usually 938-101'B systolic, with evenings getting up as high as 140-150's.  Went to kidney MD last month and says they were quite happy with her BP reading.  She does take the nebivolol twice daily.   Past Medical History: hyperlipidemia 2/23 LDL 102  TIA Several years ago, no residual defects  Aortic aneurysm Aortic root dilation - stable on 7/23 echo  CKD 9/23 SCr 1.23, GFR 43  DVT Left leg 1997     Blood Pressure Goal:  130/80  Current Medications:   nebivolol 2.5 mg bid, can take extra dose for systolic > 510  Social Hx:   no tobacco, no alcohol, no caffeine, mostly ginger ale or sprite   Diet:  some home cooked, some eating out, watches diet 2/2 gout , watches sodium closely    Home BP readings: none with her but states almost all < 160.       Intolerances:  ACE inhibitors ARB Chlorthalidone Spironolactone Amlodipine Felodipine Hydralazine Labetalol Lisinopril Losartan Entresto  Labs: 9/23:  Na 133, K 4.5, Glu 90, BUN 20, SCr 1.23, GFR 43   Wt Readings from Last 3 Encounters:  02/05/22 111 lb 14.4 oz (50.8 kg)  12/24/21 109 lb 9.6 oz (49.7 kg)  08/05/21 110 lb 3.7 oz (50 kg)   BP Readings from Last 3 Encounters:  03/08/22 (!) 212/88  02/05/22 (!) 217/83  12/24/21 (!) 164/82   Pulse Readings from Last 3 Encounters:  03/08/22 (!) 56  02/05/22 (!) 54  12/24/21 64    Current Outpatient Medications  Medication Sig Dispense Refill   acetaminophen (TYLENOL) 500 MG tablet Take 500 mg by mouth every 6 (six) hours as needed for mild pain or moderate pain.      albuterol (VENTOLIN HFA) 108 (90 Base) MCG/ACT inhaler Inhale 1-2 puffs into the lungs every 6 (six) hours as needed for wheezing or shortness of breath. 18 g  0   aspirin EC 81 MG EC tablet Take 1 tablet (81 mg total) by mouth daily. 100 tablet 1   Biotin 10000 MCG TABS Take 10,000 mcg by mouth daily.     Cholecalciferol (VITAMIN D3) 1000 units CAPS Take 1,000 Units by mouth daily.     clobetasol cream (TEMOVATE) 3.14 % Apply 1 application topically daily as needed (for irritation).      clopidogrel (PLAVIX) 75 MG tablet Take 1 tablet (75 mg total) by mouth daily. 30 tablet 2   colchicine 0.6 MG tablet Take 0.5 tablets (0.3 mg total) by mouth daily.     fexofenadine (ALLEGRA) 180 MG tablet Take 180 mg by mouth daily.     Flax Oil-Fish Oil-Borage Oil (FISH-FLAX-BORAGE) CAPS Take 1 capsule by mouth 2 (two) times daily.     fluticasone (FLONASE) 50 MCG/ACT nasal  spray Place 2 sprays into both nostrils daily as needed for allergies.     Glucosamine-Chondroit-Vit C-Mn (GLUCOSAMINE 1500 COMPLEX PO) Take 1 tablet by mouth daily.      guaiFENesin (MUCINEX) 600 MG 12 hr tablet Take 1 tablet (600 mg total) by mouth 2 (two) times daily. 30 tablet 0   nebivolol (BYSTOLIC) 2.5 MG tablet Take 1 tablet (2.5 mg total) by mouth 2 (two) times daily. OK TO TAKE AN EXTRA TABLET AS NEEDED FOR BLOOD PRESSURE ABOVE 150 40 tablet 3   pantoprazole (PROTONIX) 40 MG tablet Take 40 mg by mouth daily.      Polyethyl Glycol-Propyl Glycol (SYSTANE OP) Place 1 drop into both eyes daily as needed (dry eyes).     vitamin C (ASCORBIC ACID) 500 MG tablet Take 500 mg by mouth 2 (two) times daily.     doxazosin (CARDURA) 2 MG tablet Take 1 tablet (2 mg total) by mouth at bedtime. 90 tablet 3   HYDROcodone-acetaminophen (NORCO/VICODIN) 5-325 MG tablet Take 1 tablet by mouth every 6 (six) hours as needed. (Patient not taking: Reported on 03/08/2022) 6 tablet 0   trolamine salicylate (ASPERCREME) 10 % cream Apply 1 application topically as needed for muscle pain. (Patient not taking: Reported on 03/08/2022)     No current facility-administered medications for this visit.    Allergies  Allergen Reactions   Amlodipine     Caused "angina"   Biaxin [Clarithromycin]     headache   Doxycycline     Cramps    Estrogens     cramps   Felodipine     Swelling per patient, legs felt restless   Hydralazine Other (See Comments)    Numb all over the body and severe headache   Labetalol     dizziness   Lisinopril Cough   Losartan     "Angina" symptoms   Macrodantin [Nitrofurantoin]     headaches   Statins     cramps   Ceftin [Cefuroxime] Palpitations and Other (See Comments)    Rapid heart beat redness   Chlorthalidone     Dry Mouth   Ciprofloxacin Rash   Sulfa Antibiotics Itching    REDNESS    Past Medical History:  Diagnosis Date   Aortic dilatation ([place])    a. echo 08/2017  showed EF 65-70%, grade 1 DD, mild AI, mildly dilated ascending aorta (27m), mild MR, mild TR.    Aortic regurgitation    Arthritis    hands   CKD (chronic kidney disease), stage III (HCC)    Essential hypertension    GERD (gastroesophageal reflux disease)    History of  DVT (deep vein thrombosis)    Left leg 1997    Hyperlipemia    Hypertensive urgency 02/05/2022   Mild aortic insufficiency    Mild mitral regurgitation    Mild tricuspid insufficiency    Seasonal allergies     Blood pressure (!) 212/88, pulse (!) 56.  HYPERTENSION CONTROL Vitals:   03/08/22 0941 03/08/22 1134  BP: (!) 209/80 (!) 212/88    The patient's blood pressure is elevated above target today.  In order to address the patient's elevated BP: Blood pressure will be monitored at home to determine if medication changes need to be made.; A referral to the [facility] will be placed.     Hypertension Patient with hypertension, unsure if uncontrolled or white coat component.  States was 233 systolic this am, however 209 in the office and 212 on repeat.  She is not willing to add medications, as she fears hypotensive symptoms.  Asked her to take her home cuff to [facility] office this Friday.  Has appointment for renal dopplers.  Will see if RN can do quick verification of home cuff.  Asked that she return in 6 weeks, with home readings, and home cuff (again).  Continue nebivolol 2.5 mg bid for now.     Tommy Medal PharmD CPP [place] [address] [place], [place] [address] [phone]

## 2022-03-08 NOTE — Assessment & Plan Note (Signed)
Patient with hypertension, unsure if uncontrolled or white coat component.  States was 676 systolic this am, however 209 in the office and 212 on repeat.  She is not willing to add medications, as she fears hypotensive symptoms.  Asked her to take her home cuff to [facility] office this Friday.  Has appointment for renal dopplers.  Will see if RN can do quick verification of home cuff.  Asked that she return in 6 weeks, with home readings, and home cuff (again).  Continue nebivolol 2.5 mg bid for now.

## 2022-03-08 NOTE — Patient Instructions (Signed)
Return for a a follow up appointment Monday November 20 at 9:30 am  Check your blood pressure at home daily and keep record of the readings.  Take your BP meds as follows:  Continue with your current medications.  BRING YOUR BLOOD PRESSURE MACHINE/CUFF TO YOUR NEXT APPOINTMENT, ALONG WITH YOUR LIST OF HOME READINGS.   Exercise as you're able, try to walk approximately 30 minutes per day.  Keep salt intake to a minimum, especially watch canned and prepared boxed foods.  Eat more fresh fruits and vegetables and fewer canned items.  Avoid eating in fast food restaurants.    HOW TO TAKE YOUR BLOOD PRESSURE: Rest 5 minutes before taking your blood pressure.  Don't smoke or drink caffeinated beverages for at least 30 minutes before. Take your blood pressure before (not after) you eat. Sit comfortably with your back supported and both feet on the floor (don't cross your legs). Elevate your arm to heart level on a table or a desk. Use the proper sized cuff. It should fit smoothly and snugly around your bare upper arm. There should be enough room to slip a fingertip under the cuff. The bottom edge of the cuff should be 1 inch above the crease of the elbow. Ideally, take 3 measurements at one sitting and record the average.

## 2022-03-12 ENCOUNTER — Ambulatory Visit (INDEPENDENT_AMBULATORY_CARE_PROVIDER_SITE_OTHER): Payer: Medicare Other

## 2022-03-12 ENCOUNTER — Telehealth (HOSPITAL_BASED_OUTPATIENT_CLINIC_OR_DEPARTMENT_OTHER): Payer: Self-pay | Admitting: *Deleted

## 2022-03-12 DIAGNOSIS — I10 Essential (primary) hypertension: Secondary | ICD-10-CM

## 2022-03-12 NOTE — Telephone Encounter (Signed)
Patient brought her blood pressure monitor into office to see if working correctly  Our blood pressure machine 190/80 and 206/82, her machine 202/84 and 190/90  She is currently taking Bystolic 2.5 mg twice a day and an additional if SBP above 150 She has not had any additional Bystolic since seeing Alena Bills D earlier in week  Will forward to [place] for review

## 2022-03-15 ENCOUNTER — Telehealth (HOSPITAL_BASED_OUTPATIENT_CLINIC_OR_DEPARTMENT_OTHER): Payer: Self-pay | Admitting: *Deleted

## 2022-03-15 DIAGNOSIS — R93429 Abnormal radiologic findings on diagnostic imaging of unspecified kidney: Secondary | ICD-10-CM

## 2022-03-15 DIAGNOSIS — I10 Essential (primary) hypertension: Secondary | ICD-10-CM

## 2022-03-15 NOTE — Telephone Encounter (Signed)
-----   Message from Skeet Latch, MD sent at 03/13/2022  7:58 AM EDT ----- No renal artery stenosis.  There is an abnormality in the R kidney.  We need an abdominal MRI to better evaluate.

## 2022-03-16 NOTE — Telephone Encounter (Signed)
MRI of abdomen ordered

## 2022-03-17 NOTE — Telephone Encounter (Signed)
Called patient to discuss scheduling the MR Abdomen w contrast ordered by Dr. Oval Linsey.  Patient wants to know why she needs to have this study done.  She states she has Stage III kidney disease and her kidney doctor told her one of her kidneys is larger than the other, but they are fine.  Please advise

## 2022-03-17 NOTE — Telephone Encounter (Signed)
Patient already aware of abnormality in kidneys, wants to know if she still needs the scan

## 2022-03-18 NOTE — Telephone Encounter (Signed)
Returned call to patient and provided the following information. Patient states she will follow up with her nephrologist and then let us know how she would like to proceed.       "There is a mass in her kidney that needs to be better evaluated.  If she wants to get her nephrologist's opinion before getting the MRI that is fine.   TCR "

## 2022-03-29 DIAGNOSIS — L218 Other seborrheic dermatitis: Secondary | ICD-10-CM | POA: Diagnosis not present

## 2022-03-29 DIAGNOSIS — L570 Actinic keratosis: Secondary | ICD-10-CM | POA: Diagnosis not present

## 2022-03-29 DIAGNOSIS — L718 Other rosacea: Secondary | ICD-10-CM | POA: Diagnosis not present

## 2022-03-29 DIAGNOSIS — X32XXXD Exposure to sunlight, subsequent encounter: Secondary | ICD-10-CM | POA: Diagnosis not present

## 2022-04-12 ENCOUNTER — Other Ambulatory Visit (HOSPITAL_BASED_OUTPATIENT_CLINIC_OR_DEPARTMENT_OTHER): Payer: Self-pay | Admitting: Cardiovascular Disease

## 2022-04-12 DIAGNOSIS — N1832 Chronic kidney disease, stage 3b: Secondary | ICD-10-CM

## 2022-04-12 DIAGNOSIS — I77819 Aortic ectasia, unspecified site: Secondary | ICD-10-CM

## 2022-04-12 DIAGNOSIS — I160 Hypertensive urgency: Secondary | ICD-10-CM

## 2022-04-12 DIAGNOSIS — R030 Elevated blood-pressure reading, without diagnosis of hypertension: Secondary | ICD-10-CM

## 2022-04-12 DIAGNOSIS — Z5181 Encounter for therapeutic drug level monitoring: Secondary | ICD-10-CM

## 2022-04-12 DIAGNOSIS — I10 Essential (primary) hypertension: Secondary | ICD-10-CM

## 2022-04-14 ENCOUNTER — Ambulatory Visit: Payer: Medicare Other | Attending: Cardiology

## 2022-04-14 DIAGNOSIS — I160 Hypertensive urgency: Secondary | ICD-10-CM | POA: Diagnosis not present

## 2022-04-14 DIAGNOSIS — Z5181 Encounter for therapeutic drug level monitoring: Secondary | ICD-10-CM | POA: Diagnosis not present

## 2022-04-14 DIAGNOSIS — R030 Elevated blood-pressure reading, without diagnosis of hypertension: Secondary | ICD-10-CM | POA: Diagnosis not present

## 2022-04-14 NOTE — Progress Notes (Unsigned)
24 hour ambulatory blood pressure monitor applied to patients left arm using small adult cuff.

## 2022-04-25 NOTE — Progress Notes (Unsigned)
04/26/2022 AVIE CHECO [birth year] [id number]   HPI:  Allison Morris is a 86 y.o. female patient of Dr Oval Linsey, with a Many below who presents today for advanced hypertension clinic follow up.  Patient was referred to our clinic by Dr. Domenic Polite because of labile BP readings and multiple medication intolerances. (See list below).  When she saw Dr. Oval Linsey last month her pressure was 217/83.  She was taking only nebivolol 2.5 mg twice daily, with an extra dose for very elevated readings.  She had one episode where her pressure dropped from over 200 to 188 after taking hydralazine.  She felt this was too low.  Dr. Oval Linsey started her on Entresto 24/26, just 1/2 tablet twice daily.  She called 4 days later, noting her pressure was down to 113/66 and she felt like she couldn't function.  She had been taking 1 tablet twice daily rather than 1/2 tab.  She was asked to cut the tablets in half, and told she could take just once daily.  She took for another day or two before reporting itching and cost issues.  Pressure increased to 449-675 systolic range.  She was given prescription for doxazosin, but never picked it up, stating home readings were down to 120-140/70's.   She was told to continue with just nebivolol and keep follow up appointment.  At her last visit she had an elevated reading in the office, but had been in the 120's at nephrology.    Today she returns for follow up.  She has a list of home pressures for the past 2 months (see below).  States that she takes the third dose of nebivolol several times each week, sometimes in the afternoon and other times in the middle of the night.  She notes that when her pressure is elevated her face feels flush and she can tell that it's high.  Sometimes this wakes her in the middle of the night.   She still has not taken the doxazosin.     Past Medical History: hyperlipidemia 2/23 LDL 102  TIA Several years ago, no residual defects  Aortic aneurysm  Aortic root dilation - stable on 7/23 echo  CKD 9/23 SCr 1.23, GFR 43  DVT Left leg 1997     Blood Pressure Goal:  130/80  Current Medications:  nebivolol 2.5 mg bid, can take extra dose for systolic > 916  Social Hx:   no tobacco, no alcohol, no caffeine, mostly ginger ale or sprite   Diet:  some home cooked, some eating out, watches diet 2/2 gout , watches sodium closely ; eating out is not fast food, but the same few restaurants  Home BP readings: home cuff ([employer]) reads within 10 points in the office today  Last 20 days   AM average 149/80 HR 58  (range 121-174/71-94)   PM average 155/80 HR 64  (range 134-179/66-87)       Intolerances:  ACE inhibitors ARB Chlorthalidone Spironolactone Amlodipine Felodipine Hydralazine Labetalol Lisinopril Losartan Entresto  Labs: 9/23:  Na 133, K 4.5, Glu 90, BUN 20, SCr 1.23, GFR 43   Wt Readings from Last 3 Encounters:  02/05/22 111 lb 14.4 oz (50.8 kg)  12/24/21 109 lb 9.6 oz (49.7 kg)  08/05/21 110 lb 3.7 oz (50 kg)   BP Readings from Last 3 Encounters:  04/26/22 (!) 162/74  03/08/22 (!) 212/88  02/05/22 (!) 217/83   Pulse Readings from Last 3 Encounters:  04/26/22 65  03/08/22 (!)  56  02/05/22 (!) 54    Current Outpatient Medications  Medication Sig Dispense Refill   albuterol (VENTOLIN HFA) 108 (90 Base) MCG/ACT inhaler Inhale 1-2 puffs into the lungs every 6 (six) hours as needed for wheezing or shortness of breath. 18 g 0   aspirin EC 81 MG EC tablet Take 1 tablet (81 mg total) by mouth daily. 100 tablet 1   Biotin 10000 MCG TABS Take 10,000 mcg by mouth daily.     Cholecalciferol (VITAMIN D3) 1000 units CAPS Take 1,000 Units by mouth daily.     clobetasol cream (TEMOVATE) 2.87 % Apply 1 application topically daily as needed (for irritation).      clopidogrel (PLAVIX) 75 MG tablet Take 1 tablet (75 mg total) by mouth daily. 30 tablet 2   colchicine 0.6 MG tablet Take 0.5 tablets (0.3 mg total) by mouth daily.  (Patient taking differently: Take 0.6 mg by mouth daily.)     fexofenadine (ALLEGRA) 180 MG tablet Take 180 mg by mouth daily.     Flax Oil-Fish Oil-Borage Oil (FISH-FLAX-BORAGE) CAPS Take 1 capsule by mouth 2 (two) times daily.     fluticasone (FLONASE) 50 MCG/ACT nasal spray Place 2 sprays into both nostrils daily as needed for allergies.     Glucosamine-Chondroit-Vit C-Mn (GLUCOSAMINE 1500 COMPLEX PO) Take 1 tablet by mouth daily.      guaiFENesin (MUCINEX) 600 MG 12 hr tablet Take 1 tablet (600 mg total) by mouth 2 (two) times daily. 30 tablet 0   nebivolol (BYSTOLIC) 2.5 MG tablet Take 1 tablet (2.5 mg total) by mouth 2 (two) times daily. OK TO TAKE AN EXTRA TABLET AS NEEDED FOR BLOOD PRESSURE ABOVE 150 40 tablet 3   pantoprazole (PROTONIX) 40 MG tablet Take 40 mg by mouth daily.      Polyethyl Glycol-Propyl Glycol (SYSTANE OP) Place 1 drop into both eyes daily as needed (dry eyes).     trolamine salicylate (ASPERCREME) 10 % cream Apply 1 application  topically as needed for muscle pain.     vitamin C (ASCORBIC ACID) 500 MG tablet Take 500 mg by mouth 2 (two) times daily.     acetaminophen (TYLENOL) 500 MG tablet Take 500 mg by mouth every 6 (six) hours as needed for mild pain or moderate pain.      doxazosin (CARDURA) 2 MG tablet Take 1 tablet (2 mg total) by mouth at bedtime. (Patient not taking: Reported on 04/26/2022) 90 tablet 3   HYDROcodone-acetaminophen (NORCO/VICODIN) 5-325 MG tablet Take 1 tablet by mouth every 6 (six) hours as needed. (Patient not taking: Reported on 03/08/2022) 6 tablet 0   No current facility-administered medications for this visit.    Allergies  Allergen Reactions   Amlodipine     Caused "angina"   Biaxin [Clarithromycin]     headache   Doxycycline     Cramps    Estrogens     cramps   Felodipine     Swelling per patient, legs felt restless   Hydralazine Other (See Comments)    Numb all over the body and severe headache   Labetalol     dizziness    Lisinopril Cough   Losartan     "Angina" symptoms   Macrodantin [Nitrofurantoin]     headaches   Statins     cramps   Ceftin [Cefuroxime] Palpitations and Other (See Comments)    Rapid heart beat redness   Chlorthalidone     Dry Mouth   Ciprofloxacin Rash  Sulfa Antibiotics Itching    REDNESS    Past Medical History:  Diagnosis Date   Aortic dilatation ([place])    a. echo 08/2017 showed EF 65-70%, grade 1 DD, mild AI, mildly dilated ascending aorta (31m), mild MR, mild TR.    Aortic regurgitation    Arthritis    hands   CKD (chronic kidney disease), stage III (HCC)    Essential hypertension    GERD (gastroesophageal reflux disease)    History of DVT (deep vein thrombosis)    Left leg 1997    Hyperlipemia    Hypertensive urgency 02/05/2022   Mild aortic insufficiency    Mild mitral regurgitation    Mild tricuspid insufficiency    Seasonal allergies     Blood pressure (!) 162/74, pulse 65.  HYPERTENSION CONTROL Vitals:   04/26/22 0935 04/26/22 0943  BP: (!) 165/73 (!) 162/74    The patient's blood pressure is elevated above target today.  In order to address the patient's elevated BP: A new medication was prescribed today.     Hypertension Patient with essential hypertension and multiple medication intolerances.  Since she seems willing, will have her add in doxazosin 1 mg and stressed that it should be at bedtime to avoid any first dose orthostasis.  She will continue with nebivolol 2.5 mg twice daily and third dose for SBP > 150.  Consider increasing to 5 mg in the morning and 2.5 mg at night, but will need to monitor HR closely as it often runs in the 50's.  She is scheduled to see Dr. MDomenic Politein February, we can see her after that as needed.    KTommy MedalPharmD CPP [id number] Cardinal RoadSGreeleyGPotomac [place] [phone]

## 2022-04-26 ENCOUNTER — Ambulatory Visit
Payer: Medicare Other | Attending: Internal Medicine | Admitting: Pharmacist Clinician (PhC)/ Clinical Pharmacy Specialist

## 2022-04-26 ENCOUNTER — Encounter: Payer: Self-pay | Admitting: Pharmacist Clinician (PhC)/ Clinical Pharmacy Specialist

## 2022-04-26 VITALS — BP 162/74 | HR 65

## 2022-04-26 DIAGNOSIS — I10 Essential (primary) hypertension: Secondary | ICD-10-CM | POA: Diagnosis not present

## 2022-04-26 NOTE — Patient Instructions (Signed)
Return for a a follow up appointment with Dr. Domenic Polite in February  Check your blood pressure at home 3-4 days each week and keep record of the readings.  Take your BP meds as follows:  START DOXAZOSIN 1 MG (1/2 TABLET) EACH NIGHT AT BEDTIME.  CONTINUE WITH NEBIVOLOL (BYSTOLIC) 2.5 MG TWICE DAILY AND EXTRA DOSE IF SYSTOLIC BP IS > 202.  Bring all of your meds, your BP cuff and your record of home blood pressures to your next appointment.  Exercise as you're able, try to walk approximately 30 minutes per day.  Keep salt intake to a minimum, especially watch canned and prepared boxed foods.  Eat more fresh fruits and vegetables and fewer canned items.  Avoid eating in fast food restaurants.    HOW TO TAKE YOUR BLOOD PRESSURE: Rest 5 minutes before taking your blood pressure.  Don't smoke or drink caffeinated beverages for at least 30 minutes before. Take your blood pressure before (not after) you eat. Sit comfortably with your back supported and both feet on the floor (don't cross your legs). Elevate your arm to heart level on a table or a desk. Use the proper sized cuff. It should fit smoothly and snugly around your bare upper arm. There should be enough room to slip a fingertip under the cuff. The bottom edge of the cuff should be 1 inch above the crease of the elbow. Ideally, take 3 measurements at one sitting and record the average.

## 2022-04-26 NOTE — Assessment & Plan Note (Signed)
Patient with essential hypertension and multiple medication intolerances.  Since she seems willing, will have her add in doxazosin 1 mg and stressed that it should be at bedtime to avoid any first dose orthostasis.  She will continue with nebivolol 2.5 mg twice daily and third dose for SBP > 150.  Consider increasing to 5 mg in the morning and 2.5 mg at night, but will need to monitor HR closely as it often runs in the 50's.  She is scheduled to see Dr. Domenic Polite in February, we can see her after that as needed.

## 2022-05-17 ENCOUNTER — Encounter: Payer: Self-pay | Admitting: Cardiology

## 2022-05-17 ENCOUNTER — Telehealth: Payer: Self-pay | Admitting: Cardiovascular Disease

## 2022-05-17 MED ORDER — NEBIVOLOL HCL 2.5 MG PO TABS: 2.5000 mg | ORAL_TABLET | Freq: Two times a day (BID) | ORAL | 3 refills | Status: DC

## 2022-05-17 NOTE — Telephone Encounter (Signed)
Allison Morris KeyMilagros Morris - Rx #: [id number] Need help? Call us at [phone] Outcome Approvedtoday Effective from 05/17/2022 through 05/18/2023.  Advised patient and updated Rx

## 2022-05-17 NOTE — Telephone Encounter (Signed)
Spoke with patient who was started on Bystolic when it first came out. After reviewing chart looks like 2016 Stated after the Bystolic went generic she started having headaches, Dr Domenic Polite sent in letter to get NB covered and is expiring soon  Received prior auth request for NB, has been initiated

## 2022-05-17 NOTE — Telephone Encounter (Signed)
Pt c/o medication issue:  1. Name of Medication: nebivolol (BYSTOLIC) 2.5 MG tablet   2. How are you currently taking this medication (dosage and times per day)? Take 1 tablet (2.5 mg total) by mouth 2 (two) times daily. OK TO TAKE AN EXTRA TABLET AS NEEDED FOR BLOOD PRESSURE ABOVE 150 - Oral   3. Are you having a reaction (difficulty breathing--STAT)?   4. What is your medication issue? Pt calling because she cannot take the generic brand of this medication because they give her bad headaches and she needs a new script. Pt states she has 4 days of pills left.

## 2022-05-17 NOTE — Telephone Encounter (Signed)
Error

## 2022-05-18 ENCOUNTER — Telehealth (HOSPITAL_BASED_OUTPATIENT_CLINIC_OR_DEPARTMENT_OTHER): Payer: Self-pay

## 2022-05-18 NOTE — Telephone Encounter (Signed)
Fax received that bystolic approved, pt. Notified

## 2022-06-13 ENCOUNTER — Ambulatory Visit
Admission: RE | Admit: 2022-06-13 | Discharge: 2022-06-13 | Disposition: A | Discharge status: Home / Self Care | Payer: Medicare Other | Source: Ambulatory Visit | Attending: Nurse Practitioner | Admitting: Nurse Practitioner

## 2022-06-13 VITALS — BP 174/78 | HR 94 | Temp 98.1°F | Resp 20

## 2022-06-13 DIAGNOSIS — Z1152 Encounter for screening for COVID-19: Secondary | ICD-10-CM | POA: Insufficient documentation

## 2022-06-13 DIAGNOSIS — J029 Acute pharyngitis, unspecified: Secondary | ICD-10-CM | POA: Diagnosis not present

## 2022-06-13 LAB — POCT RAPID STREP A (OFFICE): Rapid Strep A Screen: NEGATIVE

## 2022-06-13 MED ORDER — LIDOCAINE VISCOUS HCL 2 % MT SOLN: 5.0000 mL | Freq: Four times a day (QID) | OROMUCOSAL | 0 refills | Status: DC | PRN

## 2022-06-13 NOTE — ED Triage Notes (Signed)
Sore throat since Friday.  Runny nose, productive cough after waking up x a few months.  Has been taking mucinex

## 2022-06-13 NOTE — Discharge Instructions (Addendum)
Rapid strep test is negative, COVID test is pending.  Patient is a candidate to receive molnupiravir if her COVID test is positive. Take medication as prescribed. Increase fluids and allow for plenty of rest. Recommend Tylenol as needed for pain, fever, or general discomfort. Recommend throat lozenges, Chloraseptic or honey to help with throat pain. Warm salt water gargles 3-4 times daily to help with throat pain or discomfort.  You may also continue gargling with Cepacol which you have already been doing. Recommend a diet with soft foods to include soups, broths, puddings, yogurt, Jell-O's, or popsicles until symptoms improve. As discussed, if symptoms do not improve, please follow-up with your primary care physician or in this clinic for further evaluation. Follow-up as needed.

## 2022-06-13 NOTE — ED Provider Notes (Signed)
[facility]    CSN: [id number] Arrival date & time: 06/13/22  1355      History   Chief Complaint Chief Complaint  Patient presents with   Sore Throat    Entered by patient    HPI ALOHA Morris is a 87 y.o. female.   The history is provided by the patient.   She presents with a 2-day history of sore throat.  Patient also reports that she has had a runny nose and cough over the last several months.  She states her cough has actually been improving.  Patient states she has not had fever, chills, headache, chest pain, abdominal pain, nausea, vomiting, or diarrhea.  She does not recall any known sick contacts.  She reports she has been gargling with Cepacol mouthwash for her throat pain which has helped her symptoms.  She also has been taking Mucinex for her cough.  Past Medical History:  Diagnosis Date   Aortic dilatation ([place])    a. echo 08/2017 showed EF 65-70%, grade 1 DD, mild AI, mildly dilated ascending aorta (22m), mild MR, mild TR.    Aortic regurgitation    Arthritis    hands   CKD (chronic kidney disease), stage III (HCC)    Essential hypertension    GERD (gastroesophageal reflux disease)    History of DVT (deep vein thrombosis)    Left leg 1997    Hyperlipemia    Hypertensive urgency 02/05/2022   Mild aortic insufficiency    Mild mitral regurgitation    Mild tricuspid insufficiency    Seasonal allergies     Patient Active Problem List   Diagnosis Date Noted   Hypertensive urgency 02/05/2022   Pneumonia of both lungs due to infectious organism 07/20/2021   Acute respiratory failure with hypoxia ([facility] 07/20/2021   Stage 3b chronic kidney disease (CKD) ([facility] 07/20/2021   Mass of finger of right hand 05/30/2019   Osteoarthritis of both hands 05/30/2019   Orthostatic lightheadedness 12/11/2017   TIA (transient ischemic attack) 11/14/2015   Hypertension    Hyperlipemia    History of DVT (deep vein thrombosis)    Aortic dilatation ([facility]     Past  Surgical History:  Procedure Laterality Date   APPENDECTOMY     CARPAL TUNNEL RELEASE     IR RADIOLOGIST EVAL & MGMT  01/21/2021   IR SACROPLASTY BILATERAL  12/18/2020   MASS EXCISION Right 08/07/2019   Procedure: EXCISION CYST RIGHT INDEX AND RIGHT MIDDLE FINGERS WITH DEBRIDEMENT OF METACARPALPHALANGEAL JOINTS;  Surgeon: KDaryll Brod MD;  [place]  Service: Orthopedics;  Laterality: Right;   TONSILLECTOMY      OB History   No obstetric history on file.      Home Medications    Prior to Admission medications   Medication Sig Start Date End Date Taking? Authorizing Provider  lidocaine (XYLOCAINE) 2 % solution Use as directed 5 mLs in the mouth or throat every 6 (six) hours as needed for mouth pain. Gargle and spit 548mevery 6 hours as needed for severe throat pain. 06/13/22  Yes Allison Morris, ChAlda LeaNP  acetaminophen (TYLENOL) 500 MG tablet Take 500 mg by mouth every 6 (six) hours as needed for mild pain or moderate pain.     [provider]  albuterol (VENTOLIN HFA) 108 (90 Base) MCG/ACT inhaler Inhale 1-2 puffs into the lungs every 6 (six) hours as needed for wheezing or shortness of breath. 11/30/21   LaVolney AmericanPA-C  aspirin EC 81 MG EC tablet Take 1 tablet (81 mg total) by mouth daily. 11/15/15   Orvan Falconer, MD  Biotin 10000 MCG TABS Take 10,000 mcg by mouth daily.    [provider]  Cholecalciferol (VITAMIN D3) 1000 units CAPS Take 1,000 Units by mouth daily.    [provider]  clobetasol cream (TEMOVATE) 5.72 % Apply 1 application topically daily as needed (for irritation).     [provider]  clopidogrel (PLAVIX) 75 MG tablet Take 1 tablet (75 mg total) by mouth daily. 11/15/15   Orvan Falconer, MD  colchicine 0.6 MG tablet Take 0.5 tablets (0.3 mg total) by mouth daily. Patient taking differently: Take 0.6 mg by mouth daily. 07/22/21   Barton Dubois, MD  doxazosin (CARDURA) 2 MG tablet Take 1 tablet (2 mg  total) by mouth at bedtime. Patient not taking: Reported on 04/26/2022 02/15/22   Skeet Latch, MD  fexofenadine (ALLEGRA) 180 MG tablet Take 180 mg by mouth daily.    [provider]  Flax Oil-Fish Oil-Borage Oil (FISH-FLAX-BORAGE) CAPS Take 1 capsule by mouth 2 (two) times daily.    [provider]  fluticasone (FLONASE) 50 MCG/ACT nasal spray Place 2 sprays into both nostrils daily as needed for allergies.    [provider]  Glucosamine-Chondroit-Vit C-Mn (GLUCOSAMINE 1500 COMPLEX PO) Take 1 tablet by mouth daily.     [provider]  guaiFENesin (MUCINEX) 600 MG 12 hr tablet Take 1 tablet (600 mg total) by mouth 2 (two) times daily. 11/30/21   Volney American, PA-C  HYDROcodone-acetaminophen (NORCO/VICODIN) 5-325 MG tablet Take 1 tablet by mouth every 6 (six) hours as needed. Patient not taking: Reported on 03/08/2022 06/03/21   Noemi Chapel, MD  nebivolol (BYSTOLIC) 2.5 MG tablet Take 1 tablet (2.5 mg total) by mouth 2 (two) times daily. OK TO TAKE AN EXTRA TABLET AS NEEDED FOR BLOOD PRESSURE ABOVE 150 05/17/22   Skeet Latch, MD  pantoprazole (PROTONIX) 40 MG tablet Take 40 mg by mouth daily.     [provider]  Polyethyl Glycol-Propyl Glycol (SYSTANE OP) Place 1 drop into both eyes daily as needed (dry eyes).    [provider]  trolamine salicylate (ASPERCREME) 10 % cream Apply 1 application  topically as needed for muscle pain.    [provider]  vitamin C (ASCORBIC ACID) 500 MG tablet Take 500 mg by mouth 2 (two) times daily.    [provider]    Family History Family History  Problem Relation Age of Onset   Aneurysm Sister    Valvular heart disease Sister    Aneurysm Paternal Grandfather    Heart attack Paternal Grandfather     Social History Social History   Tobacco Use   Smoking status: Never   Smokeless tobacco: Never  Vaping Use   Vaping Use: Never used  Substance Use Topics    Alcohol use: No    Alcohol/week: 0.0 standard drinks of alcohol   Drug use: No     Allergies   Amlodipine, Biaxin [clarithromycin], Doxycycline, Estrogens, Felodipine, Hydralazine, Labetalol, Lisinopril, Losartan, Macrodantin [nitrofurantoin], Statins, Ceftin [cefuroxime], Chlorthalidone, Ciprofloxacin, and Sulfa antibiotics   Review of Systems Review of Systems Per HPI  Physical Exam Triage Vital Signs ED Triage Vitals  Enc Vitals Group     BP 06/13/22 1411 (!) 174/78     Pulse Rate 06/13/22 1411 94     Resp 06/13/22 1411 20     Temp 06/13/22 1411 98.1 F (  36.7 C)     Temp Source 06/13/22 1411 Oral     SpO2 06/13/22 1411 98 %     Weight --      Height --      Head Circumference --      Peak Flow --      Pain Score 06/13/22 1413 8     Pain Loc --      Pain Edu? --      Excl. in [place]? --    No data found.  Updated Vital Signs BP (!) 174/78 (BP Location: Right Arm)   Pulse 94   Temp 98.1 F (36.7 C) (Oral)   Resp 20   SpO2 98%   Visual Acuity Right Eye Distance:   Left Eye Distance:   Bilateral Distance:    Right Eye Near:   Left Eye Near:    Bilateral Near:     Physical Exam Vitals and nursing note reviewed.  Constitutional:      General: She is not in acute distress.    Appearance: She is well-developed.  HENT:     Head: Normocephalic.     Right Ear: Tympanic membrane and ear canal normal.     Left Ear: Tympanic membrane and ear canal normal.     Nose: Congestion present. No rhinorrhea.     Mouth/Throat:     Mouth: Mucous membranes are moist.     Pharynx: Uvula midline. Pharyngeal swelling and posterior oropharyngeal erythema present.     Tonsils: No tonsillar exudate. 1+ on the right. 1+ on the left.     Comments: Cobblestoning present on oropharynx Eyes:     Conjunctiva/sclera: Conjunctivae normal.     Pupils: Pupils are equal, round, and reactive to light.  Cardiovascular:     Rate and Rhythm: Normal rate and regular rhythm.     Heart sounds:  Normal heart sounds.  Pulmonary:     Effort: Pulmonary effort is normal. No respiratory distress.     Breath sounds: Normal breath sounds. No stridor. No wheezing, rhonchi or rales.  Abdominal:     General: Bowel sounds are normal.     Palpations: Abdomen is soft.     Tenderness: There is no abdominal tenderness.  Musculoskeletal:     Cervical back: Normal range of motion.  Lymphadenopathy:     Cervical: No cervical adenopathy.  Skin:    General: Skin is warm and dry.  Neurological:     General: No focal deficit present.     Mental Status: She is alert and oriented to person, place, and time.  Psychiatric:        Mood and Affect: Mood normal.        Behavior: Behavior normal.      UC Treatments / Results  Labs (all labs ordered are listed, but only abnormal results are displayed) Labs Reviewed  SARS CORONAVIRUS 2 (TAT 6-24 HRS)  CULTURE, GROUP A STREP [facility])  POCT RAPID STREP A (OFFICE)    EKG   Radiology No results found.  Procedures Procedures (including critical care time)  Medications Ordered in UC Medications - No data to display  Initial Impression / Assessment and Plan / UC Course  I have reviewed the triage vital signs and the nursing notes.  Pertinent labs & imaging results that were available during my care of the patient were reviewed by me and considered in my medical decision making (see chart for details).  The patient is well-appearing, she is in no acute distress,  she is hypertensive, but vital signs are otherwise stable.  Rapid strep test is negative.  Throat culture and COVID test are pending.  Patient is a candidate to receive molnupiravir if her COVID test is positive.  Suspect viral pharyngitis at this time.  Will treat patient symptomatically with viscous lidocaine 2% for severe throat pain.  Supportive care recommendations were provided to the patient, along with providing information regarding return precautions..  Patient verbalizes  understanding, all questions were answered.  Patient stable for discharge.   Final Clinical Impressions(s) / UC Diagnoses   Final diagnoses:  Encounter for screening for COVID-19  Sore throat     Discharge Instructions      Rapid strep test is negative, COVID test is pending.  Patient is a candidate to receive molnupiravir if her COVID test is positive. Take medication as prescribed. Increase fluids and allow for plenty of rest. Recommend Tylenol as needed for pain, fever, or general discomfort. Recommend throat lozenges, Chloraseptic or honey to help with throat pain. Warm salt water gargles 3-4 times daily to help with throat pain or discomfort.  You may also continue gargling with Cepacol which you have already been doing. Recommend a diet with soft foods to include soups, broths, puddings, yogurt, Jell-O's, or popsicles until symptoms improve. As discussed, if symptoms do not improve, please follow-up with your primary care physician or in this clinic for further evaluation. Follow-up as needed.     ED Prescriptions     Medication Sig Dispense Auth. Provider   lidocaine (XYLOCAINE) 2 % solution Use as directed 5 mLs in the mouth or throat every 6 (six) hours as needed for mouth pain. Gargle and spit 21m every 6 hours as needed for severe throat pain. 100 mL Towana Stenglein-Warren, CAlda Lea NP      PDMP not reviewed this encounter.   LTish Men NP 06/13/22 1438

## 2022-06-15 LAB — SARS CORONAVIRUS 2 (TAT 6-24 HRS): SARS Coronavirus 2: NEGATIVE

## 2022-06-16 LAB — CULTURE, GROUP A STREP (THRC)

## 2022-06-17 ENCOUNTER — Telehealth (HOSPITAL_COMMUNITY): Payer: Self-pay | Admitting: Emergency Medicine

## 2022-06-17 MED ORDER — AMOXICILLIN 500 MG PO CAPS: 500.0000 mg | ORAL_CAPSULE | Freq: Two times a day (BID) | ORAL | 0 refills | Status: AC

## 2022-06-17 MED ORDER — FLUCONAZOLE 150 MG PO TABS: 150.0000 mg | ORAL_TABLET | Freq: Once | ORAL | 0 refills | Status: AC

## 2022-07-12 DIAGNOSIS — Z681 Body mass index (BMI) 19 or less, adult: Secondary | ICD-10-CM | POA: Diagnosis not present

## 2022-07-12 DIAGNOSIS — I10 Essential (primary) hypertension: Secondary | ICD-10-CM | POA: Diagnosis not present

## 2022-07-12 DIAGNOSIS — Z23 Encounter for immunization: Secondary | ICD-10-CM | POA: Diagnosis not present

## 2022-07-12 DIAGNOSIS — I77819 Aortic ectasia, unspecified site: Secondary | ICD-10-CM | POA: Diagnosis not present

## 2022-07-12 DIAGNOSIS — Z0001 Encounter for general adult medical examination with abnormal findings: Secondary | ICD-10-CM | POA: Diagnosis not present

## 2022-07-12 DIAGNOSIS — E039 Hypothyroidism, unspecified: Secondary | ICD-10-CM | POA: Diagnosis not present

## 2022-07-12 DIAGNOSIS — I129 Hypertensive chronic kidney disease with stage 1 through stage 4 chronic kidney disease, or unspecified chronic kidney disease: Secondary | ICD-10-CM | POA: Diagnosis not present

## 2022-07-12 DIAGNOSIS — N183 Chronic kidney disease, stage 3 unspecified: Secondary | ICD-10-CM | POA: Diagnosis not present

## 2022-07-12 DIAGNOSIS — E559 Vitamin D deficiency, unspecified: Secondary | ICD-10-CM | POA: Diagnosis not present

## 2022-07-12 DIAGNOSIS — D518 Other vitamin B12 deficiency anemias: Secondary | ICD-10-CM | POA: Diagnosis not present

## 2022-07-12 DIAGNOSIS — Z1331 Encounter for screening for depression: Secondary | ICD-10-CM | POA: Diagnosis not present

## 2022-07-14 ENCOUNTER — Encounter: Payer: Self-pay | Admitting: Cardiology

## 2022-07-14 ENCOUNTER — Ambulatory Visit: Payer: Medicare Other | Attending: Cardiology | Admitting: Cardiology

## 2022-07-14 VITALS — BP 168/74 | HR 54 | Ht 66.0 in | Wt 113.0 lb

## 2022-07-14 DIAGNOSIS — I351 Nonrheumatic aortic (valve) insufficiency: Secondary | ICD-10-CM | POA: Diagnosis not present

## 2022-07-14 DIAGNOSIS — I77819 Aortic ectasia, unspecified site: Secondary | ICD-10-CM

## 2022-07-14 DIAGNOSIS — I10 Essential (primary) hypertension: Secondary | ICD-10-CM

## 2022-07-14 NOTE — Patient Instructions (Addendum)
Medication Instructions:  Your physician recommends that you continue on your current medications as directed. Please refer to the Current Medication list given to you today. Doxazosin removed from medication list  Labwork: none  Testing/Procedures: none  Follow-Up: Your physician recommends that you schedule a follow-up appointment in: 6 months  Any Other Special Instructions Will Be Listed Below (If Applicable). You do not need to follow up in the hypertension clinic  If you need a refill on your cardiac medications before your next appointment, please call your pharmacy.

## 2022-07-14 NOTE — Progress Notes (Signed)
Cardiology Office Note  Date: 07/14/2022   ID: Allison Morris, DOB 21-Jan-1936, MRN [id number]  PCP:  Redmond School, MD  Cardiologist:  Rozann Lesches, MD Electrophysiologist:  None   Chief Complaint  Patient presents with   Cardiac follow-up    History of Present Illness: Allison Morris is an 87 y.o. female last seen in November 2023 in the hypertension clinic, I reviewed the note.  She is here for a follow-up visit.  We went over her home blood pressure records.  Overall, morning blood pressures tend to be reasonable range, typically higher in the evening when she checks it although this is before she takes her evening dose of Bystolic.  She was prescribed doxazosin to take in the evenings after her last visit in the hypertension clinic, however she was very worried about doing this due to the potential for orthostasis and never started it.  She tells me that she is not comfortable taking that medication because she lives alone.  She has to take an extra dose of Bystolic on average once a week, otherwise on stable 2.5 mg twice daily dose.  She does not report any chest pain, has had no syncope.  I reviewed her recent lab work, she continues to follow with Dr. Gerarda Fraction.  Past Medical History:  Diagnosis Date   Aortic dilatation ([place])    a. echo 08/2017 showed EF 65-70%, grade 1 DD, mild AI, mildly dilated ascending aorta (100m), mild MR, mild TR.    Aortic regurgitation    Arthritis    CKD (chronic kidney disease), stage III (HCC)    Essential hypertension    GERD (gastroesophageal reflux disease)    History of DVT (deep vein thrombosis)    Left leg 1997    Hyperlipemia    Hypertensive urgency 02/05/2022   Mild aortic insufficiency    Mild mitral regurgitation    Mild tricuspid insufficiency    Seasonal allergies     Current Outpatient Medications  Medication Sig Dispense Refill   acetaminophen (TYLENOL) 500 MG tablet Take 500 mg by mouth every 6 (six) hours as needed for  mild pain or moderate pain.      albuterol (VENTOLIN HFA) 108 (90 Base) MCG/ACT inhaler Inhale 1-2 puffs into the lungs every 6 (six) hours as needed for wheezing or shortness of breath. 18 g 0   aspirin EC 81 MG EC tablet Take 1 tablet (81 mg total) by mouth daily. 100 tablet 1   Biotin 10000 MCG TABS Take 10,000 mcg by mouth daily.     Cholecalciferol (VITAMIN D3) 1000 units CAPS Take 1,000 Units by mouth daily.     clobetasol cream (TEMOVATE) 04.09% Apply 1 application topically daily as needed (for irritation).      clopidogrel (PLAVIX) 75 MG tablet Take 1 tablet (75 mg total) by mouth daily. 30 tablet 2   colchicine 0.6 MG tablet Take 0.6 mg by mouth daily.     fexofenadine (ALLEGRA) 180 MG tablet Take 180 mg by mouth daily.     Flax Oil-Fish Oil-Borage Oil (FISH-FLAX-BORAGE) CAPS Take 1 capsule by mouth 2 (two) times daily.     fluticasone (FLONASE) 50 MCG/ACT nasal spray Place 2 sprays into both nostrils daily as needed for allergies.     Glucosamine-Chondroit-Vit C-Mn (GLUCOSAMINE 1500 COMPLEX PO) Take 1 tablet by mouth daily.      guaiFENesin (MUCINEX) 600 MG 12 hr tablet Take 1 tablet (600 mg total) by mouth 2 (two) times daily.  30 tablet 0   HYDROcodone-acetaminophen (NORCO/VICODIN) 5-325 MG tablet Take 1 tablet by mouth every 6 (six) hours as needed. 6 tablet 0   lidocaine (XYLOCAINE) 2 % solution Use as directed 5 mLs in the mouth or throat every 6 (six) hours as needed for mouth pain. Gargle and spit 28m every 6 hours as needed for severe throat pain. 100 mL 0   nebivolol (BYSTOLIC) 2.5 MG tablet Take 1 tablet (2.5 mg total) by mouth 2 (two) times daily. OK TO TAKE AN EXTRA TABLET AS NEEDED FOR BLOOD PRESSURE ABOVE 150 (Patient taking differently: Take 2.5 mg by mouth 2 (two) times daily. OK TO TAKE AN EXTRA TABLET AS NEEDED FOR SYSTOLIC BLOOD PRESSURE ABOVE 150) 180 tablet 3   pantoprazole (PROTONIX) 40 MG tablet Take 40 mg by mouth daily.      Polyethyl Glycol-Propyl Glycol (SYSTANE  OP) Place 1 drop into both eyes daily as needed (dry eyes).     trolamine salicylate (ASPERCREME) 10 % cream Apply 1 application  topically as needed for muscle pain.     vitamin C (ASCORBIC ACID) 500 MG tablet Take 500 mg by mouth 2 (two) times daily.     No current facility-administered medications for this visit.   Allergies:  Amlodipine, Biaxin [clarithromycin], Doxycycline, Estrogens, Felodipine, Hydralazine, Labetalol, Lisinopril, Losartan, Macrodantin [nitrofurantoin], Statins, Ceftin [cefuroxime], Chlorthalidone, Ciprofloxacin, and Sulfa antibiotics   ROS: No palpitations.  No orthopnea or PND.  Physical Exam: VS:  BP (!) 168/74   Pulse (!) 54   Ht '5\' 6"'$  (1.676 m)   Wt 113 lb (51.3 kg)   BMI 18.24 kg/m , BMI Body mass index is 18.24 kg/m.  Wt Readings from Last 3 Encounters:  07/14/22 113 lb (51.3 kg)  02/05/22 111 lb 14.4 oz (50.8 kg)  12/24/21 109 lb 9.6 oz (49.7 kg)    General: Patient appears comfortable at rest. HEENT: Conjunctiva and lids normal. Neck: Supple, no elevated JVP or carotid bruits. Lungs: Clear to auscultation, nonlabored breathing at rest. Cardiac: Regular rate and rhythm, no S3, 1/6 systolic murmur. Extremities: No pitting edema.  ECG:  An ECG dated 08/05/2021 was personally reviewed today and demonstrated:  Sinus rhythm.  Recent Labwork: 07/20/2021: ALT 18; AST 19; B Natriuretic Peptide 354.0 08/05/2021: Hemoglobin 13.0; Platelets 308 02/05/2022: TSH 4.850 02/15/2022: BUN 20; Creatinine, Ser 1.23; Potassium 4.5; Sodium 133  February 2024: BUN 25, creatinine 1.81, potassium 5.3, AST 29, ALT 18, cholesterol 215, triglycerides 160, HDL 57, LDL 130, hemoglobin 13.8, platelets 178, TSH 5.  7 1  Other Studies Reviewed Today:  Chest CTA 09/22/2021: IMPRESSION: 1. There is emphysema with scattered areas of peripheral bronchiolectasis and mucous plugging consistent with small airways disease and/or chronic aspiration. 2. Small band consolidation anteriorly  in the right upper lobe, peripheral consolidation in the right middle lobe laterally, and several areas with ground-glass disease bilaterally. Findings most likely reflect multifocal pneumonitis. Consider three-month follow-up repeat chest CT to ensure clearing. 3. No nodules or dense areas of consolidation.  No pleural effusion. 4. Cardiomegaly.  No venous distention.  No edema. 5. 4.5 cm aneurysmal ascending aorta. Annual CTA or MRA follow-up recommended. Reference: J Am Coll Radiol 25701;77:939-030 6. 1.5 cm left thyroid heterogeneous nodule. Follow-up ultrasound recommended. Reference: J Am Coll Radiol. 2015 Feb;12(2): 143-50. 7. Coronary artery and aortic atherosclerosis. Pulmonary trunk ectasia consistent with arterial hypertension. 8. Shotty up to borderline size mediastinal nodes. No bulky or encasing adenopathy. 9. Remaining findings described above.  Echocardiogram 12/17/2021:  1. Left ventricular ejection fraction, by estimation, is 60 to 65%. The  left ventricle has normal function. The left ventricle has no regional  wall motion abnormalities. Left ventricular diastolic parameters are  consistent with Grade I diastolic  dysfunction (impaired relaxation). Elevated left atrial pressure. The  average left ventricular global longitudinal strain is -17.8 %. The global  longitudinal strain is normal.   2. Right ventricular systolic function is normal. The right ventricular  size is normal. Tricuspid regurgitation signal is inadequate for assessing  PA pressure.   3. The mitral valve is normal in structure. No evidence of mitral valve  regurgitation. No evidence of mitral stenosis.   4. The aortic valve is tricuspid. There is mild calcification of the  aortic valve. There is mild thickening of the aortic valve. Aortic valve  regurgitation is mild. No aortic stenosis is present.   5. Aortic dilatation noted. There is moderate dilatation of the ascending  aorta, measuring 43 mm.    6. The inferior vena cava is normal in size with greater than 50%  respiratory variability, suggesting right atrial pressure of 3 mmHg.   Assessment and Plan:  1.  Essential hypertension, difficult to control particularly in the setting of multiple medication intolerances.  I reviewed workup through the hypertension clinic with medication recommendations.  She does not want to take doxazosin as discussed above.  Plan is to continue Bystolic 2.5 mg twice daily, she can take an additional dose if systolic blood pressure remains over 150 consistently after standing doses.  2.  No evidence of renal artery stenosis by renal artery Dopplers in October 2023 arranged through the hypertension clinic.  She did have a hypoechoic mass in the upper pole of the right kidney, cystic versus solid not able to be determined by that modality.  Additional imaging was recommended, patient elected to hold off and review further with her nephrologist.  So far she has not undergone any follow-up imaging studies.  3.  Asymptomatic ascending aortic dilatation, 43 mm by chest CTA in April of last year.  She has mild aortic regurgitation in association with this that is also asymptomatic.  Medication Adjustments/Labs and Tests Ordered: Current medicines are reviewed at length with the patient today.  Concerns regarding medicines are outlined above.   Tests Ordered: No orders of the defined types were placed in this encounter.   Medication Changes: No orders of the defined types were placed in this encounter.   Disposition:  Follow up  6 months.  Signed, Satira Sark, MD, [facility] 07/14/2022 10:47 AM    [facility] [place], [place], [place] [address] Phone: [phone]; Fax: [phone]

## 2022-07-15 DIAGNOSIS — I10 Essential (primary) hypertension: Secondary | ICD-10-CM | POA: Diagnosis not present

## 2022-07-28 DIAGNOSIS — N1832 Chronic kidney disease, stage 3b: Secondary | ICD-10-CM | POA: Diagnosis not present

## 2022-08-02 DIAGNOSIS — K080 Exfoliation of teeth due to systemic causes: Secondary | ICD-10-CM | POA: Diagnosis not present

## 2022-08-04 DIAGNOSIS — D631 Anemia in chronic kidney disease: Secondary | ICD-10-CM | POA: Diagnosis not present

## 2022-08-04 DIAGNOSIS — I129 Hypertensive chronic kidney disease with stage 1 through stage 4 chronic kidney disease, or unspecified chronic kidney disease: Secondary | ICD-10-CM | POA: Diagnosis not present

## 2022-08-04 DIAGNOSIS — N1832 Chronic kidney disease, stage 3b: Secondary | ICD-10-CM | POA: Diagnosis not present

## 2022-08-04 DIAGNOSIS — N2581 Secondary hyperparathyroidism of renal origin: Secondary | ICD-10-CM | POA: Diagnosis not present

## 2022-08-09 ENCOUNTER — Other Ambulatory Visit (HOSPITAL_COMMUNITY): Payer: Self-pay | Admitting: Nephrology

## 2022-08-09 DIAGNOSIS — N2889 Other specified disorders of kidney and ureter: Secondary | ICD-10-CM

## 2022-08-24 DIAGNOSIS — E063 Autoimmune thyroiditis: Secondary | ICD-10-CM | POA: Diagnosis not present

## 2022-08-24 DIAGNOSIS — Z6820 Body mass index (BMI) 20.0-20.9, adult: Secondary | ICD-10-CM | POA: Diagnosis not present

## 2022-08-24 DIAGNOSIS — N1832 Chronic kidney disease, stage 3b: Secondary | ICD-10-CM | POA: Diagnosis not present

## 2022-08-24 DIAGNOSIS — L03019 Cellulitis of unspecified finger: Secondary | ICD-10-CM | POA: Diagnosis not present

## 2022-08-24 DIAGNOSIS — E039 Hypothyroidism, unspecified: Secondary | ICD-10-CM | POA: Diagnosis not present

## 2022-08-24 DIAGNOSIS — M1991 Primary osteoarthritis, unspecified site: Secondary | ICD-10-CM | POA: Diagnosis not present

## 2022-08-24 DIAGNOSIS — I129 Hypertensive chronic kidney disease with stage 1 through stage 4 chronic kidney disease, or unspecified chronic kidney disease: Secondary | ICD-10-CM | POA: Diagnosis not present

## 2022-08-24 DIAGNOSIS — I10 Essential (primary) hypertension: Secondary | ICD-10-CM | POA: Diagnosis not present

## 2022-09-03 ENCOUNTER — Ambulatory Visit (HOSPITAL_COMMUNITY)
Admission: RE | Admit: 2022-09-03 | Discharge: 2022-09-03 | Disposition: A | Discharge status: Home / Self Care | Payer: Medicare Other | Source: Ambulatory Visit | Attending: Nephrology | Admitting: Nephrology

## 2022-09-03 DIAGNOSIS — N2889 Other specified disorders of kidney and ureter: Secondary | ICD-10-CM | POA: Diagnosis not present

## 2022-09-03 DIAGNOSIS — N281 Cyst of kidney, acquired: Secondary | ICD-10-CM | POA: Diagnosis not present

## 2022-09-03 MED ORDER — GADOBUTROL 1 MMOL/ML IV SOLN
5.5000 mL | Freq: Once | INTRAVENOUS | Status: AC | PRN
  Administered 2022-09-03: 5.5 mL via INTRAVENOUS

## 2022-09-13 DIAGNOSIS — I10 Essential (primary) hypertension: Secondary | ICD-10-CM | POA: Diagnosis not present

## 2022-09-27 DIAGNOSIS — L570 Actinic keratosis: Secondary | ICD-10-CM | POA: Diagnosis not present

## 2022-09-27 DIAGNOSIS — X32XXXD Exposure to sunlight, subsequent encounter: Secondary | ICD-10-CM | POA: Diagnosis not present

## 2022-09-27 DIAGNOSIS — B078 Other viral warts: Secondary | ICD-10-CM | POA: Diagnosis not present

## 2022-09-27 DIAGNOSIS — L03012 Cellulitis of left finger: Secondary | ICD-10-CM | POA: Diagnosis not present

## 2022-09-27 DIAGNOSIS — L820 Inflamed seborrheic keratosis: Secondary | ICD-10-CM | POA: Diagnosis not present

## 2022-09-27 DIAGNOSIS — L308 Other specified dermatitis: Secondary | ICD-10-CM | POA: Diagnosis not present

## 2022-10-06 DIAGNOSIS — M25551 Pain in right hip: Secondary | ICD-10-CM | POA: Diagnosis not present

## 2022-10-06 DIAGNOSIS — M4316 Spondylolisthesis, lumbar region: Secondary | ICD-10-CM | POA: Diagnosis not present

## 2022-10-06 DIAGNOSIS — M4180 Other forms of scoliosis, site unspecified: Secondary | ICD-10-CM | POA: Diagnosis not present

## 2022-10-06 DIAGNOSIS — M7918 Myalgia, other site: Secondary | ICD-10-CM | POA: Diagnosis not present

## 2022-11-02 ENCOUNTER — Ambulatory Visit
Admission: RE | Admit: 2022-11-02 | Discharge: 2022-11-02 | Disposition: A | Discharge status: Home / Self Care | Payer: Medicare Other | Source: Ambulatory Visit | Attending: Nurse Practitioner | Admitting: Nurse Practitioner

## 2022-11-02 VITALS — BP 233/89 | HR 53 | Temp 97.8°F | Resp 22

## 2022-11-02 DIAGNOSIS — I160 Hypertensive urgency: Secondary | ICD-10-CM | POA: Diagnosis not present

## 2022-11-02 DIAGNOSIS — W57XXXA Bitten or stung by nonvenomous insect and other nonvenomous arthropods, initial encounter: Secondary | ICD-10-CM | POA: Insufficient documentation

## 2022-11-02 DIAGNOSIS — R300 Dysuria: Secondary | ICD-10-CM | POA: Insufficient documentation

## 2022-11-02 DIAGNOSIS — S80861A Insect bite (nonvenomous), right lower leg, initial encounter: Secondary | ICD-10-CM | POA: Diagnosis not present

## 2022-11-02 LAB — POCT URINALYSIS DIP (MANUAL ENTRY)
Bilirubin, UA: NEGATIVE
Blood, UA: NEGATIVE
Glucose, UA: NEGATIVE mg/dL
Ketones, POC UA: NEGATIVE mg/dL
Nitrite, UA: NEGATIVE
Protein Ur, POC: NEGATIVE mg/dL
Spec Grav, UA: 1.015 (ref 1.010–1.025)
Urobilinogen, UA: 0.2 E.U./dL
pH, UA: 5.5 (ref 5.0–8.0)

## 2022-11-02 MED ORDER — ONDANSETRON 4 MG PO TBDP
4.0000 mg | ORAL_TABLET | Freq: Once | ORAL | Status: AC
  Administered 2022-11-02: 4 mg via ORAL

## 2022-11-02 NOTE — ED Triage Notes (Signed)
Reports she got bit by a tick x 4 days. She removed it 3 days ago.pt has a red mark on the back of her right calf.   Pt reports her stomach feels "wheezy."

## 2022-11-02 NOTE — Discharge Instructions (Addendum)
I am concerned about your blood pressure today.  We are check blood work today and will call you with abnormal results.  Please continue to monitor your blood pressure at home.  If it remains elevated and you develop chest pain, headache, or blurred vision, please go to the emergency room to be evaluated.  I do not think the tick bite is causing your symptoms today.  It has not been long enough since you had the tick bite.  The urine sample today shows a little bit of white blood cells.  We are sending for urine culture and will call you if this shows we need to treat with antibiotics later this week.

## 2022-11-02 NOTE — ED Provider Notes (Signed)
[facility]    CSN: [id number] Arrival date & time: 11/02/22  1201      History   Chief Complaint Chief Complaint  Patient presents with   Insect Bite    Entered by patient    HPI Allison Morris is a 87 y.o. female.   Patient presents today for tick bite to right calf that she noticed 4 days ago.  Reports she removed to tick the same day.  Reports today, she has developed "queasy" stomach.  She denies fever, vomiting, diarrhea, change in appetite.  No recent cough, congestion, fever, headache, dizziness/lightheadedness, new muscle pain or joint aches.  No shortness of breath, chest pain, lower extremity swelling.  She initially denied urinary symptoms including dysuria, urinary frequency or urgency, voiding small amounts, foul urinary odor, and change in color of the urine.  Later reports she did have 1 episode of dysuria this morning when she woke up.  Also   reports her blood pressure has been elevated for the past few days at home and she has readings with her today.  Reports the only thing she can take for her blood pressure is Bystolic, everything else makes her feel terrible.  Reports that she has taken her Bystolic today and is allowed to take an extra dose if her blood pressure remains greater than 150 on top.  She has not taken the extra dose yet today.  Reports she follows closely with cardiology and they are aware of her blood pressure.    Past Medical History:  Diagnosis Date   Aortic dilatation (HCC)    a. echo 08/2017 showed EF 65-70%, grade 1 DD, mild AI, mildly dilated ascending aorta (43mm), mild MR, mild TR.    Aortic regurgitation    Arthritis    CKD (chronic kidney disease), stage III (HCC)    Essential hypertension    GERD (gastroesophageal reflux disease)    History of DVT (deep vein thrombosis)    Left leg 1997    Hyperlipemia    Hypertensive urgency 02/05/2022   Mild aortic insufficiency    Mild mitral regurgitation    Mild tricuspid  insufficiency    Seasonal allergies     Patient Active Problem List   Diagnosis Date Noted   Hypertensive urgency 02/05/2022   Pneumonia of both lungs due to infectious organism 07/20/2021   Acute respiratory failure with hypoxia (HCC) 07/20/2021   Stage 3b chronic kidney disease (CKD) (HCC) 07/20/2021   Mass of finger of right hand 05/30/2019   Osteoarthritis of both hands 05/30/2019   Orthostatic lightheadedness 12/11/2017   TIA (transient ischemic attack) 11/14/2015   Hypertension    Hyperlipemia    History of DVT (deep vein thrombosis)    Aortic dilatation [facility])     Past Surgical History:  Procedure Laterality Date   APPENDECTOMY     CARPAL TUNNEL RELEASE     IR RADIOLOGIST EVAL & MGMT  01/21/2021   IR SACROPLASTY BILATERAL  12/18/2020   MASS EXCISION Right 08/07/2019   Procedure: EXCISION CYST RIGHT INDEX AND RIGHT MIDDLE FINGERS WITH DEBRIDEMENT OF METACARPALPHALANGEAL JOINTS;  Surgeon: Cindee Salt, MD;  Location: [facility];  Service: Orthopedics;  Laterality: Right;   TONSILLECTOMY      OB History   No obstetric history on file.      Home Medications    Prior to Admission medications   Medication Sig Start Date End Date Taking? Authorizing Provider  acetaminophen (TYLENOL) 500 MG tablet Take 500  mg by mouth every 6 (six) hours as needed for mild pain or moderate pain.     [provider]  albuterol (VENTOLIN HFA) 108 (90 Base) MCG/ACT inhaler Inhale 1-2 puffs into the lungs every 6 (six) hours as needed for wheezing or shortness of breath. 11/30/21   Particia Nearing, PA-C  aspirin EC 81 MG EC tablet Take 1 tablet (81 mg total) by mouth daily. 11/15/15   Houston Siren, MD  Biotin 16109 MCG TABS Take 10,000 mcg by mouth daily.    [provider]  Cholecalciferol (VITAMIN D3) 1000 units CAPS Take 1,000 Units by mouth daily.    [provider]  clobetasol cream (TEMOVATE) 0.05 % Apply 1 application topically daily as needed (for  irritation).     [provider]  clopidogrel (PLAVIX) 75 MG tablet Take 1 tablet (75 mg total) by mouth daily. 11/15/15   Houston Siren, MD  colchicine 0.6 MG tablet Take 0.6 mg by mouth daily.    [provider]  fexofenadine (ALLEGRA) 180 MG tablet Take 180 mg by mouth daily.    [provider]  Flax Oil-Fish Oil-Borage Oil (FISH-FLAX-BORAGE) CAPS Take 1 capsule by mouth 2 (two) times daily.    [provider]  fluticasone (FLONASE) 50 MCG/ACT nasal spray Place 2 sprays into both nostrils daily as needed for allergies.    [provider]  Glucosamine-Chondroit-Vit C-Mn (GLUCOSAMINE 1500 COMPLEX PO) Take 1 tablet by mouth daily.     [provider]  guaiFENesin (MUCINEX) 600 MG 12 hr tablet Take 1 tablet (600 mg total) by mouth 2 (two) times daily. 11/30/21   Particia Nearing, PA-C  HYDROcodone-acetaminophen (NORCO/VICODIN) 5-325 MG tablet Take 1 tablet by mouth every 6 (six) hours as needed. 06/03/21   Eber Hong, MD  lidocaine (XYLOCAINE) 2 % solution Use as directed 5 mLs in the mouth or throat every 6 (six) hours as needed for mouth pain. Gargle and spit 5mL every 6 hours as needed for severe throat pain. 06/13/22   Leath-Warren, Sadie Haber, NP  nebivolol (BYSTOLIC) 2.5 MG tablet Take 1 tablet (2.5 mg total) by mouth 2 (two) times daily. OK TO TAKE AN EXTRA TABLET AS NEEDED FOR BLOOD PRESSURE ABOVE 150 Patient taking differently: Take 2.5 mg by mouth 2 (two) times daily. OK TO TAKE AN EXTRA TABLET AS NEEDED FOR SYSTOLIC BLOOD PRESSURE ABOVE 604 05/17/22   Chilton Si, MD  pantoprazole (PROTONIX) 40 MG tablet Take 40 mg by mouth daily.     [provider]  Polyethyl Glycol-Propyl Glycol (SYSTANE OP) Place 1 drop into both eyes daily as needed (dry eyes).    [provider]  trolamine salicylate (ASPERCREME) 10 % cream Apply 1 application  topically as needed for muscle pain.    [provider]  vitamin C  (ASCORBIC ACID) 500 MG tablet Take 500 mg by mouth 2 (two) times daily.    [provider]    Family History Family History  Problem Relation Age of Onset   Aneurysm Sister    Valvular heart disease Sister    Aneurysm Paternal Grandfather    Heart attack Paternal Grandfather     Social History Social History   Tobacco Use   Smoking status: Never   Smokeless tobacco: Never  Vaping Use   Vaping Use: Never used  Substance Use Topics   Alcohol use: No    Alcohol/week: 0.0 standard drinks of alcohol   Drug use: No  Allergies   Amlodipine, Biaxin [clarithromycin], Doxycycline, Estrogens, Felodipine, Hydralazine, Labetalol, Lisinopril, Losartan, Macrodantin [nitrofurantoin], Statins, Ceftin [cefuroxime], Chlorthalidone, Ciprofloxacin, and Sulfa antibiotics   Review of Systems Review of Systems Per HPI  Physical Exam Triage Vital Signs ED Triage Vitals  Enc Vitals Group     BP 11/02/22 1222 (!) 233/89     Pulse Rate 11/02/22 1222 (!) 53     Resp 11/02/22 1222 (!) 22     Temp 11/02/22 1222 97.8 F (36.6 C)     Temp Source 11/02/22 1222 Oral     SpO2 11/02/22 1222 98 %     Weight --      Height --      Head Circumference --      Peak Flow --      Pain Score 11/02/22 1225 0     Pain Loc --      Pain Edu? --      Excl. in GC? --    No data found.  Updated Vital Signs BP (!) 233/89 (BP Location: Right Arm) Comment: checked bp twice  Pulse (!) 53   Temp 97.8 F (36.6 C) (Oral)   Resp (!) 22   SpO2 98%   Visual Acuity Right Eye Distance:   Left Eye Distance:   Bilateral Distance:    Right Eye Near:   Left Eye Near:    Bilateral Near:     Physical Exam Vitals and nursing note reviewed.  Constitutional:      General: She is not in acute distress.    Appearance: Normal appearance. She is not toxic-appearing.  HENT:     Head: Normocephalic and atraumatic.     Right Ear: External ear normal.     Left Ear: External ear normal.      Mouth/Throat:     Mouth: Mucous membranes are moist.     Pharynx: Oropharynx is clear. No oropharyngeal exudate.  Eyes:     General: No scleral icterus.       Right eye: No discharge.        Left eye: No discharge.     Extraocular Movements: Extraocular movements intact.     Pupils: Pupils are equal, round, and reactive to light.  Cardiovascular:     Rate and Rhythm: Normal rate and regular rhythm.  Pulmonary:     Effort: Pulmonary effort is normal. No respiratory distress.     Breath sounds: Normal breath sounds. No stridor. No wheezing, rhonchi or rales.  Musculoskeletal:     Cervical back: Normal range of motion.  Lymphadenopathy:     Cervical: No cervical adenopathy.  Skin:    General: Skin is warm and dry.     Capillary Refill: Capillary refill takes less than 2 seconds.     Coloration: Skin is not jaundiced or pale.     Findings: Erythema (erythematous papules to posterior right knee; no warmth, edema, fluctuance, active drainage) present. No rash.  Neurological:     Mental Status: She is alert and oriented to person, place, and time.     Motor: No weakness.     Gait: Gait normal.  Psychiatric:        Behavior: Behavior is cooperative.      UC Treatments / Results  Labs (all labs ordered are listed, but only abnormal results are displayed) Labs Reviewed  POCT URINALYSIS DIP (MANUAL ENTRY) - Abnormal; Notable for the following components:      Result Value   Leukocytes, UA Small (1+) (*)  All other components within normal limits  URINE CULTURE  COMPREHENSIVE METABOLIC PANEL  CBC WITH DIFFERENTIAL/PLATELET    EKG   Radiology No results found.  Procedures Procedures (including critical care time)  Medications Ordered in UC Medications  ondansetron (ZOFRAN-ODT) disintegrating tablet 4 mg (4 mg Oral Given 11/02/22 1251)    Initial Impression / Assessment and Plan / UC Course  I have reviewed the triage vital signs and the nursing notes.  Pertinent  labs & imaging results that were available during my care of the patient were reviewed by me and considered in my medical decision making (see chart for details).   Patient is well-appearing, afebrile, not tachycardic, not tachypneic, oxygenating well on room air.  Patient is hypertensive today in urgent care.  1. Hypertensive urgency Given elevated blood pressure greater than 200 systolic, I recommended further evaluation and management in emergency room Patient adamantly declines CBC, electrolytes, kidney function, liver function obtained to evaluate for further metabolic abnormality Patient assures me she will keep checking at home and follow-up with cardiology or PCP if blood pressure remains elevated She understands the risks of being discharged home with an elevated blood pressure to include heart attack, stroke, possible death and further denies going to the emergency room adamantly  2. Tick bite of right lower leg, initial encounter Examination today is reassuring; no erythema migrans I discussed with patient that I think symptoms are unlikely secondary to the tick bite since it just occurred 3 to 4 days ago Supportive care discussed for tick bite  3. Dysuria Urinalysis today shows small amount of leukocyte esterase Zofran was given in urgent care today which did seem to help with some of the nausea Urine culture is pending Treat as indicated if urine culture is positive for UTI  The patient was given the opportunity to ask questions.  All questions answered to their satisfaction.  The patient is in agreement to this plan.    Final Clinical Impressions(s) / UC Diagnoses   Final diagnoses:  Hypertensive urgency  Tick bite of right lower leg, initial encounter  Dysuria     Discharge Instructions      I am concerned about your blood pressure today.  We are check blood work today and will call you with abnormal results.  Please continue to monitor your blood pressure at home.   If it remains elevated and you develop chest pain, headache, or blurred vision, please go to the emergency room to be evaluated.  I do not think the tick bite is causing your symptoms today.  It has not been long enough since you had the tick bite.  The urine sample today shows a little bit of white blood cells.  We are sending for urine culture and will call you if this shows we need to treat with antibiotics later this week.    ED Prescriptions   None    PDMP not reviewed this encounter.   Valentino Nose, NP 11/02/22 [phone]

## 2022-11-03 LAB — COMPREHENSIVE METABOLIC PANEL
ALT: 18 IU/L (ref 0–32)
AST: 25 IU/L (ref 0–40)
Albumin/Globulin Ratio: 2.1 (ref 1.2–2.2)
Albumin: 4.7 g/dL (ref 3.7–4.7)
Alkaline Phosphatase: 89 IU/L (ref 44–121)
BUN/Creatinine Ratio: 12 (ref 12–28)
BUN: 18 mg/dL (ref 8–27)
Bilirubin Total: 0.7 mg/dL (ref 0.0–1.2)
CO2: 16 mmol/L — ABNORMAL LOW (ref 20–29)
Calcium: 9.9 mg/dL (ref 8.7–10.3)
Chloride: 100 mmol/L (ref 96–106)
Creatinine, Ser: 1.5 mg/dL — ABNORMAL HIGH (ref 0.57–1.00)
Globulin, Total: 2.2 g/dL (ref 1.5–4.5)
Glucose: 95 mg/dL (ref 70–99)
Potassium: 4.7 mmol/L (ref 3.5–5.2)
Sodium: 134 mmol/L (ref 134–144)
Total Protein: 6.9 g/dL (ref 6.0–8.5)
eGFR: 34 mL/min/{1.73_m2} — ABNORMAL LOW (ref >59)

## 2022-11-03 LAB — URINE CULTURE

## 2022-11-03 LAB — CBC WITH DIFFERENTIAL/PLATELET
Basophils Absolute: 0 10*3/uL (ref 0.0–0.2)
Basos: 1 %
EOS (ABSOLUTE): 0.1 10*3/uL (ref 0.0–0.4)
Eos: 2 %
Hematocrit: 42.7 % (ref 34.0–46.6)
Hemoglobin: 15.2 g/dL (ref 11.1–15.9)
Immature Grans (Abs): 0 10*3/uL (ref 0.0–0.1)
Immature Granulocytes: 0 %
Lymphocytes Absolute: 1.1 10*3/uL (ref 0.7–3.1)
Lymphs: 16 %
MCH: 34.8 pg — ABNORMAL HIGH (ref 26.6–33.0)
MCHC: 35.6 g/dL (ref 31.5–35.7)
MCV: 98 fL — ABNORMAL HIGH (ref 79–97)
Monocytes Absolute: 0.7 10*3/uL (ref 0.1–0.9)
Monocytes: 10 %
Neutrophils Absolute: 4.7 10*3/uL (ref 1.4–7.0)
Neutrophils: 71 %
Platelets: 161 10*3/uL (ref 150–450)
RBC: 4.37 x10E6/uL (ref 3.77–5.28)
RDW: 11.8 % (ref 11.7–15.4)
WBC: 6.6 10*3/uL (ref 3.4–10.8)

## 2022-11-04 ENCOUNTER — Telehealth: Payer: Self-pay

## 2022-11-04 NOTE — Telephone Encounter (Signed)
Spoke with patient regarding her lab work.  Patient states BP has come down now.  States she continues to be nauseated and instructed to follow up with Dr. Sherwood Gambler.

## 2022-11-08 DIAGNOSIS — M5416 Radiculopathy, lumbar region: Secondary | ICD-10-CM | POA: Diagnosis not present

## 2022-11-08 DIAGNOSIS — M48062 Spinal stenosis, lumbar region with neurogenic claudication: Secondary | ICD-10-CM | POA: Diagnosis not present

## 2022-11-11 DIAGNOSIS — I129 Hypertensive chronic kidney disease with stage 1 through stage 4 chronic kidney disease, or unspecified chronic kidney disease: Secondary | ICD-10-CM | POA: Diagnosis not present

## 2022-11-11 DIAGNOSIS — R509 Fever, unspecified: Secondary | ICD-10-CM | POA: Diagnosis not present

## 2022-11-11 DIAGNOSIS — N1832 Chronic kidney disease, stage 3b: Secondary | ICD-10-CM | POA: Diagnosis not present

## 2022-11-11 DIAGNOSIS — A770 Spotted fever due to Rickettsia rickettsii: Secondary | ICD-10-CM | POA: Diagnosis not present

## 2022-11-11 DIAGNOSIS — Z6820 Body mass index (BMI) 20.0-20.9, adult: Secondary | ICD-10-CM | POA: Diagnosis not present

## 2022-11-11 DIAGNOSIS — I10 Essential (primary) hypertension: Secondary | ICD-10-CM | POA: Diagnosis not present

## 2022-11-11 DIAGNOSIS — M1991 Primary osteoarthritis, unspecified site: Secondary | ICD-10-CM | POA: Diagnosis not present

## 2022-11-11 DIAGNOSIS — W57XXXA Bitten or stung by nonvenomous insect and other nonvenomous arthropods, initial encounter: Secondary | ICD-10-CM | POA: Diagnosis not present

## 2022-11-16 DIAGNOSIS — I10 Essential (primary) hypertension: Secondary | ICD-10-CM | POA: Diagnosis not present

## 2022-11-16 DIAGNOSIS — W57XXXA Bitten or stung by nonvenomous insect and other nonvenomous arthropods, initial encounter: Secondary | ICD-10-CM | POA: Diagnosis not present

## 2022-11-16 DIAGNOSIS — A770 Spotted fever due to Rickettsia rickettsii: Secondary | ICD-10-CM | POA: Diagnosis not present

## 2022-11-16 DIAGNOSIS — N1832 Chronic kidney disease, stage 3b: Secondary | ICD-10-CM | POA: Diagnosis not present

## 2022-11-16 DIAGNOSIS — T50905A Adverse effect of unspecified drugs, medicaments and biological substances, initial encounter: Secondary | ICD-10-CM | POA: Diagnosis not present

## 2022-11-16 DIAGNOSIS — Z681 Body mass index (BMI) 19 or less, adult: Secondary | ICD-10-CM | POA: Diagnosis not present

## 2022-11-16 DIAGNOSIS — R509 Fever, unspecified: Secondary | ICD-10-CM | POA: Diagnosis not present

## 2022-11-16 DIAGNOSIS — M1991 Primary osteoarthritis, unspecified site: Secondary | ICD-10-CM | POA: Diagnosis not present

## 2022-11-16 DIAGNOSIS — I129 Hypertensive chronic kidney disease with stage 1 through stage 4 chronic kidney disease, or unspecified chronic kidney disease: Secondary | ICD-10-CM | POA: Diagnosis not present

## 2022-11-22 DIAGNOSIS — N1832 Chronic kidney disease, stage 3b: Secondary | ICD-10-CM | POA: Diagnosis not present

## 2022-11-24 DIAGNOSIS — M5459 Other low back pain: Secondary | ICD-10-CM | POA: Diagnosis not present

## 2022-11-30 DIAGNOSIS — M48062 Spinal stenosis, lumbar region with neurogenic claudication: Secondary | ICD-10-CM | POA: Diagnosis not present

## 2022-11-30 DIAGNOSIS — M5416 Radiculopathy, lumbar region: Secondary | ICD-10-CM | POA: Diagnosis not present

## 2022-11-30 DIAGNOSIS — M4316 Spondylolisthesis, lumbar region: Secondary | ICD-10-CM | POA: Diagnosis not present

## 2022-12-01 DIAGNOSIS — N2581 Secondary hyperparathyroidism of renal origin: Secondary | ICD-10-CM | POA: Diagnosis not present

## 2022-12-01 DIAGNOSIS — D631 Anemia in chronic kidney disease: Secondary | ICD-10-CM | POA: Diagnosis not present

## 2022-12-01 DIAGNOSIS — N1832 Chronic kidney disease, stage 3b: Secondary | ICD-10-CM | POA: Diagnosis not present

## 2022-12-01 DIAGNOSIS — I129 Hypertensive chronic kidney disease with stage 1 through stage 4 chronic kidney disease, or unspecified chronic kidney disease: Secondary | ICD-10-CM | POA: Diagnosis not present

## 2022-12-02 DIAGNOSIS — K080 Exfoliation of teeth due to systemic causes: Secondary | ICD-10-CM | POA: Diagnosis not present

## 2022-12-08 DIAGNOSIS — K080 Exfoliation of teeth due to systemic causes: Secondary | ICD-10-CM | POA: Diagnosis not present

## 2023-01-03 DIAGNOSIS — M48062 Spinal stenosis, lumbar region with neurogenic claudication: Secondary | ICD-10-CM | POA: Diagnosis not present

## 2023-01-03 DIAGNOSIS — M5416 Radiculopathy, lumbar region: Secondary | ICD-10-CM | POA: Diagnosis not present

## 2023-01-05 DIAGNOSIS — K080 Exfoliation of teeth due to systemic causes: Secondary | ICD-10-CM | POA: Diagnosis not present

## 2023-01-18 ENCOUNTER — Ambulatory Visit: Payer: Medicare Other | Attending: Cardiology | Admitting: Cardiology

## 2023-01-18 ENCOUNTER — Encounter: Payer: Self-pay | Admitting: Cardiology

## 2023-01-18 VITALS — BP 130/64 | HR 53 | Ht 66.0 in | Wt 118.0 lb

## 2023-01-18 DIAGNOSIS — I10 Essential (primary) hypertension: Secondary | ICD-10-CM

## 2023-01-18 DIAGNOSIS — I351 Nonrheumatic aortic (valve) insufficiency: Secondary | ICD-10-CM

## 2023-01-18 DIAGNOSIS — I77819 Aortic ectasia, unspecified site: Secondary | ICD-10-CM | POA: Diagnosis not present

## 2023-01-18 MED ORDER — NEBIVOLOL HCL 2.5 MG PO TABS: 2.5000 mg | ORAL_TABLET | Freq: Two times a day (BID) | ORAL | 3 refills | Status: DC

## 2023-01-18 NOTE — Patient Instructions (Addendum)

## 2023-01-18 NOTE — Progress Notes (Signed)
Cardiology Office Note  Date: 01/18/2023   ID: [name], DOB 07-08-1935, MRN [id number]  History of Present Illness: Allison Morris is an 87 y.o. female last seen in February.  She is here for a routine visit.  Today's blood pressure looks reasonable, she continues on Bystolic as before and monitors blood pressure at home.  She does not report any exertional chest pain or increasing breathlessness.  Functional with ADLs and did some yard work over the weekend.  ECG today shows sinus bradycardia.  She continues to follow with Dr. Sherwood Gambler, I reviewed her lab work from June.  Physical Exam: VS:  BP 130/64   Pulse (!) 53   Ht 5\' 6"  (1.676 m)   Wt 118 lb (53.5 kg)   SpO2 98%   BMI 19.05 kg/m , BMI Body mass index is 19.05 kg/m.  Wt Readings from Last 3 Encounters:  01/18/23 118 lb (53.5 kg)  07/14/22 113 lb (51.3 kg)  02/05/22 111 lb 14.4 oz (50.8 kg)    General: Patient appears comfortable at rest. HEENT: Conjunctiva and lids normal. Neck: Supple, no elevated JVP or carotid bruits. Lungs: Clear to auscultation, nonlabored breathing at rest. Cardiac: Regular rate and rhythm, no S3, 1/6 systolic murmur. Extremities: No pitting edema.  ECG:  An ECG dated 08/05/2021 was personally reviewed today and demonstrated:  Sinus rhythm.  Labwork: 02/05/2022: TSH 4.850 11/02/2022: ALT 18; AST 25; BUN 18; Creatinine, Ser 1.50; Hemoglobin 15.2; Platelets 161; Potassium 4.7; Sodium 134  June 2024: Hemoglobin 13.4, platelets 162, BUN 24, creatinine 1.46, potassium 5, magnesium 2.2  Other Studies Reviewed Today:  Echocardiogram 12/17/2021:  1. Left ventricular ejection fraction, by estimation, is 60 to 65%. The  left ventricle has normal function. The left ventricle has no regional  wall motion abnormalities. Left ventricular diastolic parameters are  consistent with Grade I diastolic  dysfunction (impaired relaxation). Elevated left atrial pressure. The  average left ventricular  global longitudinal strain is -17.8 %. The global  longitudinal strain is normal.   2. Right ventricular systolic function is normal. The right ventricular  size is normal. Tricuspid regurgitation signal is inadequate for assessing  PA pressure.   3. The mitral valve is normal in structure. No evidence of mitral valve  regurgitation. No evidence of mitral stenosis.   4. The aortic valve is tricuspid. There is mild calcification of the  aortic valve. There is mild thickening of the aortic valve. Aortic valve  regurgitation is mild. No aortic stenosis is present.   5. Aortic dilatation noted. There is moderate dilatation of the ascending  aorta, measuring 43 mm.   6. The inferior vena cava is normal in size with greater than 50%  respiratory variability, suggesting right atrial pressure of 3 mmHg.   Assessment and Plan:  1.  Essential hypertension.  Difficult to control, but no secondary causes based on prior workup in the hypertension clinic (no clear evidence of pheochromocytoma, hyperaldosteronism, or renal artery stenosis)..  She continues on Bystolic, no changes made to current regimen.  Continue to track blood pressure at home.  2.  CKD stage IIIb.  Creatinine 1.46 in June.  Continues to follow with nephrology and has been stable.  3.  Ascending aortic dilatation and aortic regurgitation.  Echocardiogram in July of last year revealed ascending aorta 43 mm with mild aortic regurgitation.  LVEF 60 to 65%.  She is asymptomatic.  Disposition:  Follow up  6 months.  Signed, Jonelle Sidle, M.D.,  F.A.C.C. [facility] at [facility]

## 2023-02-09 DIAGNOSIS — K080 Exfoliation of teeth due to systemic causes: Secondary | ICD-10-CM | POA: Diagnosis not present

## 2023-02-28 DIAGNOSIS — N1832 Chronic kidney disease, stage 3b: Secondary | ICD-10-CM | POA: Diagnosis not present

## 2023-03-10 DIAGNOSIS — I129 Hypertensive chronic kidney disease with stage 1 through stage 4 chronic kidney disease, or unspecified chronic kidney disease: Secondary | ICD-10-CM | POA: Diagnosis not present

## 2023-03-10 DIAGNOSIS — N1832 Chronic kidney disease, stage 3b: Secondary | ICD-10-CM | POA: Diagnosis not present

## 2023-03-10 DIAGNOSIS — N2581 Secondary hyperparathyroidism of renal origin: Secondary | ICD-10-CM | POA: Diagnosis not present

## 2023-03-10 DIAGNOSIS — D631 Anemia in chronic kidney disease: Secondary | ICD-10-CM | POA: Diagnosis not present

## 2023-03-25 DIAGNOSIS — Z6820 Body mass index (BMI) 20.0-20.9, adult: Secondary | ICD-10-CM | POA: Diagnosis not present

## 2023-03-25 DIAGNOSIS — J302 Other seasonal allergic rhinitis: Secondary | ICD-10-CM | POA: Diagnosis not present

## 2023-03-25 DIAGNOSIS — M159 Polyosteoarthritis, unspecified: Secondary | ICD-10-CM | POA: Diagnosis not present

## 2023-05-12 DIAGNOSIS — H02824 Cysts of left upper eyelid: Secondary | ICD-10-CM | POA: Diagnosis not present

## 2023-05-12 DIAGNOSIS — H04123 Dry eye syndrome of bilateral lacrimal glands: Secondary | ICD-10-CM | POA: Diagnosis not present

## 2023-05-12 DIAGNOSIS — H524 Presbyopia: Secondary | ICD-10-CM | POA: Diagnosis not present

## 2023-05-12 DIAGNOSIS — H18513 Endothelial corneal dystrophy, bilateral: Secondary | ICD-10-CM | POA: Diagnosis not present

## 2023-05-12 DIAGNOSIS — H0102A Squamous blepharitis right eye, upper and lower eyelids: Secondary | ICD-10-CM | POA: Diagnosis not present

## 2023-05-16 DIAGNOSIS — M1712 Unilateral primary osteoarthritis, left knee: Secondary | ICD-10-CM | POA: Diagnosis not present

## 2023-05-20 ENCOUNTER — Other Ambulatory Visit (HOSPITAL_COMMUNITY): Payer: Self-pay

## 2023-05-20 ENCOUNTER — Telehealth: Payer: Self-pay | Admitting: Cardiology

## 2023-05-20 ENCOUNTER — Telehealth: Payer: Self-pay | Admitting: Pharmacy Technician

## 2023-05-20 NOTE — Telephone Encounter (Signed)
Pt c/o medication issue:  1. Name of Medication: nebivolol (BYSTOLIC) 2.5 MG tablet   2. How are you currently taking this medication (dosage and times per day)? h  3. Are you having a reaction (difficulty breathing--STAT)? no  4. What is your medication issue? Patient needs a prior auth for the Bystolic. Please advise

## 2023-05-20 NOTE — Telephone Encounter (Signed)
Pharmacy Patient Advocate Encounter   Received notification from Pt Calls Messages that prior authorization for bystolic is required/requested.   Insurance verification completed.   The patient is insured through [facility] .   Per test claim: PA required; PA submitted to above mentioned insurance via CoverMyMeds Key/confirmation #/EOC [id number] Status is pending

## 2023-05-23 ENCOUNTER — Other Ambulatory Visit (HOSPITAL_COMMUNITY): Payer: Self-pay

## 2023-05-23 NOTE — Telephone Encounter (Signed)
Pharmacy Patient Advocate Encounter  Received notification from [facility] that Prior Authorization for bystoloic has been APPROVED from 05/21/23 to 05/19/24. Ran test claim, Copay is $99.00 one month . This test claim was processed through [facility]- copay amounts may vary at other pharmacies due to pharmacy/plan contracts, or as the patient moves through the different stages of their insurance plan.   PA #/Case ID/Reference #: [id number]

## 2023-06-09 DIAGNOSIS — K080 Exfoliation of teeth due to systemic causes: Secondary | ICD-10-CM | POA: Diagnosis not present

## 2023-06-20 ENCOUNTER — Ambulatory Visit
Admission: RE | Admit: 2023-06-20 | Discharge: 2023-06-20 | Disposition: A | Discharge status: Home / Self Care | Payer: Medicare Other | Source: Ambulatory Visit | Attending: Nurse Practitioner | Admitting: Nurse Practitioner

## 2023-06-20 VITALS — BP 150/80 | HR 73 | Temp 97.7°F | Resp 17

## 2023-06-20 DIAGNOSIS — J069 Acute upper respiratory infection, unspecified: Secondary | ICD-10-CM

## 2023-06-20 LAB — POC COVID19/FLU A&B COMBO
Covid Antigen, POC: NEGATIVE
Influenza A Antigen, POC: NEGATIVE
Influenza B Antigen, POC: NEGATIVE

## 2023-06-20 MED ORDER — BENZONATATE 100 MG PO CAPS: 100.0000 mg | ORAL_CAPSULE | Freq: Three times a day (TID) | ORAL | 0 refills | Status: DC

## 2023-06-20 MED ORDER — FLUTICASONE PROPIONATE 50 MCG/ACT NA SUSP: 2.0000 | Freq: Every day | NASAL | 0 refills | Status: DC

## 2023-06-20 NOTE — ED Triage Notes (Addendum)
 Pt reports fever,cough, coughing up phlegm  pt states when she work up this morning she had fever. Sx's since Friday evening.

## 2023-06-20 NOTE — Discharge Instructions (Addendum)
 The COVID and flu test were negative. Take medication as prescribed.  You may take over-the-counter Coricidin HBP for your blood pressure if the cough medication prescribed is not effective. May take over-the-counter Tylenol  as needed for pain, fever, or general discomfort. Increase fluids and allow for plenty of rest. Normal saline nasal spray throughout the day to help with nasal congestion and runny nose. Recommend using humidifier in your bedroom at nighttime during sleep and sleeping elevated on pillows while cough symptoms persist. Symptoms should improve over the next several days.  If you notice new symptoms such as fever, wheezing, difficulty breathing, or other concerns, you may follow-up in this clinic or with your primary care physician for further evaluation. Follow-up as needed.

## 2023-06-20 NOTE — ED Provider Notes (Signed)
 [facility]    CSN: [id number] Arrival date & time: 06/20/23  1047      History   Chief Complaint Chief Complaint  Patient presents with   Fever    Entered by patient    HPI Allison Morris is a 88 y.o. female.   The history is provided by the patient.   Patient presents for complaints of fever, cough, and coughing up phlegm that started this morning.  Patient also reports that she had a fever.  Tmax around 99.  Patient later goes on to say the symptoms started 2 days ago.  She denies headache, ear pain, nasal congestion, wheezing, difficulty breathing, chest pain, abdominal pain, nausea, vomiting, diarrhea, or rash.  Patient reports that she has not taken any medication for her symptoms.  Denies any obvious known sick contacts.  Past Medical History:  Diagnosis Date   Aortic dilatation (HCC)    a. echo 08/2017 showed EF 65-70%, grade 1 DD, mild AI, mildly dilated ascending aorta (43mm), mild MR, mild TR.    Aortic regurgitation    Arthritis    CKD (chronic kidney disease), stage III (HCC)    Essential hypertension    GERD (gastroesophageal reflux disease)    History of DVT (deep vein thrombosis)    Left leg 1997    Hyperlipemia    Hypertensive urgency 02/05/2022   Mild aortic insufficiency    Mild mitral regurgitation    Mild tricuspid insufficiency    Seasonal allergies     Patient Active Problem List   Diagnosis Date Noted   Hypertensive urgency 02/05/2022   Pneumonia of both lungs due to infectious organism 07/20/2021   Acute respiratory failure with hypoxia (HCC) 07/20/2021   Stage 3b chronic kidney disease (CKD) (HCC) 07/20/2021   Mass of finger of right hand 05/30/2019   Osteoarthritis of both hands 05/30/2019   Orthostatic lightheadedness 12/11/2017   TIA (transient ischemic attack) 11/14/2015   Hypertension    Hyperlipemia    History of DVT (deep vein thrombosis)    Aortic dilatation [facility])     Past Surgical History:  Procedure Laterality  Date   APPENDECTOMY     CARPAL TUNNEL RELEASE     IR RADIOLOGIST EVAL & MGMT  01/21/2021   IR SACROPLASTY BILATERAL  12/18/2020   MASS EXCISION Right 08/07/2019   Procedure: EXCISION CYST RIGHT INDEX AND RIGHT MIDDLE FINGERS WITH DEBRIDEMENT OF METACARPALPHALANGEAL JOINTS;  Surgeon: Murrell Kuba, MD;  Location: [facility];  Service: Orthopedics;  Laterality: Right;   TONSILLECTOMY      OB History   No obstetric history on file.      Home Medications    Prior to Admission medications   Medication Sig Start Date End Date Taking? Authorizing Provider  acetaminophen  (TYLENOL ) 500 MG tablet Take 500 mg by mouth every 6 (six) hours as needed for mild pain or moderate pain.     [provider]  albuterol  (VENTOLIN  HFA) 108 (90 Base) MCG/ACT inhaler Inhale 1-2 puffs into the lungs every 6 (six) hours as needed for wheezing or shortness of breath. 11/30/21   Stuart Vernell Norris, PA-C  aspirin  EC 81 MG EC tablet Take 1 tablet (81 mg total) by mouth daily. 11/15/15   Ladora Coy, MD  Biotin 89999 MCG TABS Take 10,000 mcg by mouth daily.    [provider]  Cholecalciferol (VITAMIN D3) 1000 units CAPS Take 1,000 Units by mouth daily.    [provider]  clobetasol  cream (TEMOVATE) 0.05 % Apply 1 application topically daily as needed (for irritation).     [provider]  clopidogrel  (PLAVIX ) 75 MG tablet Take 1 tablet (75 mg total) by mouth daily. 11/15/15   Ladora Coy, MD  colchicine  0.6 MG tablet Take 0.6 mg by mouth daily.    [provider]  fexofenadine (ALLEGRA) 180 MG tablet Take 180 mg by mouth daily.    [provider]  Flax Oil-Fish Oil-Borage Oil (FISH-FLAX-BORAGE) CAPS Take 1 capsule by mouth 2 (two) times daily.    [provider]  fluticasone  (FLONASE ) 50 MCG/ACT nasal spray Place 2 sprays into both nostrils daily as needed for allergies.    [provider]  Glucosamine-Chondroit-Vit C-Mn (GLUCOSAMINE 1500  COMPLEX PO) Take 1 tablet by mouth daily.     [provider]  HYDROcodone -acetaminophen  (NORCO/VICODIN) 5-325 MG tablet Take 1 tablet by mouth every 6 (six) hours as needed. 06/03/21   Cleotilde Rogue, MD  lidocaine  (XYLOCAINE ) 2 % solution Use as directed 5 mLs in the mouth or throat every 6 (six) hours as needed for mouth pain. Gargle and spit 5mL every 6 hours as needed for severe throat pain. 06/13/22   Leath-Warren, Etta PARAS, NP  nebivolol  (BYSTOLIC ) 2.5 MG tablet Take 1 tablet (2.5 mg total) by mouth 2 (two) times daily. OK TO TAKE AN EXTRA TABLET AS NEEDED FOR BLOOD PRESSURE ABOVE 150 01/18/23   Debera Jayson MATSU, MD  pantoprazole  (PROTONIX ) 40 MG tablet Take 40 mg by mouth daily.     [provider]  Polyethyl Glycol-Propyl Glycol (SYSTANE OP) Place 1 drop into both eyes daily as needed (dry eyes).    [provider]  trolamine salicylate (ASPERCREME) 10 % cream Apply 1 application  topically as needed for muscle pain.    [provider]  vitamin C (ASCORBIC ACID) 500 MG tablet Take 500 mg by mouth 2 (two) times daily.    [provider]    Family History Family History  Problem Relation Age of Onset   Aneurysm Sister    Valvular heart disease Sister    Aneurysm Paternal Grandfather    Heart attack Paternal Grandfather     Social History Social History   Tobacco Use   Smoking status: Never   Smokeless tobacco: Never  Vaping Use   Vaping status: Never Used  Substance Use Topics   Alcohol use: No    Alcohol/week: 0.0 standard drinks of alcohol   Drug use: No     Allergies   Amlodipine, Biaxin [clarithromycin], Doxycycline , Estrogens, Felodipine , Hydralazine , Labetalol, Lisinopril, Losartan, Macrodantin [nitrofurantoin], Statins, Ceftin [cefuroxime], Chlorthalidone , Ciprofloxacin, and Sulfa antibiotics   Review of Systems Review of Systems Per HPI  Physical Exam Triage Vital Signs ED Triage Vitals  Encounter Vitals Group      BP 06/20/23 1104 (!) 188/82     Systolic BP Percentile --      Diastolic BP Percentile --      Pulse Rate 06/20/23 1104 73     Resp 06/20/23 1104 17     Temp 06/20/23 1104 97.7 F (36.5 C)     Temp Source 06/20/23 1104 Oral     SpO2 06/20/23 1104 95 %     Weight --      Height --      Head Circumference --      Peak Flow --      Pain Score 06/20/23 1108 0     Pain Loc --  Pain Education --      Exclude from Growth Chart --    No data found.  Updated Vital Signs BP (!) 188/82 (BP Location: Right Arm)   Pulse 73   Temp 97.7 F (36.5 C) (Oral)   Resp 17   SpO2 95%   Visual Acuity Right Eye Distance:   Left Eye Distance:   Bilateral Distance:    Right Eye Near:   Left Eye Near:    Bilateral Near:     Physical Exam Vitals and nursing note reviewed.  Constitutional:      General: She is not in acute distress.    Appearance: Normal appearance.  HENT:     Head: Normocephalic.     Right Ear: Tympanic membrane, ear canal and external ear normal.     Left Ear: Tympanic membrane, ear canal and external ear normal.     Nose: Congestion present.     Right Turbinates: Enlarged and swollen.     Left Turbinates: Enlarged and swollen.     Right Sinus: No maxillary sinus tenderness or frontal sinus tenderness.     Left Sinus: No maxillary sinus tenderness or frontal sinus tenderness.     Mouth/Throat:     Lips: Pink.     Mouth: Mucous membranes are moist.     Pharynx: Uvula midline. Postnasal drip present. No pharyngeal swelling or posterior oropharyngeal erythema.     Comments: Cobblestoning present to posterior oropharynx. Eyes:     Extraocular Movements: Extraocular movements intact.     Conjunctiva/sclera: Conjunctivae normal.     Pupils: Pupils are equal, round, and reactive to light.  Cardiovascular:     Rate and Rhythm: Normal rate and regular rhythm.     Pulses: Normal pulses.     Heart sounds: Normal heart sounds.  Pulmonary:     Effort: Pulmonary effort  is normal. No respiratory distress.     Breath sounds: Normal breath sounds. No stridor. No wheezing, rhonchi or rales.  Abdominal:     General: Bowel sounds are normal.     Palpations: Abdomen is soft.     Tenderness: There is no abdominal tenderness.  Musculoskeletal:     Cervical back: Normal range of motion.  Lymphadenopathy:     Cervical: No cervical adenopathy.  Skin:    General: Skin is warm and dry.  Neurological:     General: No focal deficit present.     Mental Status: She is alert and oriented to person, place, and time.  Psychiatric:        Mood and Affect: Mood normal.        Behavior: Behavior normal.      UC Treatments / Results  Labs (all labs ordered are listed, but only abnormal results are displayed) Labs Reviewed  POC COVID19/FLU A&B COMBO    EKG   Radiology No results found.  Procedures Procedures (including critical care time)  Medications Ordered in UC Medications - No data to display  Initial Impression / Assessment and Plan / UC Course  I have reviewed the triage vital signs and the nursing notes.  Pertinent labs & imaging results that were available during my care of the patient were reviewed by me and considered in my medical decision making (see chart for details).  On exam, lung sounds are clear throughout, room air sats at 95%.  COVID/flu test was negative.  Suspect a viral URI with cough.  Will treat with fluticasone  50 mcg nasal spray for nasal congestion, and  for her cough, Tessalon  pearls 100 mg prescribed.  Supportive care recommendations were provided and discussed with the patient to include fluids, rest, over-the-counter Tylenol , and use of over-the-counter Coricidin HBP for cough if Tessalon  is not effective.  Discussed indications with the patient regarding follow-up.  Patient was in agreement with this plan of care and verbalizes understanding.  All questions were answered.  Patient stable for discharge.  Final Clinical  Impressions(s) / UC Diagnoses   Final diagnoses:  None   Discharge Instructions   None    ED Prescriptions   None    PDMP not reviewed this encounter.   Gilmer Etta PARAS, NP 06/20/23 1454

## 2023-07-14 DIAGNOSIS — E039 Hypothyroidism, unspecified: Secondary | ICD-10-CM | POA: Diagnosis not present

## 2023-07-14 DIAGNOSIS — M159 Polyosteoarthritis, unspecified: Secondary | ICD-10-CM | POA: Diagnosis not present

## 2023-07-14 DIAGNOSIS — G9332 Myalgic encephalomyelitis/chronic fatigue syndrome: Secondary | ICD-10-CM | POA: Diagnosis not present

## 2023-07-14 DIAGNOSIS — E559 Vitamin D deficiency, unspecified: Secondary | ICD-10-CM | POA: Diagnosis not present

## 2023-07-14 DIAGNOSIS — M810 Age-related osteoporosis without current pathological fracture: Secondary | ICD-10-CM | POA: Diagnosis not present

## 2023-07-14 DIAGNOSIS — I10 Essential (primary) hypertension: Secondary | ICD-10-CM | POA: Diagnosis not present

## 2023-07-14 DIAGNOSIS — D518 Other vitamin B12 deficiency anemias: Secondary | ICD-10-CM | POA: Diagnosis not present

## 2023-07-14 DIAGNOSIS — I129 Hypertensive chronic kidney disease with stage 1 through stage 4 chronic kidney disease, or unspecified chronic kidney disease: Secondary | ICD-10-CM | POA: Diagnosis not present

## 2023-07-14 DIAGNOSIS — Z681 Body mass index (BMI) 19 or less, adult: Secondary | ICD-10-CM | POA: Diagnosis not present

## 2023-07-14 DIAGNOSIS — Z0001 Encounter for general adult medical examination with abnormal findings: Secondary | ICD-10-CM | POA: Diagnosis not present

## 2023-07-14 DIAGNOSIS — Z1331 Encounter for screening for depression: Secondary | ICD-10-CM | POA: Diagnosis not present

## 2023-07-14 DIAGNOSIS — M1991 Primary osteoarthritis, unspecified site: Secondary | ICD-10-CM | POA: Diagnosis not present

## 2023-07-14 DIAGNOSIS — N1832 Chronic kidney disease, stage 3b: Secondary | ICD-10-CM | POA: Diagnosis not present

## 2023-07-21 ENCOUNTER — Other Ambulatory Visit (HOSPITAL_COMMUNITY): Payer: Self-pay | Admitting: Family Medicine

## 2023-07-21 DIAGNOSIS — E039 Hypothyroidism, unspecified: Secondary | ICD-10-CM | POA: Diagnosis not present

## 2023-07-21 DIAGNOSIS — Z6820 Body mass index (BMI) 20.0-20.9, adult: Secondary | ICD-10-CM | POA: Diagnosis not present

## 2023-07-21 DIAGNOSIS — R1904 Left lower quadrant abdominal swelling, mass and lump: Secondary | ICD-10-CM

## 2023-07-25 ENCOUNTER — Telehealth: Payer: Self-pay | Admitting: *Deleted

## 2023-07-25 NOTE — Telephone Encounter (Signed)
Med Rec complete, allergies and Pharmacy verified July 25 2023.

## 2023-07-27 ENCOUNTER — Encounter: Payer: Self-pay | Admitting: Cardiology

## 2023-07-27 ENCOUNTER — Ambulatory Visit: Payer: Medicare Other | Attending: Cardiology | Admitting: Cardiology

## 2023-07-27 VITALS — BP 134/72 | HR 62 | Ht 65.0 in | Wt 113.8 lb

## 2023-07-27 DIAGNOSIS — I10 Essential (primary) hypertension: Secondary | ICD-10-CM

## 2023-07-27 DIAGNOSIS — I351 Nonrheumatic aortic (valve) insufficiency: Secondary | ICD-10-CM

## 2023-07-27 DIAGNOSIS — N1832 Chronic kidney disease, stage 3b: Secondary | ICD-10-CM | POA: Diagnosis not present

## 2023-07-27 DIAGNOSIS — I77819 Aortic ectasia, unspecified site: Secondary | ICD-10-CM | POA: Diagnosis not present

## 2023-07-27 MED ORDER — NEBIVOLOL HCL 2.5 MG PO TABS: 2.5000 mg | ORAL_TABLET | Freq: Two times a day (BID) | ORAL | 2 refills | Status: DC

## 2023-07-27 NOTE — Progress Notes (Signed)
    Cardiology Office Note  Date: 07/27/2023   ID: SURABHI GADEA, DOB 06-Jul-1935, MRN [id number]  History of Present Illness: Allison Morris is an 88 y.o. female last seen in August 2024.  She is here today for a routine visit.  Reports no exertional chest pain, stable NYHA class II dyspnea, no palpitations or syncope.  She uses a cane to ambulate, does not report any falls.  Still lives in her own home.  We went over her medications today.  She remains on Bystolic 2.5 mg twice daily, blood pressure check is reasonable today.  Otherwise follows with Dr. Sherwood Gambler and had recent lab work which is noted below.  Physical Exam: VS:  BP 134/72   Pulse 62   Ht 5\' 5"  (1.651 m)   Wt 113 lb 12.8 oz (51.6 kg)   BMI 18.94 kg/m , BMI Body mass index is 18.94 kg/m.  Wt Readings from Last 3 Encounters:  07/27/23 113 lb 12.8 oz (51.6 kg)  01/18/23 118 lb (53.5 kg)  07/14/22 113 lb (51.3 kg)    General: Patient appears comfortable at rest. HEENT: Conjunctiva and lids normal. Neck: Supple, no elevated JVP or carotid bruits. Lungs: Clear to auscultation, nonlabored breathing at rest. Cardiac: Regular rate and rhythm, no S3, 1/6 systolic murmur. Extremities: No pitting edema.  ECG:  An ECG dated 01/18/2023 was personally reviewed today and demonstrated:  Sinus bradycardia.  Labwork: February 2025: Cholesterol 153, triglycerides 99, HDL 49, LDL 86, TSH 8.32, hemoglobin 12.5, platelets 205, BUN 31, creatinine 1.71, GFR 29, potassium 5.2, AST 27, ALT 17 11/02/2022: ALT 18; AST 25; BUN 18; Creatinine, Ser 1.50; Hemoglobin 15.2; Platelets 161; Potassium 4.7; Sodium 134   Other Studies Reviewed Today:  No interval cardiac testing for review today.  Assessment and Plan:  1.  Primary hypertension.  Difficult to control, but no secondary causes based on prior workup in the hypertension clinic (no clear evidence of pheochromocytoma, hyperaldosteronism, or renal artery stenosis).  Blood pressure control  is reasonable today, continue Bystolic 2.5 mg twice daily, refills provided.   2.  CKD stage IIIb.  Recent lab work previous PCP shows creatinine 1.71 and GFR 29.   3.  Ascending aortic dilatation and aortic regurgitation.  Echocardiogram in July 2023 revealed ascending aorta 43 mm with mild aortic regurgitation.  Disposition:  Follow up  6 months.  Signed, Jonelle Sidle, M.D., F.A.C.C. [facility] at [facility]

## 2023-07-27 NOTE — Patient Instructions (Signed)

## 2023-08-08 DIAGNOSIS — E039 Hypothyroidism, unspecified: Secondary | ICD-10-CM | POA: Diagnosis not present

## 2023-08-26 ENCOUNTER — Ambulatory Visit (HOSPITAL_COMMUNITY)
Admission: RE | Admit: 2023-08-26 | Discharge: 2023-08-26 | Disposition: A | Discharge status: Home / Self Care | Source: Ambulatory Visit | Attending: Family Medicine | Admitting: Family Medicine

## 2023-08-26 DIAGNOSIS — R1904 Left lower quadrant abdominal swelling, mass and lump: Secondary | ICD-10-CM | POA: Insufficient documentation

## 2023-08-26 DIAGNOSIS — R932 Abnormal findings on diagnostic imaging of liver and biliary tract: Secondary | ICD-10-CM | POA: Diagnosis not present

## 2023-08-26 DIAGNOSIS — R1032 Left lower quadrant pain: Secondary | ICD-10-CM | POA: Diagnosis not present

## 2023-08-26 DIAGNOSIS — K8020 Calculus of gallbladder without cholecystitis without obstruction: Secondary | ICD-10-CM | POA: Diagnosis not present

## 2023-08-30 DIAGNOSIS — N1832 Chronic kidney disease, stage 3b: Secondary | ICD-10-CM | POA: Diagnosis not present

## 2023-09-02 DIAGNOSIS — Z6820 Body mass index (BMI) 20.0-20.9, adult: Secondary | ICD-10-CM | POA: Diagnosis not present

## 2023-09-02 DIAGNOSIS — R600 Localized edema: Secondary | ICD-10-CM | POA: Diagnosis not present

## 2023-09-02 DIAGNOSIS — J069 Acute upper respiratory infection, unspecified: Secondary | ICD-10-CM | POA: Diagnosis not present

## 2023-09-02 DIAGNOSIS — E039 Hypothyroidism, unspecified: Secondary | ICD-10-CM | POA: Diagnosis not present

## 2023-09-02 DIAGNOSIS — R0600 Dyspnea, unspecified: Secondary | ICD-10-CM | POA: Diagnosis not present

## 2023-09-07 DIAGNOSIS — N189 Chronic kidney disease, unspecified: Secondary | ICD-10-CM | POA: Diagnosis not present

## 2023-09-07 DIAGNOSIS — D631 Anemia in chronic kidney disease: Secondary | ICD-10-CM | POA: Diagnosis not present

## 2023-09-07 DIAGNOSIS — N2581 Secondary hyperparathyroidism of renal origin: Secondary | ICD-10-CM | POA: Diagnosis not present

## 2023-09-07 DIAGNOSIS — N1832 Chronic kidney disease, stage 3b: Secondary | ICD-10-CM | POA: Diagnosis not present

## 2023-09-07 DIAGNOSIS — I129 Hypertensive chronic kidney disease with stage 1 through stage 4 chronic kidney disease, or unspecified chronic kidney disease: Secondary | ICD-10-CM | POA: Diagnosis not present

## 2023-09-20 DIAGNOSIS — N1832 Chronic kidney disease, stage 3b: Secondary | ICD-10-CM | POA: Diagnosis not present

## 2023-10-17 DIAGNOSIS — M109 Gout, unspecified: Secondary | ICD-10-CM | POA: Diagnosis not present

## 2023-10-17 DIAGNOSIS — D6869 Other thrombophilia: Secondary | ICD-10-CM | POA: Diagnosis not present

## 2023-10-28 DIAGNOSIS — N1832 Chronic kidney disease, stage 3b: Secondary | ICD-10-CM | POA: Diagnosis not present

## 2023-11-03 DIAGNOSIS — M5416 Radiculopathy, lumbar region: Secondary | ICD-10-CM | POA: Diagnosis not present

## 2023-11-05 DIAGNOSIS — E039 Hypothyroidism, unspecified: Secondary | ICD-10-CM | POA: Diagnosis not present

## 2023-11-05 DIAGNOSIS — I129 Hypertensive chronic kidney disease with stage 1 through stage 4 chronic kidney disease, or unspecified chronic kidney disease: Secondary | ICD-10-CM | POA: Diagnosis not present

## 2023-11-05 DIAGNOSIS — K219 Gastro-esophageal reflux disease without esophagitis: Secondary | ICD-10-CM | POA: Diagnosis not present

## 2023-11-10 DIAGNOSIS — N184 Chronic kidney disease, stage 4 (severe): Secondary | ICD-10-CM | POA: Diagnosis not present

## 2023-11-10 DIAGNOSIS — D631 Anemia in chronic kidney disease: Secondary | ICD-10-CM | POA: Diagnosis not present

## 2023-11-10 DIAGNOSIS — I129 Hypertensive chronic kidney disease with stage 1 through stage 4 chronic kidney disease, or unspecified chronic kidney disease: Secondary | ICD-10-CM | POA: Diagnosis not present

## 2023-11-10 DIAGNOSIS — N2581 Secondary hyperparathyroidism of renal origin: Secondary | ICD-10-CM | POA: Diagnosis not present

## 2023-11-26 DIAGNOSIS — M48062 Spinal stenosis, lumbar region with neurogenic claudication: Secondary | ICD-10-CM | POA: Diagnosis not present

## 2023-12-02 ENCOUNTER — Ambulatory Visit
Admission: RE | Admit: 2023-12-02 | Discharge: 2023-12-02 | Disposition: A | Discharge status: Home / Self Care | Source: Ambulatory Visit | Attending: Family Medicine | Admitting: Family Medicine

## 2023-12-02 VITALS — BP 188/83 | HR 53 | Temp 97.7°F | Resp 18

## 2023-12-02 DIAGNOSIS — J22 Unspecified acute lower respiratory infection: Secondary | ICD-10-CM

## 2023-12-02 DIAGNOSIS — R030 Elevated blood-pressure reading, without diagnosis of hypertension: Secondary | ICD-10-CM

## 2023-12-02 DIAGNOSIS — R062 Wheezing: Secondary | ICD-10-CM | POA: Diagnosis not present

## 2023-12-02 MED ORDER — GUAIFENESIN ER 600 MG PO TB12: 600.0000 mg | ORAL_TABLET | Freq: Two times a day (BID) | ORAL | 0 refills | Status: DC

## 2023-12-02 MED ORDER — DOXYCYCLINE HYCLATE 100 MG PO CAPS: 100.0000 mg | ORAL_CAPSULE | Freq: Two times a day (BID) | ORAL | 0 refills | Status: DC

## 2023-12-02 MED ORDER — ALBUTEROL SULFATE HFA 108 (90 BASE) MCG/ACT IN AERS: 2.0000 | INHALATION_SPRAY | RESPIRATORY_TRACT | 0 refills | Status: DC | PRN

## 2023-12-02 NOTE — Discharge Instructions (Signed)
 I have prescribed an antibiotic, albuterol  inhaler and Mucinex  to help with your current symptoms.  You may also take over-the-counter remedies such as Flonase , saline sinus rinses, humidifiers.  Follow-up for significantly worsening symptoms.  Continue monitoring your home blood pressures, it was significantly elevated today in clinic.  Follow-up with your primary care provider or cardiologist if persistently high readings

## 2023-12-02 NOTE — ED Provider Notes (Signed)
 [facility]    CSN: [id number] Arrival date & time: 12/02/23  1446      History   Chief Complaint Chief Complaint  Patient presents with   Cough    Coughing up colored sputum, no known fever - Entered by patient    HPI Allison Morris is a 88 y.o. female.   Patient presenting today with 1 week history of progressively worsening productive cough, wheezing.  Denies fever, chills, chest pain, shortness of breath, abdominal pain, vomiting, diarrhea.  So far not trying anything over-the-counter for symptoms.  No known history of chronic pulmonary disease or known sick contacts recently.    Past Medical History:  Diagnosis Date   Aortic dilatation (HCC)    a. echo 08/2017 showed EF 65-70%, grade 1 DD, mild AI, mildly dilated ascending aorta (43mm), mild MR, mild TR.    Aortic regurgitation    Arthritis    CKD (chronic kidney disease), stage III (HCC)    Essential hypertension    GERD (gastroesophageal reflux disease)    History of DVT (deep vein thrombosis)    Left leg 1997    Hyperlipemia    Hypertensive urgency 02/05/2022   Mild aortic insufficiency    Mild mitral regurgitation    Mild tricuspid insufficiency    Seasonal allergies     Patient Active Problem List   Diagnosis Date Noted   Hypertensive urgency 02/05/2022   Pneumonia of both lungs due to infectious organism 07/20/2021   Acute respiratory failure with hypoxia (HCC) 07/20/2021   Stage 3b chronic kidney disease (CKD) (HCC) 07/20/2021   Mass of finger of right hand 05/30/2019   Osteoarthritis of both hands 05/30/2019   Orthostatic lightheadedness 12/11/2017   TIA (transient ischemic attack) 11/14/2015   Hypertension    Hyperlipemia    History of DVT (deep vein thrombosis)    Aortic dilatation [facility])     Past Surgical History:  Procedure Laterality Date   APPENDECTOMY     CARPAL TUNNEL RELEASE     IR RADIOLOGIST EVAL & MGMT  01/21/2021   IR SACROPLASTY BILATERAL  12/18/2020   MASS EXCISION  Right 08/07/2019   Procedure: EXCISION CYST RIGHT INDEX AND RIGHT MIDDLE FINGERS WITH DEBRIDEMENT OF METACARPALPHALANGEAL JOINTS;  Surgeon: Murrell Kuba, MD;  Location: [facility];  Service: Orthopedics;  Laterality: Right;   TONSILLECTOMY      OB History   No obstetric history on file.      Home Medications    Prior to Admission medications   Medication Sig Start Date End Date Taking? Authorizing Provider  albuterol  (VENTOLIN  HFA) 108 (90 Base) MCG/ACT inhaler Inhale 2 puffs into the lungs every 4 (four) hours as needed. 12/02/23  Yes Stuart Vernell Norris, PA-C  doxycycline (VIBRAMYCIN) 100 MG capsule Take 1 capsule (100 mg total) by mouth 2 (two) times daily. 12/02/23  Yes Stuart Vernell Norris, PA-C  guaiFENesin  (MUCINEX ) 600 MG 12 hr tablet Take 1 tablet (600 mg total) by mouth 2 (two) times daily. 12/02/23  Yes Stuart Vernell Norris, PA-C  acetaminophen  (TYLENOL ) 500 MG tablet Take 500 mg by mouth every 6 (six) hours as needed for mild pain or moderate pain.     [provider]  aspirin  EC 81 MG EC tablet Take 1 tablet (81 mg total) by mouth daily. 11/15/15   Ladora Coy, MD  Biotin 89999 MCG TABS Take 10,000 mcg by mouth daily.    [provider]  Cholecalciferol (VITAMIN D3) 1000 units CAPS  Take 1,000 Units by mouth daily.    [provider]  clobetasol cream (TEMOVATE) 0.05 % Apply 1 application topically daily as needed (for irritation).     [provider]  clopidogrel  (PLAVIX ) 75 MG tablet Take 1 tablet (75 mg total) by mouth daily. 11/15/15   Ladora Coy, MD  colchicine  0.6 MG tablet Take 0.6 mg by mouth daily.    [provider]  fexofenadine (ALLEGRA) 180 MG tablet Take 180 mg by mouth daily.    [provider]  Flax Oil-Fish Oil-Borage Oil (FISH-FLAX-BORAGE) CAPS Take 1 capsule by mouth 2 (two) times daily.    [provider]  fluticasone  (FLONASE ) 50 MCG/ACT nasal spray Place 2 sprays into both nostrils  daily. 06/20/23   Leath-Warren, Etta PARAS, NP  nebivolol  (BYSTOLIC ) 2.5 MG tablet Take 1 tablet (2.5 mg total) by mouth 2 (two) times daily. OK TO TAKE AN EXTRA TABLET AS NEEDED FOR BLOOD PRESSURE ABOVE 150 07/27/23   Debera Jayson MATSU, MD  pantoprazole  (PROTONIX ) 40 MG tablet Take 40 mg by mouth daily.     [provider]  Polyethyl Glycol-Propyl Glycol (SYSTANE OP) Place 1 drop into both eyes daily as needed (dry eyes).    [provider]  traMADol  (ULTRAM ) 50 MG tablet Take 50 mg by mouth every 6 (six) hours as needed.    [provider]  trolamine salicylate (ASPERCREME) 10 % cream Apply 1 application  topically as needed for muscle pain.    [provider]  vitamin C (ASCORBIC ACID) 500 MG tablet Take 500 mg by mouth 2 (two) times daily.    [provider]    Family History Family History  Problem Relation Age of Onset   Aneurysm Sister    Valvular heart disease Sister    Aneurysm Paternal Grandfather    Heart attack Paternal Grandfather     Social History Social History   Tobacco Use   Smoking status: Never   Smokeless tobacco: Never  Vaping Use   Vaping status: Never Used  Substance Use Topics   Alcohol use: No    Alcohol/week: 0.0 standard drinks of alcohol   Drug use: No     Allergies   Amlodipine, Biaxin [clarithromycin], Doxycycline, Estrogens, Felodipine , Hydralazine , Labetalol, Levothyroxine, Lisinopril, Losartan, Macrodantin [nitrofurantoin], Statins, Ceftin [cefuroxime], Chlorthalidone , Ciprofloxacin, and Sulfa antibiotics   Review of Systems Review of Systems Per HPI  Physical Exam Triage Vital Signs ED Triage Vitals  Encounter Vitals Group     BP 12/02/23 1506 (!) 210/85     Girls Systolic BP Percentile --      Girls Diastolic BP Percentile --      Boys Systolic BP Percentile --      Boys Diastolic BP Percentile --      Pulse Rate 12/02/23 1506 63     Resp 12/02/23 1506 18     Temp 12/02/23 1506 97.7 F  (36.5 C)     Temp Source 12/02/23 1506 Oral     SpO2 12/02/23 1506 92 %     Weight --      Height --      Head Circumference --      Peak Flow --      Pain Score 12/02/23 1509 0     Pain Loc --      Pain Education --      Exclude from Growth Chart --    No data found.  Updated Vital Signs BP (!) 188/83 (BP Location:  Right Arm)   Pulse (!) 53   Temp 97.7 F (36.5 C) (Oral)   Resp 18   SpO2 96%   Visual Acuity Right Eye Distance:   Left Eye Distance:   Bilateral Distance:    Right Eye Near:   Left Eye Near:    Bilateral Near:     Physical Exam Vitals and nursing note reviewed.  Constitutional:      Appearance: Normal appearance.  HENT:     Head: Atraumatic.     Right Ear: Tympanic membrane and external ear normal.     Left Ear: Tympanic membrane and external ear normal.     Nose: Rhinorrhea present.     Mouth/Throat:     Mouth: Mucous membranes are moist.     Pharynx: Posterior oropharyngeal erythema present.   Eyes:     Extraocular Movements: Extraocular movements intact.     Conjunctiva/sclera: Conjunctivae normal.    Cardiovascular:     Rate and Rhythm: Normal rate and regular rhythm.  Pulmonary:     Effort: Pulmonary effort is normal.     Breath sounds: Wheezing present.   Musculoskeletal:        General: Normal range of motion.     Cervical back: Normal range of motion and neck supple.   Skin:    General: Skin is warm and dry.   Neurological:     Mental Status: She is alert and oriented to person, place, and time.   Psychiatric:        Mood and Affect: Mood normal.        Thought Content: Thought content normal.      UC Treatments / Results  Labs (all labs ordered are listed, but only abnormal results are displayed) Labs Reviewed - No data to display  EKG   Radiology No results found.  Procedures Procedures (including critical care time)  Medications Ordered in UC Medications - No data to display  Initial Impression /  Assessment and Plan / UC Course  I have reviewed the triage vital signs and the nursing notes.  Pertinent labs & imaging results that were available during my care of the patient were reviewed by me and considered in my medical decision making (see chart for details).     Given duration and worsening course as well as wheezing, will treat with albuterol , doxycycline, Mucinex .  She does have an intolerance of headache listed for doxycycline but she states it is tolerable and given her numerous other allergies she feels this is the most tolerable 1.  She was also noted to have significantly elevated blood pressure readings in clinic, states her home readings have been within normal limits, around 120 over 80s range over the last 2 days.  Discussed to keep a close eye on this and follow-up with primary care if not normalizing.  Return for worsening symptoms.  Final Clinical Impressions(s) / UC Diagnoses   Final diagnoses:  Lower respiratory infection  Wheezing  Elevated blood pressure reading     Discharge Instructions      I have prescribed an antibiotic, albuterol  inhaler and Mucinex  to help with your current symptoms.  You may also take over-the-counter remedies such as Flonase , saline sinus rinses, humidifiers.  Follow-up for significantly worsening symptoms.  Continue monitoring your home blood pressures, it was significantly elevated today in clinic.  Follow-up with your primary care provider or cardiologist if persistently high readings    ED Prescriptions     Medication Sig Dispense Auth. Provider  albuterol  (VENTOLIN  HFA) 108 (90 Base) MCG/ACT inhaler Inhale 2 puffs into the lungs every 4 (four) hours as needed. 18 g Stuart Vernell Norris, PA-C   guaiFENesin  (MUCINEX ) 600 MG 12 hr tablet Take 1 tablet (600 mg total) by mouth 2 (two) times daily. 20 tablet Stuart Vernell Norris, PA-C   doxycycline (VIBRAMYCIN) 100 MG capsule Take 1 capsule (100 mg total) by mouth 2 (two) times  daily. 14 capsule Stuart Vernell Norris, [place]      PDMP not reviewed this encounter.   Stuart Vernell Norris, PA-C 12/02/23 [phone]

## 2023-12-02 NOTE — ED Triage Notes (Signed)
Productive cough x 1 week.

## 2023-12-13 DIAGNOSIS — Z681 Body mass index (BMI) 19 or less, adult: Secondary | ICD-10-CM | POA: Diagnosis not present

## 2023-12-13 DIAGNOSIS — Z23 Encounter for immunization: Secondary | ICD-10-CM | POA: Diagnosis not present

## 2023-12-13 DIAGNOSIS — N1832 Chronic kidney disease, stage 3b: Secondary | ICD-10-CM | POA: Diagnosis not present

## 2023-12-13 DIAGNOSIS — E039 Hypothyroidism, unspecified: Secondary | ICD-10-CM | POA: Diagnosis not present

## 2023-12-13 DIAGNOSIS — I10 Essential (primary) hypertension: Secondary | ICD-10-CM | POA: Diagnosis not present

## 2023-12-13 DIAGNOSIS — M810 Age-related osteoporosis without current pathological fracture: Secondary | ICD-10-CM | POA: Diagnosis not present

## 2023-12-13 DIAGNOSIS — M159 Polyosteoarthritis, unspecified: Secondary | ICD-10-CM | POA: Diagnosis not present

## 2023-12-13 DIAGNOSIS — I129 Hypertensive chronic kidney disease with stage 1 through stage 4 chronic kidney disease, or unspecified chronic kidney disease: Secondary | ICD-10-CM | POA: Diagnosis not present

## 2023-12-13 DIAGNOSIS — K219 Gastro-esophageal reflux disease without esophagitis: Secondary | ICD-10-CM | POA: Diagnosis not present

## 2023-12-26 ENCOUNTER — Ambulatory Visit (INDEPENDENT_AMBULATORY_CARE_PROVIDER_SITE_OTHER): Admitting: Primary Care

## 2024-01-17 DIAGNOSIS — N189 Chronic kidney disease, unspecified: Secondary | ICD-10-CM | POA: Diagnosis not present

## 2024-01-17 DIAGNOSIS — Z8739 Personal history of other diseases of the musculoskeletal system and connective tissue: Secondary | ICD-10-CM | POA: Diagnosis not present

## 2024-01-17 DIAGNOSIS — M48061 Spinal stenosis, lumbar region without neurogenic claudication: Secondary | ICD-10-CM | POA: Diagnosis not present

## 2024-01-17 DIAGNOSIS — E782 Mixed hyperlipidemia: Secondary | ICD-10-CM | POA: Diagnosis not present

## 2024-01-17 DIAGNOSIS — I129 Hypertensive chronic kidney disease with stage 1 through stage 4 chronic kidney disease, or unspecified chronic kidney disease: Secondary | ICD-10-CM | POA: Diagnosis not present

## 2024-01-17 DIAGNOSIS — I10 Essential (primary) hypertension: Secondary | ICD-10-CM | POA: Diagnosis not present

## 2024-01-19 DIAGNOSIS — M5416 Radiculopathy, lumbar region: Secondary | ICD-10-CM | POA: Diagnosis not present

## 2024-01-20 ENCOUNTER — Other Ambulatory Visit: Payer: Self-pay | Admitting: Family Medicine

## 2024-01-20 ENCOUNTER — Inpatient Hospital Stay
Admission: RE | Admit: 2024-01-20 | Discharge: 2024-01-20 | Disposition: A | Discharge status: Home / Self Care | Payer: Self-pay | Source: Ambulatory Visit | Attending: Neurosurgery | Admitting: Neurosurgery

## 2024-01-20 DIAGNOSIS — Z049 Encounter for examination and observation for unspecified reason: Secondary | ICD-10-CM

## 2024-01-20 NOTE — Progress Notes (Unsigned)
 Referring Physician:  Bertell Satterfield, MD [address] [place],  [place] [address]  Primary Physician:  Bertell Satterfield, MD  History of Present Illness: 01/26/2024 [name] is here today with a chief complaint of chronic back pain. She was referred by her son-in-law, Oneil Salt, for evaluation of her back pain.  She has experienced back pain for approximately 25 years, primarily originating in her hip, with variable intensity affecting her ability to walk. She is unable to walk long distances and standing for extended periods is problematic.  She has not engaged in physical therapy but has received injections in the past. Pain pills are ineffective, though Tylenol provides temporary relief. She has tried various medications for bone density but experienced adverse effects, leading her to discontinue them.  She experiences pain in her buttocks and has had numbness in her foot. No numbness or tingling in her legs since discontinuing the shots. Despite these issues, she remains active, engaging in activities like yard work, although she no longer drives due to her foot numbness.  Her past medical history includes stage three kidney disease, which is a consideration in her treatment options.  Bowel/Bladder Dysfunction: none  Conservative measures:  Physical therapy:  has not participated in Multimodal medical therapy including regular antiinflammatories:  Blue Emu Topical ointment, Tylenol, Tramadol Injections: 11/03/2023 right L4-L5 nerve block        8/14/205-also at [facility]-waiting on notes  Past Surgery: none  GEORGETTE Morris has no symptoms of cervical myelopathy.  The symptoms are causing a significant impact on the patient's life.   I have utilized the care everywhere function in epic to review the outside records available from external health systems.   Review of Systems:  A 10 point review of systems is negative, except for the pertinent  positives and negatives detailed in the HPI.  Past Medical History: Past Medical History:  Diagnosis Date   Aortic dilatation (HCC)    a. echo 08/2017 showed EF 65-70%, grade 1 DD, mild AI, mildly dilated ascending aorta (43mm), mild MR, mild TR.    Aortic regurgitation    Arthritis    CKD (chronic kidney disease), stage III (HCC)    Essential hypertension    GERD (gastroesophageal reflux disease)    History of DVT (deep vein thrombosis)    Left leg 1997    Hyperlipemia    Hypertensive urgency 02/05/2022   Mild aortic insufficiency    Mild mitral regurgitation    Mild tricuspid insufficiency    Seasonal allergies     Past Surgical History: Past Surgical History:  Procedure Laterality Date   APPENDECTOMY     CARPAL TUNNEL RELEASE     IR RADIOLOGIST EVAL & MGMT  01/21/2021   IR SACROPLASTY BILATERAL  12/18/2020   MASS EXCISION Right 08/07/2019   Procedure: EXCISION CYST RIGHT INDEX AND RIGHT MIDDLE FINGERS WITH DEBRIDEMENT OF METACARPALPHALANGEAL JOINTS;  Surgeon: Allison Kuba, MD;  Location: [facility];  Service: Orthopedics;  Laterality: Right;   TONSILLECTOMY      Allergies: Allergies as of 01/26/2024 - Review Complete 12/02/2023  Allergen Reaction Noted   Amlodipine  12/21/2017   Biaxin [clarithromycin]  07/31/2014   Doxycycline  07/31/2014   Estrogens  07/31/2014   Felodipine  12/23/2017   Hydralazine Other (See Comments) 02/05/2022   Labetalol  12/21/2017   Levothyroxine  12/02/2023   Lisinopril Cough 04/24/2019   Losartan  04/24/2019   Macrodantin [nitrofurantoin]  07/31/2014  Statins  07/31/2014   Ceftin [cefuroxime] Palpitations and Other (See Comments)    Chlorthalidone  04/24/2019   Ciprofloxacin Rash    Sulfa antibiotics Itching     Medications:  Current Outpatient Medications:    acetaminophen (TYLENOL) 500 MG tablet, Take 500 mg by mouth every 6 (six) hours as needed for mild pain or moderate pain. , Disp: , Rfl:    aspirin EC 81 MG EC  tablet, Take 1 tablet (81 mg total) by mouth daily., Disp: 100 tablet, Rfl: 1   Biotin 10000 MCG TABS, Take 10,000 mcg by mouth daily., Disp: , Rfl:    Cholecalciferol (VITAMIN D3) 1000 units CAPS, Take 1,000 Units by mouth daily., Disp: , Rfl:    clobetasol cream (TEMOVATE) 0.05 %, Apply 1 application topically daily as needed (for irritation). , Disp: , Rfl:    clopidogrel (PLAVIX) 75 MG tablet, Take 1 tablet (75 mg total) by mouth daily., Disp: 30 tablet, Rfl: 2   colchicine 0.6 MG tablet, Take 0.6 mg by mouth daily., Disp: , Rfl:    fexofenadine (ALLEGRA) 180 MG tablet, Take 180 mg by mouth daily., Disp: , Rfl:    Flax Oil-Fish Oil-Borage Oil (FISH-FLAX-BORAGE) CAPS, Take 1 capsule by mouth 2 (two) times daily., Disp: , Rfl:    nebivolol (BYSTOLIC) 2.5 MG tablet, Take 1 tablet (2.5 mg total) by mouth 2 (two) times daily. OK TO TAKE AN EXTRA TABLET AS NEEDED FOR BLOOD PRESSURE ABOVE 150, Disp: 180 tablet, Rfl: 2   pantoprazole (PROTONIX) 40 MG tablet, Take 40 mg by mouth daily. , Disp: , Rfl:    Polyethyl Glycol-Propyl Glycol (SYSTANE OP), Place 1 drop into both eyes daily as needed (dry eyes)., Disp: , Rfl:    trolamine salicylate (ASPERCREME) 10 % cream, Apply 1 application  topically as needed for muscle pain., Disp: , Rfl:    vitamin C (ASCORBIC ACID) 500 MG tablet, Take 500 mg by mouth 2 (two) times daily., Disp: , Rfl:   Social History: Social History   Tobacco Use   Smoking status: Never   Smokeless tobacco: Never  Vaping Use   Vaping status: Never Used  Substance Use Topics   Alcohol use: No    Alcohol/week: 0.0 standard drinks of alcohol   Drug use: No    Family Medical History: Family History  Problem Relation Age of Onset   Aneurysm Sister    Valvular heart disease Sister    Aneurysm Paternal Grandfather    Heart attack Paternal Grandfather     Physical Examination: Vitals:   01/26/24 1103  BP: 124/86    General: Patient is in no apparent distress. Attention  to examination is appropriate.  Neck:   Supple.  Full range of motion.  Respiratory: Patient is breathing without any difficulty.   NEUROLOGICAL:     Awake, alert, oriented to person, place, and time.  Speech is clear and fluent.   Cranial Nerves: Pupils equal round and reactive to light.  Facial tone is symmetric.  Facial sensation is symmetric. Shoulder shrug is symmetric. Tongue protrusion is midline.  There is no pronator drift.  Strength: Side Biceps Triceps Deltoid Interossei Grip Wrist Ext. Wrist Flex.  R 5 5 5 5 5 5 5   L 5 5 5 5 5 5 5    Side Iliopsoas Quads Hamstring PF DF EHL  R 5 5 5 5 5 5   L 5 5 5 5 5 5    Reflexes are 1+ and symmetric at the biceps, triceps,  brachioradialis, patella and achilles.   Hoffman's is absent.   Bilateral upper and lower extremity sensation is intact to light touch.    No evidence of dysmetria noted.  Gait is abnormal - requires cane.     Medical Decision Making  Imaging: MRI lumbar spine on November 24, 2022 shows advanced multilevel lumbar spondylosis with grade 1-2 anterolisthesis of L4 and L5.  She has severe stenosis at L4-5 and mild to moderate stenosis at L3-4.  I have personally reviewed the images and agree with the above interpretation.  Assessment and Plan: Ms. Statzer is a pleasant 88 y.o. female with spondylolisthesis of L4 and L5 causing back pain with primarily right sided sciatica.  She has been having worsening symptoms for the past 25 years.  Lumbar spondylolisthesis with nerve root compression Chronic spondylolisthesis with nerve root compression confirmed by MRI. Conservative treatments discussed. Surgery considered but risks increased due to age and bone density. She is not inclined towards surgery. - Refer to physical therapy in [place]. - Follow up in 2-3 months to reassess symptoms and treatment efficacy.  I spent a total of 30 minutes in this patient's care today. This time was spent reviewing pertinent records  including imaging studies, obtaining and confirming history, performing a directed evaluation, formulating and discussing my recommendations, and documenting the visit within the medical record.   Thank you for involving me in the care of this patient.      Phillippe Orlick K. Clois MD, [facility]

## 2024-01-26 ENCOUNTER — Encounter: Payer: Self-pay | Admitting: Neurosurgery

## 2024-01-26 ENCOUNTER — Ambulatory Visit (INDEPENDENT_AMBULATORY_CARE_PROVIDER_SITE_OTHER): Admitting: Neurosurgery

## 2024-01-26 VITALS — BP 124/86 | Ht 65.0 in | Wt 114.4 lb

## 2024-01-26 DIAGNOSIS — M5441 Lumbago with sciatica, right side: Secondary | ICD-10-CM

## 2024-01-26 DIAGNOSIS — G8929 Other chronic pain: Secondary | ICD-10-CM

## 2024-01-26 DIAGNOSIS — M4316 Spondylolisthesis, lumbar region: Secondary | ICD-10-CM | POA: Diagnosis not present

## 2024-01-31 DIAGNOSIS — K080 Exfoliation of teeth due to systemic causes: Secondary | ICD-10-CM | POA: Diagnosis not present

## 2024-02-01 ENCOUNTER — Encounter: Payer: Self-pay | Admitting: Cardiology

## 2024-02-01 ENCOUNTER — Other Ambulatory Visit (HOSPITAL_BASED_OUTPATIENT_CLINIC_OR_DEPARTMENT_OTHER): Payer: Self-pay

## 2024-02-01 ENCOUNTER — Ambulatory Visit: Attending: Cardiology | Admitting: Cardiology

## 2024-02-01 VITALS — BP 112/80 | HR 55 | Ht 65.0 in | Wt 115.2 lb

## 2024-02-01 DIAGNOSIS — I10 Essential (primary) hypertension: Secondary | ICD-10-CM | POA: Diagnosis not present

## 2024-02-01 DIAGNOSIS — N1832 Chronic kidney disease, stage 3b: Secondary | ICD-10-CM

## 2024-02-01 DIAGNOSIS — I77819 Aortic ectasia, unspecified site: Secondary | ICD-10-CM | POA: Diagnosis not present

## 2024-02-01 MED ORDER — NEBIVOLOL HCL 2.5 MG PO TABS
2.5000 mg | ORAL_TABLET | Freq: Two times a day (BID) | ORAL | 2 refills | Status: DC
  Filled 2024-02-01: qty 180, 90d supply, fill #0

## 2024-02-01 NOTE — Progress Notes (Signed)
    Cardiology Office Note  Date: 02/01/2024   ID: Allison Morris, DOB 15-Oct-1935, MRN [id number]  History of Present Illness: Allison Morris is an 88 y.o. female last seen in February.  She is here for a follow-up visit.  Reports no chest pain or palpitations, no falls.  She uses a cane to ambulate.  We discussed her medications.  She remains on Bystolic  2.5 mg twice daily.  Continues to track blood pressure at home.  She is now planning to follow with [name] for primary care.  I reviewed her last lab work which is noted below.  I reviewed her ECG today which shows sinus bradycardia.  Physical Exam: VS:  BP 112/80 (BP Location: Left Arm)   Pulse (!) 55   Ht 5' 5 (1.651 m)   Wt 115 lb 3.2 oz (52.3 kg)   SpO2 98%   BMI 19.17 kg/m , BMI Body mass index is 19.17 kg/m.  Wt Readings from Last 3 Encounters:  02/01/24 115 lb 3.2 oz (52.3 kg)  01/26/24 114 lb 6 oz (51.9 kg)  07/27/23 113 lb 12.8 oz (51.6 kg)    General: Patient appears comfortable at rest. HEENT: Conjunctiva and lids normal. Neck: Supple, no elevated JVP or carotid bruits. Lungs: Clear to auscultation, nonlabored breathing at rest. Cardiac: Regular rate and rhythm, no S3, 1/6 systolic murmur. Extremities: No pitting edema.  ECG:  An ECG dated 01/18/2023 was personally reviewed today and demonstrated:  Sinus bradycardia.  Labwork:  February 2025: Cholesterol 153, triglycerides 99, HDL 49, LDL 86 May 2025: Hemoglobin 13, platelets 167, BUN 34, creatinine 1.74, GFR 28, potassium 4.7, magnesium 2.2  Other Studies Reviewed Today:  No interval cardiac testing for review today.  Assessment and Plan:  1.  Primary hypertension.  Difficult to control in the setting of multiple medication intolerances/allergies, no secondary causes based on prior workup in the hypertension clinic (no clear evidence of pheochromocytoma, hyperaldosteronism, or renal artery stenosis).  She has done reasonably well on Bystolic  2.5 mg  twice daily, takes an additional tablet for blood pressure spikes as needed.  Continue with current plan and establish primary care with [name] as planned.   2.  CKD stage IIIb.  Creatinine 1.74 with GFR 28.   3.  Ascending aortic dilatation and aortic regurgitation.  Echocardiogram in July 2023 revealed ascending aorta 43 mm with mild aortic regurgitation.  She is asymptomatic.  Disposition:  Follow up 1 year.  Signed, Jayson JUDITHANN Sierras, M.D., F.A.C.C. [facility]

## 2024-02-01 NOTE — Patient Instructions (Addendum)

## 2024-02-08 DIAGNOSIS — I10 Essential (primary) hypertension: Secondary | ICD-10-CM | POA: Diagnosis not present

## 2024-02-08 DIAGNOSIS — E782 Mixed hyperlipidemia: Secondary | ICD-10-CM | POA: Diagnosis not present

## 2024-02-08 DIAGNOSIS — Z681 Body mass index (BMI) 19 or less, adult: Secondary | ICD-10-CM | POA: Diagnosis not present

## 2024-02-08 DIAGNOSIS — Z8639 Personal history of other endocrine, nutritional and metabolic disease: Secondary | ICD-10-CM | POA: Diagnosis not present

## 2024-02-09 DIAGNOSIS — M5459 Other low back pain: Secondary | ICD-10-CM | POA: Diagnosis not present

## 2024-02-09 DIAGNOSIS — M48061 Spinal stenosis, lumbar region without neurogenic claudication: Secondary | ICD-10-CM | POA: Diagnosis not present

## 2024-02-14 DIAGNOSIS — E782 Mixed hyperlipidemia: Secondary | ICD-10-CM | POA: Diagnosis not present

## 2024-02-14 DIAGNOSIS — N184 Chronic kidney disease, stage 4 (severe): Secondary | ICD-10-CM | POA: Diagnosis not present

## 2024-02-14 DIAGNOSIS — Z8639 Personal history of other endocrine, nutritional and metabolic disease: Secondary | ICD-10-CM | POA: Diagnosis not present

## 2024-02-14 DIAGNOSIS — M79601 Pain in right arm: Secondary | ICD-10-CM | POA: Diagnosis not present

## 2024-02-14 DIAGNOSIS — I10 Essential (primary) hypertension: Secondary | ICD-10-CM | POA: Diagnosis not present

## 2024-02-14 DIAGNOSIS — I129 Hypertensive chronic kidney disease with stage 1 through stage 4 chronic kidney disease, or unspecified chronic kidney disease: Secondary | ICD-10-CM | POA: Diagnosis not present

## 2024-02-29 ENCOUNTER — Ambulatory Visit: Payer: Self-pay

## 2024-03-06 ENCOUNTER — Ambulatory Visit (HOSPITAL_COMMUNITY): Attending: Neurosurgery

## 2024-03-06 ENCOUNTER — Encounter (HOSPITAL_COMMUNITY): Payer: Self-pay

## 2024-03-06 ENCOUNTER — Other Ambulatory Visit: Payer: Self-pay

## 2024-03-06 DIAGNOSIS — G8929 Other chronic pain: Secondary | ICD-10-CM | POA: Insufficient documentation

## 2024-03-06 DIAGNOSIS — R201 Hypoesthesia of skin: Secondary | ICD-10-CM | POA: Insufficient documentation

## 2024-03-06 DIAGNOSIS — M4316 Spondylolisthesis, lumbar region: Secondary | ICD-10-CM | POA: Insufficient documentation

## 2024-03-06 DIAGNOSIS — M5459 Other low back pain: Secondary | ICD-10-CM | POA: Insufficient documentation

## 2024-03-06 DIAGNOSIS — M5441 Lumbago with sciatica, right side: Secondary | ICD-10-CM | POA: Insufficient documentation

## 2024-03-06 DIAGNOSIS — Z7409 Other reduced mobility: Secondary | ICD-10-CM | POA: Insufficient documentation

## 2024-03-06 NOTE — Therapy (Signed)
 OUTPATIENT PHYSICAL THERAPY THORACOLUMBAR EVALUATION   Patient Name: Allison Morris MRN: [id number] DOB:07-Sep-1935, 88 y.o., female Today's Date: 03/06/2024  END OF SESSION:  PT End of Session - 03/06/24 1757     Visit Number 1    Date for Recertification  04/17/24    Authorization Type BCBS MEDICARE    Authorization Time Period seeking auth    Progress Note Due on Visit 10    PT Start Time 1330    PT Stop Time 1410    PT Time Calculation (min) 40 min    Activity Tolerance Patient tolerated treatment well;Patient limited by pain    Behavior During Therapy [facility] for tasks assessed/performed          Past Medical History:  Diagnosis Date   Aortic dilatation    a. echo 08/2017 showed EF 65-70%, grade 1 DD, mild AI, mildly dilated ascending aorta (43mm), mild MR, mild TR.    Aortic regurgitation    Arthritis    CKD (chronic kidney disease), stage III (HCC)    Essential hypertension    GERD (gastroesophageal reflux disease)    History of DVT (deep vein thrombosis)    Left leg 1997    Hyperlipemia    Hypertensive urgency 02/05/2022   Mild aortic insufficiency    Mild mitral regurgitation    Mild tricuspid insufficiency    Seasonal allergies    Past Surgical History:  Procedure Laterality Date   APPENDECTOMY     CARPAL TUNNEL RELEASE     IR RADIOLOGIST EVAL & MGMT  01/21/2021   IR SACROPLASTY BILATERAL  12/18/2020   MASS EXCISION Right 08/07/2019   Procedure: EXCISION CYST RIGHT INDEX AND RIGHT MIDDLE FINGERS WITH DEBRIDEMENT OF METACARPALPHALANGEAL JOINTS;  Surgeon: Murrell Kuba, MD;  Location: [facility];  Service: Orthopedics;  Laterality: Right;   TONSILLECTOMY     Patient Active Problem List   Diagnosis Date Noted   Hypertensive urgency 02/05/2022   Pneumonia of both lungs due to infectious organism 07/20/2021   Acute respiratory failure with hypoxia (HCC) 07/20/2021   Stage 3b chronic kidney disease (CKD) (HCC) 07/20/2021   Mass of finger of right  hand 05/30/2019   Osteoarthritis of both hands 05/30/2019   Orthostatic lightheadedness 12/11/2017   TIA (transient ischemic attack) 11/14/2015   Hypertension    Hyperlipemia    History of DVT (deep vein thrombosis)    Aortic dilatation     PCP: Shona Norleen PEDLAR, MD   REFERRING PROVIDER: Clois Fret, MD  REFERRING DIAG: M43.16 (ICD-10-CM) - Spondylolisthesis of lumbar region G89.29,M54.41 (ICD-10-CM) - Chronic bilateral low back pain with right-sided sciatica  Rationale for Evaluation and Treatment: Rehabilitation  THERAPY DIAG:  Other low back pain  Impaired functional mobility, balance, gait, and endurance  Impaired sensation  ONSET DATE: over 10 years ago, gotten worse in the last 4 years  SUBJECTIVE:  SUBJECTIVE STATEMENT: Pt states back pain has been going on for years, no MOI. Pt states she worked for [employer], in [place]. Pt states the pain has gotten worse in the last four years. Pt states they put cement in her back about 6 years ago, fixed the left side but not the right. Pt states she has been using the cane for about 2 years, to help with endurance. Pt states right foot is numb about half of the time which bothers her balance.  PERTINENT HISTORY:  Stage IV kidney disease Blood pressure fluctuations Gel injections on both sides  PAIN:  Are you having pain? Yes: NPRS scale: 5/10 Pain location: Mid to right low back all the way down the leg to the toes Pain description: sharp pains Aggravating factors: standing, vacuuming/sweeping Relieving factors: blue emu rub, move around  PRECAUTIONS: None  RED FLAGS: None   WEIGHT BEARING RESTRICTIONS: No  FALLS:  Has patient fallen in last 6 months? No  LIVING ENVIRONMENT: Lives with: lives alone Lives in:  House/apartment Stairs: No Has following equipment at home: Single point cane and Environmental consultant - 4 wheeled  OCCUPATION: retired  PLOF: Independent and Independent with basic ADLs  PATIENT GOALS: decrease the back pain, increased activity tolerance, pt would like to return to yard work with flowers.  NEXT MD VISIT: around the first of November  OBJECTIVE:  Note: Objective measures were completed at Evaluation unless otherwise noted.  DIAGNOSTIC FINDINGS:  None  PATIENT SURVEYS:  Modified Oswestry:  21 / 50 = 42.0 %  COGNITION: Overall cognitive status: pt on meds for memory     SENSATION: Light touch: Impaired , right foot stays numb about half the time  POSTURE: rounded shoulders, forward head, posterior pelvic tilt, and flexed trunk   PALPATION: TBA  LUMBAR ROM:   AROM eval  Flexion 75  Extension 15, pain  Right lateral flexion 20, slight pain  Left lateral flexion 20 worse pain  Right rotation WFL, slight pain  Left rotation WFL, slight pain   (Blank rows = not tested)  LOWER EXTREMITY ROM:     Active  Right eval Left eval  Hip flexion    Hip extension    Hip abduction    Hip adduction    Hip internal rotation    Hip external rotation    Knee flexion    Knee extension    Ankle dorsiflexion    Ankle plantarflexion    Ankle inversion    Ankle eversion     (Blank rows = not tested)  LOWER EXTREMITY MMT:    MMT Right eval Left eval  Hip flexion 3+ 4-  Hip extension 3 3  Hip abduction 3- 3+  Hip adduction 4 4  Hip internal rotation    Hip external rotation    Knee flexion 3 3+  Knee extension 4- 4-  Ankle dorsiflexion 4- 4-  Ankle plantarflexion    Ankle inversion    Ankle eversion     (Blank rows = not tested)  LUMBAR SPECIAL TESTS:  Straight leg raise test: Negative  FUNCTIONAL TESTS:  5 times sit to stand: 25.07 seconds 2 minute walk test: TBA  GAIT: Distance walked: 80 feet to and from treatment area Assistive device utilized:  Single point cane Level of assistance: Modified independence Comments: pt demonstrates significantly slow gait speed and decreased stride length bilaterally  TREATMENT DATE:  03/06/2024  Evaluation: -ROM measured, Strength assessed, HEP prescribed, pt educated on prognosis, findings, and importance of  HEP compliance if given.   PATIENT EDUCATION:  Education details: Pt was educated on findings of PT evaluation, prognosis, frequency of therapy visits and rationale, attendance policy, and HEP if given.   Person educated: Patient Education method: Explanation, Verbal cues, and Handouts Education comprehension: verbalized understanding, verbal cues required, and needs further education  HOME EXERCISE PROGRAM: Access Code: [email or web address] URL: [email or web address] Date: 03/06/2024 Prepared by: Lang Ada  Exercises - Supine Bridge  - 1 x daily - 7 x weekly - 3 sets - 10 reps - 3 hold - Supine Lower Trunk Rotation  - 1 x daily - 7 x weekly - 3 sets - 10 reps - Sit to Stand with Armchair  - 1 x daily - 7 x weekly - 3 sets - 10 reps  ASSESSMENT:  CLINICAL IMPRESSION: Patient is a 88 y.o. female who was seen today for physical therapy evaluation and treatment for M43.16 (ICD-10-CM) - Spondylolisthesis of lumbar region G89.29,M54.41 (ICD-10-CM) - Chronic bilateral low back pain with right-sided sciatica.   Patient demonstrates increased low back pain, decreased LE/core strength, abnormal gait pattern, impaired functional mobility and balance. Patient also demonstrates difficulty with ambulation during today's session with decreased stride length and velocity noted. Patient also demonstrates increased low back pain with lumbar AROM with the worst symptom reproduction with extension and left lateral flexion. Patient requires education on the role of PT, prognosis, imaging results, and importance of HEP compliance. Patient would benefit from skilled physical therapy for decreased low back  pain, increased endurance with ambulation, increased LE/core strength, and balance for improved gait quality, return to higher level of function with ADLs, and progress towards therapy goals.   OBJECTIVE IMPAIRMENTS: Abnormal gait, decreased balance, decreased endurance, decreased knowledge of use of DME, decreased mobility, difficulty walking, decreased ROM, decreased strength, and pain.   ACTIVITY LIMITATIONS: carrying, lifting, bending, sitting, standing, squatting, stairs, transfers, bed mobility, and bathing  PARTICIPATION LIMITATIONS: meal prep, cleaning, laundry, community activity, and yard work  PERSONAL FACTORS: Age, Fitness, Past/current experiences, and Time since onset of injury/illness/exacerbation are also affecting patient's functional outcome.   REHAB POTENTIAL: Fair chronic in nature  CLINICAL DECISION MAKING: Stable/uncomplicated  EVALUATION COMPLEXITY: Low   GOALS: Goals reviewed with patient? No  SHORT TERM GOALS: Target date: 03/27/24  Pt will be independent with HEP in order to demonstrate participation in Physical Therapy POC.  Baseline: Goal status: INITIAL  2.  Pt will report 3/10 pain with lumbar spine mobility in order to demonstrate improved pain with ADLs.  Baseline:  Goal status: INITIAL  LONG TERM GOALS: Target date: 04/17/24  Pt will improve 5TSTS by at least 5 seconds in order to demonstrate improved functional strength to return to desired activities.  Baseline: see objective.  Goal status: INITIAL  2.  Pt will improve 2 MWT by at least 40 feet in order to demonstrate improved functional ambulatory capacity in community setting.  Baseline: see objective.  Goal status: INITIAL  3.  Pt will improve Modified Oswestry score by at least 6 points in order to demonstrate improved pain with functional goals and outcomes. Baseline: see objective.  Goal status: INITIAL  4.  Pt will report 1/10 pain with mobility in order to demonstrate reduced  pain with ADLs lasting greater than 30 minutes.  Baseline: see objective.  Goal status: INITIAL   PLAN:  PT FREQUENCY: 1-2x/week  PT DURATION: 6 weeks  PLANNED INTERVENTIONS: 97110-Therapeutic exercises, 97530- Therapeutic activity, [id number]- Neuromuscular re-education, 97535- Self Care, 02859- Manual therapy, [id number]-  Gait training, [phone]- Electrical stimulation (unattended), [phone]- Electrical stimulation (manual), Patient/Family education, Balance training, Stair training, Spinal mobilization, DME instructions, Cryotherapy, and Moist heat.  PLAN FOR NEXT SESSION: , assess balance, progress core and LE strengthening, palpate low back, manual techniques if appropriate   Lang Ada, PT, DPT [facility] Office: [phone] 6:11 PM, 03/06/24   Managed Medicaid Authorization Request Treatment Start Date: 18-Mar-2024  Visit Dx Codes: M54.59; Z74.09; R20.1  Functional Tool Score: Modified Oswestry:  21 / 50 = 42.0 %  For all possible CPT codes, reference the Planned Interventions line above.     Check all conditions that are expected to impact treatment: {Conditions expected to impact treatment:Unknown   If treatment provided at initial evaluation, no treatment charged due to lack of authorization.

## 2024-03-08 ENCOUNTER — Encounter (HOSPITAL_COMMUNITY): Payer: Self-pay

## 2024-03-08 ENCOUNTER — Ambulatory Visit (HOSPITAL_COMMUNITY): Attending: Neurosurgery

## 2024-03-08 DIAGNOSIS — M5459 Other low back pain: Secondary | ICD-10-CM | POA: Diagnosis present

## 2024-03-08 DIAGNOSIS — R201 Hypoesthesia of skin: Secondary | ICD-10-CM | POA: Insufficient documentation

## 2024-03-08 DIAGNOSIS — Z7409 Other reduced mobility: Secondary | ICD-10-CM | POA: Diagnosis present

## 2024-03-08 NOTE — Therapy (Signed)
 OUTPATIENT PHYSICAL THERAPY THORACOLUMBAR TREATMENT   Patient Name: Allison Morris MRN: [id number] DOB:04/26/1936, 88 y.o., female Today's Date: 03/08/2024  END OF SESSION:  PT End of Session - 03/08/24 1415     Visit Number 2    Number of Visits 12    Date for Recertification  04/17/24    Authorization Type BCBS MEDICARE    Authorization Time Period no auth required for BCBS Med    Progress Note Due on Visit 10    PT Start Time 1418    PT Stop Time 1500    PT Time Calculation (min) 42 min    Activity Tolerance Patient tolerated treatment well;Patient limited by pain    Behavior During Therapy [facility] for tasks assessed/performed          Past Medical History:  Diagnosis Date   Aortic dilatation    a. echo 08/2017 showed EF 65-70%, grade 1 DD, mild AI, mildly dilated ascending aorta (43mm), mild MR, mild TR.    Aortic regurgitation    Arthritis    CKD (chronic kidney disease), stage III (HCC)    Essential hypertension    GERD (gastroesophageal reflux disease)    History of DVT (deep vein thrombosis)    Left leg 1997    Hyperlipemia    Hypertensive urgency 02/05/2022   Mild aortic insufficiency    Mild mitral regurgitation    Mild tricuspid insufficiency    Seasonal allergies    Past Surgical History:  Procedure Laterality Date   APPENDECTOMY     CARPAL TUNNEL RELEASE     IR RADIOLOGIST EVAL & MGMT  01/21/2021   IR SACROPLASTY BILATERAL  12/18/2020   MASS EXCISION Right 08/07/2019   Procedure: EXCISION CYST RIGHT INDEX AND RIGHT MIDDLE FINGERS WITH DEBRIDEMENT OF METACARPALPHALANGEAL JOINTS;  Surgeon: Murrell Kuba, MD;  Location: [facility];  Service: Orthopedics;  Laterality: Right;   TONSILLECTOMY     Patient Active Problem List   Diagnosis Date Noted   Hypertensive urgency 02/05/2022   Pneumonia of both lungs due to infectious organism 07/20/2021   Acute respiratory failure with hypoxia (HCC) 07/20/2021   Stage 3b chronic kidney disease (CKD) (HCC)  07/20/2021   Mass of finger of right hand 05/30/2019   Osteoarthritis of both hands 05/30/2019   Orthostatic lightheadedness 12/11/2017   TIA (transient ischemic attack) 11/14/2015   Hypertension    Hyperlipemia    History of DVT (deep vein thrombosis)    Aortic dilatation     PCP: Shona Norleen PEDLAR, MD   REFERRING PROVIDER: Clois Fret, MD  REFERRING DIAG: M43.16 (ICD-10-CM) - Spondylolisthesis of lumbar region G89.29,M54.41 (ICD-10-CM) - Chronic bilateral low back pain with right-sided sciatica  Rationale for Evaluation and Treatment: Rehabilitation  THERAPY DIAG:  Other low back pain  Impaired functional mobility, balance, gait, and endurance  Impaired sensation  ONSET DATE: over 10 years ago, gotten worse in the last 4 years  SUBJECTIVE:  SUBJECTIVE STATEMENT: 10/02/2:  Feeling good today, no reports of pain currently.  Does have numbness in Rt foot constantly.    Reports she has been unable to complete the number of reps with current HEP, stated some are painful folloiwng.   Eval:  Pt states back pain has been going on for years, no MOI. Pt states she worked for [employer], in [place]. Pt states the pain has gotten worse in the last four years. Pt states they put cement in her back about 6 years ago, fixed the left side but not the right. Pt states she has been using the cane for about 2 years, to help with endurance. Pt states right foot is numb about half of the time which bothers her balance.  PERTINENT HISTORY:  Stage IV kidney disease Blood pressure fluctuations Gel injections on both sides  PAIN:  Are you having pain? Yes: NPRS scale: 5/10 Pain location: Mid to right low back all the way down the leg to the toes Pain description: sharp pains Aggravating factors: standing,  vacuuming/sweeping Relieving factors: blue emu rub, move around  PRECAUTIONS: None  RED FLAGS: None   WEIGHT BEARING RESTRICTIONS: No  FALLS:  Has patient fallen in last 6 months? No  LIVING ENVIRONMENT: Lives with: lives alone Lives in: House/apartment Stairs: No Has following equipment at home: Single point cane and Environmental consultant - 4 wheeled  OCCUPATION: retired  PLOF: Independent and Independent with basic ADLs  PATIENT GOALS: decrease the back pain, increased activity tolerance, pt would like to return to yard work with flowers.  NEXT MD VISIT: around the first of November  OBJECTIVE:  Note: Objective measures were completed at Evaluation unless otherwise noted.  DIAGNOSTIC FINDINGS:  None  PATIENT SURVEYS:  Modified Oswestry:  21 / 50 = 42.0 %  COGNITION: Overall cognitive status: pt on meds for memory     SENSATION: Light touch: Impaired , right foot stays numb about half the time  POSTURE: rounded shoulders, forward head, posterior pelvic tilt, and flexed trunk   PALPATION: TBA  LUMBAR ROM:   AROM eval  Flexion 75  Extension 15, pain  Right lateral flexion 20, slight pain  Left lateral flexion 20 worse pain  Right rotation WFL, slight pain  Left rotation WFL, slight pain   (Blank rows = not tested)  LOWER EXTREMITY ROM:     Active  Right eval Left eval  Hip flexion    Hip extension    Hip abduction    Hip adduction    Hip internal rotation    Hip external rotation    Knee flexion    Knee extension    Ankle dorsiflexion    Ankle plantarflexion    Ankle inversion    Ankle eversion     (Blank rows = not tested)  LOWER EXTREMITY MMT:    MMT Right eval Left eval  Hip flexion 3+ 4-  Hip extension 3 3  Hip abduction 3- 3+  Hip adduction 4 4  Hip internal rotation    Hip external rotation    Knee flexion 3 3+  Knee extension 4- 4-  Ankle dorsiflexion 4- 4-  Ankle plantarflexion    Ankle inversion    Ankle eversion     (Blank rows =  not tested)  LUMBAR SPECIAL TESTS:  Straight leg raise test: Negative  FUNCTIONAL TESTS:  5 times sit to stand: 25.07 seconds 2 minute walk test: 187 ft with SPC, increased LBP and radicular symtoms Rt LE  03/08/24:  Balance assessment NBOS- able to stand 60 no HHA Tandem stance Rt forward 26.3    Lt forward 5 increased pain LBP and Rt LE SLS unable due to pain  GAIT: Distance walked: 80 feet to and from treatment area Assistive device utilized: Single point cane Level of assistance: Modified independence Comments: pt demonstrates significantly slow gait speed and decreased stride length bilaterally  TREATMENT DATE:  03/08/24: Reviewed goals Educated importance of HEP compliance 134ft with SPC no LOB, increased LBP 4-5/10 achy pain  Balance assessment NBOS- able to stand 60 no HHA Tandem stance Rt forward 26.3    Lt forward 5 increased pain LBP and Rt LE SLS unable due to pain  Supine: SKTC 2x 30 LTR 5x 10 Bridge- increased pain so DC'd exercise Decompression 2-5 5x 5  03/06/2024  Evaluation: -ROM measured, Strength assessed, HEP prescribed, pt educated on prognosis, findings, and importance of HEP compliance if given.   PATIENT EDUCATION:  Education details: Pt was educated on findings of PT evaluation, prognosis, frequency of therapy visits and rationale, attendance policy, and HEP if given.   Person educated: Patient Education method: Explanation, Verbal cues, and Handouts Education comprehension: verbalized understanding, verbal cues required, and needs further education  HOME EXERCISE PROGRAM: Access Code: [email or web address] URL: [email or web address] Date: 03/06/2024 Prepared by: Lang Ada  Exercises -- Supine Lower Trunk Rotation  - 1 x daily - 7 x weekly - 3 sets - 10 reps - Sit to Stand with Armchair  - 1 x daily - 7 x weekly - 3 sets - 10 reps  03/08/24: -Decompression 2-5 5x  ASSESSMENT:  CLINICAL IMPRESSION: 03/08/24:   Reviewed goals, educated importance of HEP compliance for maximal benefits.  Pt able to recall current exercise program though reports increased pain with bridges.  Encouraged pt to stop bridges at home for now, may return to exercise in future in pain free range.  Objective testing including and assessed balance.  Pt with reports of increased Rt side LBP following and increased difficulty with Rt LE weight bearing balance.  Added decompression exercise for postural and posterior chain strengthening, pt required multimodal cueing for mechanics with this exercise, plan to review next session.  Eval:  Patient is a 88 y.o. female who was seen today for physical therapy evaluation and treatment for M43.16 (ICD-10-CM) - Spondylolisthesis of lumbar region G89.29,M54.41 (ICD-10-CM) - Chronic bilateral low back pain with right-sided sciatica.   Patient demonstrates increased low back pain, decreased LE/core strength, abnormal gait pattern, impaired functional mobility and balance. Patient also demonstrates difficulty with ambulation during today's session with decreased stride length and velocity noted. Patient also demonstrates increased low back pain with lumbar AROM with the worst symptom reproduction with extension and left lateral flexion. Patient requires education on the role of PT, prognosis, imaging results, and importance of HEP compliance. Patient would benefit from skilled physical therapy for decreased low back pain, increased endurance with ambulation, increased LE/core strength, and balance for improved gait quality, return to higher level of function with ADLs, and progress towards therapy goals.   OBJECTIVE IMPAIRMENTS: Abnormal gait, decreased balance, decreased endurance, decreased knowledge of use of DME, decreased mobility, difficulty walking, decreased ROM, decreased strength, and pain.   ACTIVITY LIMITATIONS: carrying, lifting, bending, sitting, standing, squatting, stairs,  transfers, bed mobility, and bathing  PARTICIPATION LIMITATIONS: meal prep, cleaning, laundry, community activity, and yard work  PERSONAL FACTORS: Age, Fitness, Past/current experiences, and Time since onset of injury/illness/exacerbation are also affecting patient's  functional outcome.   REHAB POTENTIAL: Fair chronic in nature  CLINICAL DECISION MAKING: Stable/uncomplicated  EVALUATION COMPLEXITY: Low   GOALS: Goals reviewed with patient? No  SHORT TERM GOALS: Target date: 03/27/24  Pt will be independent with HEP in order to demonstrate participation in Physical Therapy POC.  Baseline: Goal status: INITIAL  2.  Pt will report 3/10 pain with lumbar spine mobility in order to demonstrate improved pain with ADLs.  Baseline:  Goal status: INITIAL  LONG TERM GOALS: Target date: 04/17/24  Pt will improve 5TSTS by at least 5 seconds in order to demonstrate improved functional strength to return to desired activities.  Baseline: see objective.  Goal status: INITIAL  2.  Pt will improve 2 MWT by at least 40 feet in order to demonstrate improved functional ambulatory capacity in community setting.  Baseline: see objective.  Goal status: INITIAL  3.  Pt will improve Modified Oswestry score by at least 6 points in order to demonstrate improved pain with functional goals and outcomes. Baseline: see objective.  Goal status: INITIAL  4.  Pt will report 1/10 pain with mobility in order to demonstrate reduced pain with ADLs lasting greater than 30 minutes.  Baseline: see objective.  Goal status: INITIAL   PLAN:  PT FREQUENCY: 1-2x/week  PT DURATION: 6 weeks  PLANNED INTERVENTIONS: 97110-Therapeutic exercises, 97530- Therapeutic activity, [id number]- Neuromuscular re-education, 97535- Self Care, 02859- Manual therapy, [phone]- Gait training, [phone]- Electrical stimulation (unattended), [phone]- Electrical stimulation (manual), Patient/Family education, Balance training, Stair training,  Spinal mobilization, DME instructions, Cryotherapy, and Moist heat.  PLAN FOR NEXT SESSION: Progress core and LE strengthening, palpate low back, manual techniques if appropriate   Augustin Mclean, LPTA/CLT; CBIS [phone]  5:04 PM, 03/08/24

## 2024-03-13 ENCOUNTER — Ambulatory Visit (HOSPITAL_COMMUNITY): Admitting: Physical Therapy

## 2024-03-13 DIAGNOSIS — M5459 Other low back pain: Secondary | ICD-10-CM

## 2024-03-13 DIAGNOSIS — R201 Hypoesthesia of skin: Secondary | ICD-10-CM

## 2024-03-13 DIAGNOSIS — Z7409 Other reduced mobility: Secondary | ICD-10-CM

## 2024-03-13 NOTE — Therapy (Signed)
 OUTPATIENT PHYSICAL THERAPY THORACOLUMBAR TREATMENT   Patient Name: Allison Morris MRN: [id number] DOB:August 17, 1935, 88 y.o., female Today's Date: 03/13/2024  END OF SESSION:  PT End of Session - 03/13/24 1543     Visit Number 3    Number of Visits 12    Date for Recertification  04/17/24    Authorization Type BCBS MEDICARE    Authorization Time Period no auth required for BCBS Med    Progress Note Due on Visit 10    PT Start Time 1505    PT Stop Time 1545    PT Time Calculation (min) 40 min    Activity Tolerance Patient tolerated treatment well;Patient limited by pain    Behavior During Therapy [facility] for tasks assessed/performed           Past Medical History:  Diagnosis Date   Aortic dilatation    a. echo 08/2017 showed EF 65-70%, grade 1 DD, mild AI, mildly dilated ascending aorta (43mm), mild MR, mild TR.    Aortic regurgitation    Arthritis    CKD (chronic kidney disease), stage III (HCC)    Essential hypertension    GERD (gastroesophageal reflux disease)    History of DVT (deep vein thrombosis)    Left leg 1997    Hyperlipemia    Hypertensive urgency 02/05/2022   Mild aortic insufficiency    Mild mitral regurgitation    Mild tricuspid insufficiency    Seasonal allergies    Past Surgical History:  Procedure Laterality Date   APPENDECTOMY     CARPAL TUNNEL RELEASE     IR RADIOLOGIST EVAL & MGMT  01/21/2021   IR SACROPLASTY BILATERAL  12/18/2020   MASS EXCISION Right 08/07/2019   Procedure: EXCISION CYST RIGHT INDEX AND RIGHT MIDDLE FINGERS WITH DEBRIDEMENT OF METACARPALPHALANGEAL JOINTS;  Surgeon: Murrell Kuba, MD;  Location: [facility];  Service: Orthopedics;  Laterality: Right;   TONSILLECTOMY     Patient Active Problem List   Diagnosis Date Noted   Hypertensive urgency 02/05/2022   Pneumonia of both lungs due to infectious organism 07/20/2021   Acute respiratory failure with hypoxia (HCC) 07/20/2021   Stage 3b chronic kidney disease (CKD) (HCC)  07/20/2021   Mass of finger of right hand 05/30/2019   Osteoarthritis of both hands 05/30/2019   Orthostatic lightheadedness 12/11/2017   TIA (transient ischemic attack) 11/14/2015   Hypertension    Hyperlipemia    History of DVT (deep vein thrombosis)    Aortic dilatation     PCP: Shona Norleen PEDLAR, MD   REFERRING PROVIDER: Clois Fret, MD  REFERRING DIAG: M43.16 (ICD-10-CM) - Spondylolisthesis of lumbar region G89.29,M54.41 (ICD-10-CM) - Chronic bilateral low back pain with right-sided sciatica  Rationale for Evaluation and Treatment: Rehabilitation  THERAPY DIAG:  Other low back pain  Impaired functional mobility, balance, gait, and endurance  Impaired sensation  ONSET DATE: over 10 years ago, gotten worse in the last 4 years  SUBJECTIVE:  SUBJECTIVE STATEMENT: 03/13/24:  pt reports she is a little sore today.  States she removed some weeds down by her mailbox.  Reports she used some of her cream on it but still having pain at 4/10 in Rt hip/lower and down her Rt lateral LE.  States she's been doing her HEP and has been helping.  Eval:  Pt states back pain has been going on for years, no MOI. Pt states she worked for [employer], in [place]. Pt states the pain has gotten worse in the last four years. Pt states they put cement in her back about 6 years ago, fixed the left side but not the right. Pt states she has been using the cane for about 2 years, to help with endurance. Pt states right foot is numb about half of the time which bothers her balance.  PERTINENT HISTORY:  Stage IV kidney disease Blood pressure fluctuations Gel injections on both sides  PAIN:  Are you having pain? Yes: NPRS scale: 4/10 Pain location: Mid to right low back all the way down the leg to the toes Pain  description: sharp pains Aggravating factors: standing, vacuuming/sweeping Relieving factors: blue emu rub, move around  PRECAUTIONS: None  RED FLAGS: None   WEIGHT BEARING RESTRICTIONS: No  FALLS:  Has patient fallen in last 6 months? No  LIVING ENVIRONMENT: Lives with: lives alone Lives in: House/apartment Stairs: No Has following equipment at home: Single point cane and Environmental consultant - 4 wheeled  OCCUPATION: retired  PLOF: Independent and Independent with basic ADLs  PATIENT GOALS: decrease the back pain, increased activity tolerance, pt would like to return to yard work with flowers.  NEXT MD VISIT: around the first of November  OBJECTIVE:  Note: Objective measures were completed at Evaluation unless otherwise noted.  DIAGNOSTIC FINDINGS:  None  PATIENT SURVEYS:  Modified Oswestry:  21 / 50 = 42.0 %  COGNITION: Overall cognitive status: pt on meds for memory     SENSATION: Light touch: Impaired , right foot stays numb about half the time  POSTURE: rounded shoulders, forward head, posterior pelvic tilt, and flexed trunk   PALPATION: TBA  LUMBAR ROM:   AROM eval  Flexion 75  Extension 15, pain  Right lateral flexion 20, slight pain  Left lateral flexion 20 worse pain  Right rotation WFL, slight pain  Left rotation WFL, slight pain   (Blank rows = not tested)  LOWER EXTREMITY ROM:     Active  Right eval Left eval  Hip flexion    Hip extension    Hip abduction    Hip adduction    Hip internal rotation    Hip external rotation    Knee flexion    Knee extension    Ankle dorsiflexion    Ankle plantarflexion    Ankle inversion    Ankle eversion     (Blank rows = not tested)  LOWER EXTREMITY MMT:    MMT Right eval Left eval  Hip flexion 3+ 4-  Hip extension 3 3  Hip abduction 3- 3+  Hip adduction 4 4  Hip internal rotation    Hip external rotation    Knee flexion 3 3+  Knee extension 4- 4-  Ankle dorsiflexion 4- 4-  Ankle plantarflexion     Ankle inversion    Ankle eversion     (Blank rows = not tested)  LUMBAR SPECIAL TESTS:  Straight leg raise test: Negative  FUNCTIONAL TESTS:  5 times sit to stand: 25.07 seconds 2  minute walk test: 187 ft with SPC, increased LBP and radicular symtoms Rt LE   03/08/24:  Balance assessment NBOS- able to stand 60 no HHA Tandem stance Rt forward 26.3    Lt forward 5 increased pain LBP and Rt LE SLS unable due to pain  GAIT: Distance walked: 80 feet to and from treatment area Assistive device utilized: Single point cane Level of assistance: Modified independence Comments: pt demonstrates significantly slow gait speed and decreased stride length bilaterally  TREATMENT DATE:  03/13/24 Supine on moist heat for lumbar  Decompression 2-5, 5X3  Piriformis stretch Rt 2X20  Cross over hip flexor stretch (figure 4) Rt 2X20  SKTC 3X20 each  LTR 10X5  Heelslides 10X each Seated:  sit to stands 5X no UE Nustep at EOS UE/LE 5 minutes level 3 seat   03/08/24: Reviewed goals Educated importance of HEP compliance 171ft with SPC no LOB, increased LBP 4-5/10 achy pain  Balance assessment NBOS- able to stand 60 no HHA Tandem stance Rt forward 26.3    Lt forward 5 increased pain LBP and Rt LE SLS unable due to pain  Supine: SKTC 2x 30 LTR 5x 10 Bridge- increased pain so DC'd exercise Decompression 2-5 5x 5  03/06/2024  Evaluation: -ROM measured, Strength assessed, HEP prescribed, pt educated on prognosis, findings, and importance of HEP compliance if given.   PATIENT EDUCATION:  Education details: Pt was educated on findings of PT evaluation, prognosis, frequency of therapy visits and rationale, attendance policy, and HEP if given.   Person educated: Patient Education method: Explanation, Verbal cues, and Handouts Education comprehension: verbalized understanding, verbal cues required, and needs further education  HOME EXERCISE PROGRAM: Access Code:  [email or web address] URL: [email or web address] Date: 03/06/2024 Prepared by: Lang Ada  Exercises -- Supine Lower Trunk Rotation  - 1 x daily - 7 x weekly - 3 sets - 10 reps - Sit to Stand with Armchair  - 1 x daily - 7 x weekly - 3 sets - 10 reps  03/08/24: -Decompression 2-5 5x   Access Code: [email or web address] URL: [email or web address] Date: 03/13/2024 - Hooklying Single Knee to Chest Stretch  - 1 x daily - 7 x weekly - 3 reps - 20 sec hold - Supine Piriformis Stretch with Foot on Ground  - 1 x daily - 7 x weekly - 2 reps - 20 sec hold - Supine Figure 4 Piriformis Stretch  - 1 x daily - 7 x weekly - 2 reps - 20 sec hold - Supine Heel Slide  - 1 x daily - 7 x weekly - 10 reps    ASSESSMENT:  CLINICAL IMPRESSION: 03/13/24:  Pt reported increased pain today and discussed ways she tries to decrease pain at home.  PT reports she use Blue emu cream and also tries to take a warm bath.  Added moist heat to lower back while completing supine exercises today.  PT still unable to recall decompression exercises without cues and instruction.  Added piriformis stretch in supine both ways as pt was tight (pulling across and pushing down) and added these to HEP.  Also added heelslides and updated HEP to include those exercises not yet added.  Pt with c/o Rt great toe numbness after completing 6 heelslides on Rt and discontinued activity.  PT reported overall reduction in pain at EOS with stretches and addition of moist heat.    Eval:  Patient is a 88 y.o. female who was seen today for physical therapy evaluation and treatment for M43.16 (ICD-10-CM) -  Spondylolisthesis of lumbar region G89.29,M54.41 (ICD-10-CM) - Chronic bilateral low back pain with right-sided sciatica.  Patient demonstrates increased low back pain, decreased LE/core strength, abnormal gait pattern, impaired functional mobility and balance. Patient also demonstrates difficulty with ambulation during today's session with  decreased stride length and velocity noted. Patient also demonstrates increased low back pain with lumbar AROM with the worst symptom reproduction with extension and left lateral flexion. Patient requires education on the role of PT, prognosis, imaging results, and importance of HEP compliance. Patient would benefit from skilled physical therapy for decreased low back pain, increased endurance with ambulation, increased LE/core strength, and balance for improved gait quality, return to higher level of function with ADLs, and progress towards therapy goals.   OBJECTIVE IMPAIRMENTS: Abnormal gait, decreased balance, decreased endurance, decreased knowledge of use of DME, decreased mobility, difficulty walking, decreased ROM, decreased strength, and pain.   ACTIVITY LIMITATIONS: carrying, lifting, bending, sitting, standing, squatting, stairs, transfers, bed mobility, and bathing  PARTICIPATION LIMITATIONS: meal prep, cleaning, laundry, community activity, and yard work  PERSONAL FACTORS: Age, Fitness, Past/current experiences, and Time since onset of injury/illness/exacerbation are also affecting patient's functional outcome.   REHAB POTENTIAL: Fair chronic in nature  CLINICAL DECISION MAKING: Stable/uncomplicated  EVALUATION COMPLEXITY: Low   GOALS: Goals reviewed with patient? No  SHORT TERM GOALS: Target date: 03/27/24  Pt will be independent with HEP in order to demonstrate participation in Physical Therapy POC.  Baseline: Goal status: INITIAL  2.  Pt will report 3/10 pain with lumbar spine mobility in order to demonstrate improved pain with ADLs.  Baseline:  Goal status: INITIAL  LONG TERM GOALS: Target date: 04/17/24  Pt will improve 5TSTS by at least 5 seconds in order to demonstrate improved functional strength to return to desired activities.  Baseline: see objective.  Goal status: INITIAL  2.  Pt will improve 2 MWT by at least 40 feet in order to demonstrate improved  functional ambulatory capacity in community setting.  Baseline: see objective.  Goal status: INITIAL  3.  Pt will improve Modified Oswestry score by at least 6 points in order to demonstrate improved pain with functional goals and outcomes. Baseline: see objective.  Goal status: INITIAL  4.  Pt will report 1/10 pain with mobility in order to demonstrate reduced pain with ADLs lasting greater than 30 minutes.  Baseline: see objective.  Goal status: INITIAL   PLAN:  PT FREQUENCY: 1-2x/week  PT DURATION: 6 weeks  PLANNED INTERVENTIONS: 97110-Therapeutic exercises, 97530- Therapeutic activity, [id number]- Neuromuscular re-education, 97535- Self Care, [id number]- Manual therapy, [phone]- Gait training, [phone]- Electrical stimulation (unattended), [phone]- Electrical stimulation (manual), Patient/Family education, Balance training, Stair training, Spinal mobilization, DME instructions, Cryotherapy, and Moist heat.  PLAN FOR NEXT SESSION: Progress core and LE strengthening, palpate low back, manual techniques if appropriate   Greig KATHEE Fuse, PTA/CLT [facility] Ph: [phone]  3:44 PM, 03/13/24

## 2024-03-15 ENCOUNTER — Ambulatory Visit (HOSPITAL_COMMUNITY)

## 2024-03-15 ENCOUNTER — Encounter (HOSPITAL_COMMUNITY): Payer: Self-pay

## 2024-03-15 DIAGNOSIS — M5459 Other low back pain: Secondary | ICD-10-CM | POA: Diagnosis not present

## 2024-03-15 DIAGNOSIS — Z7409 Other reduced mobility: Secondary | ICD-10-CM

## 2024-03-15 DIAGNOSIS — R201 Hypoesthesia of skin: Secondary | ICD-10-CM

## 2024-03-15 NOTE — Therapy (Signed)
 OUTPATIENT PHYSICAL THERAPY THORACOLUMBAR TREATMENT   Patient Name: Allison Morris MRN: [id number] DOB:Jul 01, 1935, 88 y.o., female Today's Date: 03/15/2024  END OF SESSION:  PT End of Session - 03/15/24 1247     Visit Number 4    Number of Visits 12    Date for Recertification  04/17/24    Authorization Type BCBS MEDICARE    Authorization Time Period no auth required for BCBS Med    Progress Note Due on Visit 10    PT Start Time 1247    PT Stop Time 1328    PT Time Calculation (min) 41 min    Activity Tolerance Patient tolerated treatment well;Patient limited by pain    Behavior During Therapy [facility] for tasks assessed/performed            Past Medical History:  Diagnosis Date   Aortic dilatation    a. echo 08/2017 showed EF 65-70%, grade 1 DD, mild AI, mildly dilated ascending aorta (43mm), mild MR, mild TR.    Aortic regurgitation    Arthritis    CKD (chronic kidney disease), stage III (HCC)    Essential hypertension    GERD (gastroesophageal reflux disease)    History of DVT (deep vein thrombosis)    Left leg 1997    Hyperlipemia    Hypertensive urgency 02/05/2022   Mild aortic insufficiency    Mild mitral regurgitation    Mild tricuspid insufficiency    Seasonal allergies    Past Surgical History:  Procedure Laterality Date   APPENDECTOMY     CARPAL TUNNEL RELEASE     IR RADIOLOGIST EVAL & MGMT  01/21/2021   IR SACROPLASTY BILATERAL  12/18/2020   MASS EXCISION Right 08/07/2019   Procedure: EXCISION CYST RIGHT INDEX AND RIGHT MIDDLE FINGERS WITH DEBRIDEMENT OF METACARPALPHALANGEAL JOINTS;  Surgeon: Murrell Kuba, MD;  Location: [facility];  Service: Orthopedics;  Laterality: Right;   TONSILLECTOMY     Patient Active Problem List   Diagnosis Date Noted   Hypertensive urgency 02/05/2022   Pneumonia of both lungs due to infectious organism 07/20/2021   Acute respiratory failure with hypoxia (HCC) 07/20/2021   Stage 3b chronic kidney disease (CKD)  (HCC) 07/20/2021   Mass of finger of right hand 05/30/2019   Osteoarthritis of both hands 05/30/2019   Orthostatic lightheadedness 12/11/2017   TIA (transient ischemic attack) 11/14/2015   Hypertension    Hyperlipemia    History of DVT (deep vein thrombosis)    Aortic dilatation     PCP: Shona Norleen PEDLAR, MD   REFERRING PROVIDER: Clois Fret, MD  REFERRING DIAG: M43.16 (ICD-10-CM) - Spondylolisthesis of lumbar region G89.29,M54.41 (ICD-10-CM) - Chronic bilateral low back pain with right-sided sciatica  Rationale for Evaluation and Treatment: Rehabilitation  THERAPY DIAG:  Other low back pain  Impaired functional mobility, balance, gait, and endurance  Impaired sensation  ONSET DATE: over 10 years ago, gotten worse in the last 4 years  SUBJECTIVE:  SUBJECTIVE STATEMENT: Pt states she is still having some low back pain, 4/10 pain. Pt states she thinks the exercises help the more she does them.  Eval:  Pt states back pain has been going on for years, no MOI. Pt states she worked for [employer], in [place]. Pt states the pain has gotten worse in the last four years. Pt states they put cement in her back about 6 years ago, fixed the left side but not the right. Pt states she has been using the cane for about 2 years, to help with endurance. Pt states right foot is numb about half of the time which bothers her balance.  PERTINENT HISTORY:  Stage IV kidney disease Blood pressure fluctuations Gel injections on both sides  PAIN:  Are you having pain? Yes: NPRS scale: 4/10 Pain location: Mid to right low back all the way down the leg to the toes Pain description: sharp pains Aggravating factors: standing, vacuuming/sweeping Relieving factors: blue emu rub, move around  PRECAUTIONS: None  RED  FLAGS: None   WEIGHT BEARING RESTRICTIONS: No  FALLS:  Has patient fallen in last 6 months? No  LIVING ENVIRONMENT: Lives with: lives alone Lives in: House/apartment Stairs: No Has following equipment at home: Single point cane and Environmental consultant - 4 wheeled  OCCUPATION: retired  PLOF: Independent and Independent with basic ADLs  PATIENT GOALS: decrease the back pain, increased activity tolerance, pt would like to return to yard work with flowers.  NEXT MD VISIT: around the first of November  OBJECTIVE:  Note: Objective measures were completed at Evaluation unless otherwise noted.  DIAGNOSTIC FINDINGS:  None  PATIENT SURVEYS:  Modified Oswestry:  21 / 50 = 42.0 %  COGNITION: Overall cognitive status: pt on meds for memory     SENSATION: Light touch: Impaired , right foot stays numb about half the time  POSTURE: rounded shoulders, forward head, posterior pelvic tilt, and flexed trunk   PALPATION: TBA  LUMBAR ROM:   AROM eval  Flexion 75  Extension 15, pain  Right lateral flexion 20, slight pain  Left lateral flexion 20 worse pain  Right rotation WFL, slight pain  Left rotation WFL, slight pain   (Blank rows = not tested)  LOWER EXTREMITY ROM:     Active  Right eval Left eval  Hip flexion    Hip extension    Hip abduction    Hip adduction    Hip internal rotation    Hip external rotation    Knee flexion    Knee extension    Ankle dorsiflexion    Ankle plantarflexion    Ankle inversion    Ankle eversion     (Blank rows = not tested)  LOWER EXTREMITY MMT:    MMT Right eval Left eval  Hip flexion 3+ 4-  Hip extension 3 3  Hip abduction 3- 3+  Hip adduction 4 4  Hip internal rotation    Hip external rotation    Knee flexion 3 3+  Knee extension 4- 4-  Ankle dorsiflexion 4- 4-  Ankle plantarflexion    Ankle inversion    Ankle eversion     (Blank rows = not tested)  LUMBAR SPECIAL TESTS:  Straight leg raise test: Negative  FUNCTIONAL  TESTS:  5 times sit to stand: 25.07 seconds 2 minute walk test: 187 ft with SPC, increased LBP and radicular symtoms Rt LE   03/08/24:  Balance assessment NBOS- able to stand 60 no HHA Tandem stance Rt forward 26.3  Lt forward 5 increased pain LBP and Rt LE SLS unable due to pain  GAIT: Distance walked: 80 feet to and from treatment area Assistive device utilized: Single point cane Level of assistance: Modified independence Comments: pt demonstrates significantly slow gait speed and decreased stride length bilaterally  TREATMENT DATE:  03/15/2024  Therapeutic Exercise: -Supine bridges 2 sets of 5 reps, 3 second holds, pt states this is no longer hurting her, pt cued for max hip extension and decreased knee flexion due to cramping -Nustep for 2 min, pt cued for 40 SPM, level 3 resistance, pain in knee reported requests to stop  Neuromuscular Re-education: -Modified crunch, 2 sets of 6 reps, 3 second hold, pt cued for anterior core activation -Modified side plank, 5 attempts per side, pt cued for LE placement and UE assistance -LTR 2 set of 10 reps bilaterally, pt cued to remain in pain free ROM -Bird dogs, LE only, 1 set of 5 reps bilaterally, pt cued for increased LE ROM Therapeutic Activity: -Sit to stands, 1 set of 5 reps, pt cued for core activation -Stair navigation, 2 bouts, 4 stairs, pt cued for reciprocal gait pattern  03/13/24 Supine on moist heat for lumbar  Decompression 2-5, 5X3  Piriformis stretch Rt 2X20  Cross over hip flexor stretch (figure 4) Rt 2X20  SKTC 3X20 each  LTR 10X5  Heelslides 10X each Seated:  sit to stands 5X no UE Nustep at EOS UE/LE 5 minutes level 3 seat   03/08/24: Reviewed goals Educated importance of HEP compliance 165ft with SPC no LOB, increased LBP 4-5/10 achy pain  Balance assessment NBOS- able to stand 60 no HHA Tandem stance Rt forward 26.3    Lt forward 5 increased pain LBP and Rt LE SLS unable due to  pain  Supine: SKTC 2x 30 LTR 5x 10 Bridge- increased pain so DC'd exercise Decompression 2-5 5x 5   PATIENT EDUCATION:  Education details: Pt was educated on findings of PT evaluation, prognosis, frequency of therapy visits and rationale, attendance policy, and HEP if given.   Person educated: Patient Education method: Explanation, Verbal cues, and Handouts Education comprehension: verbalized understanding, verbal cues required, and needs further education  HOME EXERCISE PROGRAM: Access Code: [email or web address] URL: [email or web address] Date: 03/06/2024 Prepared by: Lang Ada  Exercises -- Supine Lower Trunk Rotation  - 1 x daily - 7 x weekly - 3 sets - 10 reps - Sit to Stand with Armchair  - 1 x daily - 7 x weekly - 3 sets - 10 reps  03/08/24: -Decompression 2-5 5x   Access Code: [email or web address] URL: [email or web address] Date: 03/13/2024 - Hooklying Single Knee to Chest Stretch  - 1 x daily - 7 x weekly - 3 reps - 20 sec hold - Supine Piriformis Stretch with Foot on Ground  - 1 x daily - 7 x weekly - 2 reps - 20 sec hold - Supine Figure 4 Piriformis Stretch  - 1 x daily - 7 x weekly - 2 reps - 20 sec hold - Supine Heel Slide  - 1 x daily - 7 x weekly - 10 reps    ASSESSMENT:  CLINICAL IMPRESSION: Patient continues to demonstrate decreased core/LE strength, decreased gait quality and balance. Patient also demonstrates decreased endurance with aerobic based exercise during today's session with increased knee pain on NuStep. Patient able to progress dynamic core and lumbar activation exercises today with bird dog and crunch variation, good performance with verbal cueing. Patient would continue to benefit from skilled physical  therapy for increased endurance with ambulation, increased LE/core strength, and improved balance for improved quality of life, improved independence with gait training and continued progress towards therapy goals.    Eval:  Patient is  a 88 y.o. female who was seen today for physical therapy evaluation and treatment for M43.16 (ICD-10-CM) - Spondylolisthesis of lumbar region G89.29,M54.41 (ICD-10-CM) - Chronic bilateral low back pain with right-sided sciatica.  Patient demonstrates increased low back pain, decreased LE/core strength, abnormal gait pattern, impaired functional mobility and balance. Patient also demonstrates difficulty with ambulation during today's session with decreased stride length and velocity noted. Patient also demonstrates increased low back pain with lumbar AROM with the worst symptom reproduction with extension and left lateral flexion. Patient requires education on the role of PT, prognosis, imaging results, and importance of HEP compliance. Patient would benefit from skilled physical therapy for decreased low back pain, increased endurance with ambulation, increased LE/core strength, and balance for improved gait quality, return to higher level of function with ADLs, and progress towards therapy goals.   OBJECTIVE IMPAIRMENTS: Abnormal gait, decreased balance, decreased endurance, decreased knowledge of use of DME, decreased mobility, difficulty walking, decreased ROM, decreased strength, and pain.   ACTIVITY LIMITATIONS: carrying, lifting, bending, sitting, standing, squatting, stairs, transfers, bed mobility, and bathing  PARTICIPATION LIMITATIONS: meal prep, cleaning, laundry, community activity, and yard work  PERSONAL FACTORS: Age, Fitness, Past/current experiences, and Time since onset of injury/illness/exacerbation are also affecting patient's functional outcome.   REHAB POTENTIAL: Fair chronic in nature  CLINICAL DECISION MAKING: Stable/uncomplicated  EVALUATION COMPLEXITY: Low   GOALS: Goals reviewed with patient? No  SHORT TERM GOALS: Target date: 03/27/24  Pt will be independent with HEP in order to demonstrate participation in Physical Therapy POC.  Baseline: Goal status:  INITIAL  2.  Pt will report 3/10 pain with lumbar spine mobility in order to demonstrate improved pain with ADLs.  Baseline:  Goal status: INITIAL  LONG TERM GOALS: Target date: 04/17/24  Pt will improve 5TSTS by at least 5 seconds in order to demonstrate improved functional strength to return to desired activities.  Baseline: see objective.  Goal status: INITIAL  2.  Pt will improve 2 MWT by at least 40 feet in order to demonstrate improved functional ambulatory capacity in community setting.  Baseline: see objective.  Goal status: INITIAL  3.  Pt will improve Modified Oswestry score by at least 6 points in order to demonstrate improved pain with functional goals and outcomes. Baseline: see objective.  Goal status: INITIAL  4.  Pt will report 1/10 pain with mobility in order to demonstrate reduced pain with ADLs lasting greater than 30 minutes.  Baseline: see objective.  Goal status: INITIAL   PLAN:  PT FREQUENCY: 1-2x/week  PT DURATION: 6 weeks  PLANNED INTERVENTIONS: 97110-Therapeutic exercises, 97530- Therapeutic activity, [id number]- Neuromuscular re-education, 97535- Self Care, [id number]- Manual therapy, [phone]- Gait training, [phone]- Electrical stimulation (unattended), [phone]- Electrical stimulation (manual), Patient/Family education, Balance training, Stair training, Spinal mobilization, DME instructions, Cryotherapy, and Moist heat.  PLAN FOR NEXT SESSION: Progress core and LE strengthening, palpate low back, manual techniques if appropriate   Lang Ada, PT, DPT [facility] Office: [phone] 1:36 PM, 03/15/24

## 2024-03-27 ENCOUNTER — Ambulatory Visit: Admitting: Neurosurgery

## 2024-03-27 VITALS — BP 148/72 | Ht 65.0 in | Wt 118.0 lb

## 2024-03-27 DIAGNOSIS — M5441 Lumbago with sciatica, right side: Secondary | ICD-10-CM | POA: Diagnosis not present

## 2024-03-27 DIAGNOSIS — M4316 Spondylolisthesis, lumbar region: Secondary | ICD-10-CM | POA: Diagnosis not present

## 2024-03-27 DIAGNOSIS — G8929 Other chronic pain: Secondary | ICD-10-CM | POA: Diagnosis not present

## 2024-03-27 NOTE — Progress Notes (Signed)
 Referring Physician:  Bertell Satterfield, MD [address] [place],  [place] [address]  Primary Physician:  Shona Norleen PEDLAR, MD  History of Present Illness: 03/27/2024 She has been doing physical therapy.  She feels that it is helping her.  01/26/2024 [name] is here today with a chief complaint of chronic back pain. She was referred by her son-in-law, Oneil Salt, for evaluation of her back pain.  She has experienced back pain for approximately 25 years, primarily originating in her hip, with variable intensity affecting her ability to walk. She is unable to walk long distances and standing for extended periods is problematic.  She has not engaged in physical therapy but has received injections in the past. Pain pills are ineffective, though Tylenol  provides temporary relief. She has tried various medications for bone density but experienced adverse effects, leading her to discontinue them.  She experiences pain in her buttocks and has had numbness in her foot. No numbness or tingling in her legs since discontinuing the shots. Despite these issues, she remains active, engaging in activities like yard work, although she no longer drives due to her foot numbness.  Her past medical history includes stage three kidney disease, which is a consideration in her treatment options.  Bowel/Bladder Dysfunction: none  Conservative measures:  Physical therapy:  has not participated in Multimodal medical therapy including regular antiinflammatories:  Blue Emu Topical ointment, Tylenol , Tramadol  Injections: 11/03/2023 right L4-L5 nerve block        8/14/205-also at [facility]-waiting on notes  Past Surgery: none  Allison Morris has no symptoms of cervical myelopathy.  The symptoms are causing a significant impact on the patient's life.   I have utilized the care everywhere function in epic to review the outside records available from external health systems.   Review of  Systems:  A 10 point review of systems is negative, except for the pertinent positives and negatives detailed in the HPI.  Past Medical History: Past Medical History:  Diagnosis Date   Aortic dilatation    a. echo 08/2017 showed EF 65-70%, grade 1 DD, mild AI, mildly dilated ascending aorta (43mm), mild MR, mild TR.    Aortic regurgitation    Arthritis    CKD (chronic kidney disease), stage III (HCC)    Essential hypertension    GERD (gastroesophageal reflux disease)    History of DVT (deep vein thrombosis)    Left leg 1997    Hyperlipemia    Hypertensive urgency 02/05/2022   Mild aortic insufficiency    Mild mitral regurgitation    Mild tricuspid insufficiency    Seasonal allergies     Past Surgical History: Past Surgical History:  Procedure Laterality Date   APPENDECTOMY     CARPAL TUNNEL RELEASE     IR RADIOLOGIST EVAL & MGMT  01/21/2021   IR SACROPLASTY BILATERAL  12/18/2020   MASS EXCISION Right 08/07/2019   Procedure: EXCISION CYST RIGHT INDEX AND RIGHT MIDDLE FINGERS WITH DEBRIDEMENT OF METACARPALPHALANGEAL JOINTS;  Surgeon: Murrell Kuba, MD;  Location: [facility];  Service: Orthopedics;  Laterality: Right;   TONSILLECTOMY      Allergies: Allergies as of 03/27/2024 - Review Complete 03/15/2024  Allergen Reaction Noted   Amlodipine  12/21/2017   Biaxin [clarithromycin]  07/31/2014   Doxycycline   07/31/2014   Estrogens  07/31/2014   Felodipine   12/23/2017   Hydralazine  Other (See Comments) 02/05/2022   Labetalol  12/21/2017   Levothyroxine  12/02/2023  Lisinopril Cough 04/24/2019   Losartan  04/24/2019   Macrodantin [nitrofurantoin]  07/31/2014   Statins  07/31/2014   Ceftin [cefuroxime] Palpitations and Other (See Comments)    Chlorthalidone   04/24/2019   Ciprofloxacin Rash    Sulfa antibiotics Itching     Medications:  Current Outpatient Medications:    acetaminophen  (TYLENOL ) 500 MG tablet, Take 500 mg by mouth every 6 (six) hours as needed  for mild pain or moderate pain. , Disp: , Rfl:    aspirin  EC 81 MG EC tablet, Take 1 tablet (81 mg total) by mouth daily., Disp: 100 tablet, Rfl: 1   Biotin 10000 MCG TABS, Take 10,000 mcg by mouth daily., Disp: , Rfl:    Cholecalciferol (VITAMIN D3) 1000 units CAPS, Take 1,000 Units by mouth daily., Disp: , Rfl:    clobetasol cream (TEMOVATE) 0.05 %, Apply 1 application topically daily as needed (for irritation). , Disp: , Rfl:    clopidogrel  (PLAVIX ) 75 MG tablet, Take 1 tablet (75 mg total) by mouth daily., Disp: 30 tablet, Rfl: 2   colchicine  0.6 MG tablet, Take 0.6 mg by mouth daily., Disp: , Rfl:    cromolyn (OPTICROM) 4 % ophthalmic solution, Place 1 drop into both eyes 4 (four) times daily., Disp: , Rfl:    fexofenadine (ALLEGRA) 180 MG tablet, Take 180 mg by mouth daily., Disp: , Rfl:    Flax Oil-Fish Oil-Borage Oil (FISH-FLAX-BORAGE) CAPS, Take 1 capsule by mouth 2 (two) times daily., Disp: , Rfl:    nebivolol  (BYSTOLIC ) 2.5 MG tablet, Take 1 tablet (2.5 mg total) by mouth 2 (two) times daily. OK TO TAKE AN EXTRA TABLET AS NEEDED FOR BLOOD PRESSURE ABOVE 150, Disp: 180 tablet, Rfl: 2   pantoprazole  (PROTONIX ) 40 MG tablet, Take 40 mg by mouth daily. , Disp: , Rfl:    Polyethyl Glycol-Propyl Glycol (SYSTANE OP), Place 1 drop into both eyes daily as needed (dry eyes)., Disp: , Rfl:    trolamine salicylate (ASPERCREME) 10 % cream, Apply 1 application  topically as needed for muscle pain., Disp: , Rfl:    vitamin C (ASCORBIC ACID) 500 MG tablet, Take 500 mg by mouth 2 (two) times daily., Disp: , Rfl:   Social History: Social History   Tobacco Use   Smoking status: Never   Smokeless tobacco: Never  Vaping Use   Vaping status: Never Used  Substance Use Topics   Alcohol use: No    Alcohol/week: 0.0 standard drinks of alcohol   Drug use: No    Family Medical History: Family History  Problem Relation Age of Onset   Aneurysm Sister    Valvular heart disease Sister    Aneurysm  Paternal Grandfather    Heart attack Paternal Grandfather     Physical Examination: Vitals:   03/27/24 1435  BP: (!) 148/72     General: Patient is in no apparent distress. Attention to examination is appropriate.  Neck:   Supple.  Full range of motion.  Respiratory: Patient is breathing without any difficulty.   NEUROLOGICAL:     Awake, alert, oriented to person, place, and time.  Speech is clear and fluent.   Cranial Nerves: Pupils equal round and reactive to light.  Facial tone is symmetric.  Facial sensation is symmetric. Shoulder shrug is symmetric. Tongue protrusion is midline.   Strength:   Side Iliopsoas Quads Hamstring PF DF EHL  R 5 5 5 5 5 5   L 5 5 5 5 5 5    Gait is  abnormal - requires cane.     Medical Decision Making  Imaging: MRI lumbar spine on November 24, 2022 shows advanced multilevel lumbar spondylosis with grade 1-2 anterolisthesis of L4 and L5.  She has severe stenosis at L4-5 and mild to moderate stenosis at L3-4.  I have personally reviewed the images and agree with the above interpretation.  Assessment and Plan: Ms. Edgecombe is a pleasant 88 y.o. female with spondylolisthesis of L4 and L5 causing back pain with primarily right sided sciatica.  She has been having worsening symptoms for the past 25 years.  She will continue physical therapy.  I will refer her for another injection.  I will see her back after she finishes physical therapy.  At that time, we will assess whether she is improved.  If she is not improved, we will consider reimaging versus pain management.  She may not be a candidate for surgical intervention due to her medical issues as well as her osteoporosis.  I spent a total of 10 minutes in this patient's care today. This time was spent reviewing pertinent records including imaging studies, obtaining and confirming history, performing a directed evaluation, formulating and discussing my recommendations, and documenting the visit within  the medical record.   Thank you for involving me in the care of this patient.      Niasha Devins K. Clois MD, [facility]

## 2024-03-28 ENCOUNTER — Encounter (HOSPITAL_COMMUNITY): Payer: Self-pay

## 2024-03-28 ENCOUNTER — Ambulatory Visit (HOSPITAL_COMMUNITY)

## 2024-03-28 DIAGNOSIS — Z7409 Other reduced mobility: Secondary | ICD-10-CM

## 2024-03-28 DIAGNOSIS — M5459 Other low back pain: Secondary | ICD-10-CM | POA: Diagnosis not present

## 2024-03-28 DIAGNOSIS — R201 Hypoesthesia of skin: Secondary | ICD-10-CM

## 2024-03-28 NOTE — Therapy (Signed)
 OUTPATIENT PHYSICAL THERAPY THORACOLUMBAR TREATMENT   Patient Name: Allison Morris MRN: [id number] DOB:September 09, 1935, 88 y.o., female Today's Date: 03/28/2024  END OF SESSION:  PT End of Session - 03/28/24 1345     Visit Number 5    Number of Visits 12    Date for Recertification  04/17/24    Authorization Type BCBS MEDICARE    Authorization Time Period no auth required for BCBS Med    Progress Note Due on Visit 10    PT Start Time 1345    PT Stop Time 1424    PT Time Calculation (min) 39 min    Activity Tolerance Patient tolerated treatment well;Patient limited by pain;No increased pain    Behavior During Therapy [facility] for tasks assessed/performed            Past Medical History:  Diagnosis Date   Aortic dilatation    a. echo 08/2017 showed EF 65-70%, grade 1 DD, mild AI, mildly dilated ascending aorta (43mm), mild MR, mild TR.    Aortic regurgitation    Arthritis    CKD (chronic kidney disease), stage III (HCC)    Essential hypertension    GERD (gastroesophageal reflux disease)    History of DVT (deep vein thrombosis)    Left leg 1997    Hyperlipemia    Hypertensive urgency 02/05/2022   Mild aortic insufficiency    Mild mitral regurgitation    Mild tricuspid insufficiency    Seasonal allergies    Past Surgical History:  Procedure Laterality Date   APPENDECTOMY     CARPAL TUNNEL RELEASE     IR RADIOLOGIST EVAL & MGMT  01/21/2021   IR SACROPLASTY BILATERAL  12/18/2020   MASS EXCISION Right 08/07/2019   Procedure: EXCISION CYST RIGHT INDEX AND RIGHT MIDDLE FINGERS WITH DEBRIDEMENT OF METACARPALPHALANGEAL JOINTS;  Surgeon: Murrell Kuba, MD;  Location: [facility];  Service: Orthopedics;  Laterality: Right;   TONSILLECTOMY     Patient Active Problem List   Diagnosis Date Noted   Hypertensive urgency 02/05/2022   Pneumonia of both lungs due to infectious organism 07/20/2021   Acute respiratory failure with hypoxia (HCC) 07/20/2021   Stage 3b chronic  kidney disease (CKD) (HCC) 07/20/2021   Mass of finger of right hand 05/30/2019   Osteoarthritis of both hands 05/30/2019   Orthostatic lightheadedness 12/11/2017   TIA (transient ischemic attack) 11/14/2015   Hypertension    Hyperlipemia    History of DVT (deep vein thrombosis)    Aortic dilatation     PCP: Shona Norleen PEDLAR, MD   REFERRING PROVIDER: Clois Fret, MD  REFERRING DIAG: M43.16 (ICD-10-CM) - Spondylolisthesis of lumbar region G89.29,M54.41 (ICD-10-CM) - Chronic bilateral low back pain with right-sided sciatica  Rationale for Evaluation and Treatment: Rehabilitation  THERAPY DIAG:  Other low back pain  Impaired functional mobility, balance, gait, and endurance  Impaired sensation  ONSET DATE: over 10 years ago, gotten worse in the last 4 years  SUBJECTIVE:  SUBJECTIVE STATEMENT: LBP scale 4/10, achy pain.  Reports she feels she over did it this weekend, had radicular symptoms down to feet.  Saw MD yesterday and is happy with progress.  Returns in December.  Eval:  Pt states back pain has been going on for years, no MOI. Pt states she worked for [employer], in [place]. Pt states the pain has gotten worse in the last four years. Pt states they put cement in her back about 6 years ago, fixed the left side but not the right. Pt states she has been using the cane for about 2 years, to help with endurance. Pt states right foot is numb about half of the time which bothers her balance.  PERTINENT HISTORY:  Stage IV kidney disease Blood pressure fluctuations Gel injections on both sides  PAIN:  Are you having pain? Yes: NPRS scale: 4/10 Pain location: Mid to right low back all the way down the leg to the toes Pain description: sharp pains Aggravating factors: standing,  vacuuming/sweeping Relieving factors: blue emu rub, move around  PRECAUTIONS: None  RED FLAGS: None   WEIGHT BEARING RESTRICTIONS: No  FALLS:  Has patient fallen in last 6 months? No  LIVING ENVIRONMENT: Lives with: lives alone Lives in: House/apartment Stairs: No Has following equipment at home: Single point cane and Environmental consultant - 4 wheeled  OCCUPATION: retired  PLOF: Independent and Independent with basic ADLs  PATIENT GOALS: decrease the back pain, increased activity tolerance, pt would like to return to yard work with flowers.  NEXT MD VISIT: around the first of November  OBJECTIVE:  Note: Objective measures were completed at Evaluation unless otherwise noted.  DIAGNOSTIC FINDINGS:  None  PATIENT SURVEYS:  Modified Oswestry:  21 / 50 = 42.0 %  COGNITION: Overall cognitive status: pt on meds for memory     SENSATION: Light touch: Impaired , right foot stays numb about half the time  POSTURE: rounded shoulders, forward head, posterior pelvic tilt, and flexed trunk   PALPATION: TBA  LUMBAR ROM:   AROM eval  Flexion 75  Extension 15, pain  Right lateral flexion 20, slight pain  Left lateral flexion 20 worse pain  Right rotation WFL, slight pain  Left rotation WFL, slight pain   (Blank rows = not tested)  LOWER EXTREMITY ROM:     Active  Right eval Left eval  Hip flexion    Hip extension    Hip abduction    Hip adduction    Hip internal rotation    Hip external rotation    Knee flexion    Knee extension    Ankle dorsiflexion    Ankle plantarflexion    Ankle inversion    Ankle eversion     (Blank rows = not tested)  LOWER EXTREMITY MMT:     MMT Right eval Left eval  Hip flexion 3+ 4-  Hip extension 3 3  Hip abduction 3- 3+  Hip adduction 4 4  Hip internal rotation    Hip external rotation    Knee flexion 3 3+  Knee extension 4- 4-  Ankle dorsiflexion 4- 4-  Ankle plantarflexion    Ankle inversion    Ankle eversion     (Blank rows  = not tested)  LUMBAR SPECIAL TESTS:  Straight leg raise test: Negative  FUNCTIONAL TESTS:  5 times sit to stand: 25.07 seconds 2 minute walk test: 187 ft with SPC, increased LBP and radicular symtoms Rt LE   03/08/24:  Balance assessment NBOS-  able to stand 60 no HHA Tandem stance Rt forward 26.3    Lt forward 5 increased pain LBP and Rt LE SLS unable due to pain  GAIT: Distance walked: 80 feet to and from treatment area Assistive device utilized: Single point cane Level of assistance: Modified independence Comments: pt demonstrates significantly slow gait speed and decreased stride length bilaterally  TREATMENT DATE:  03/28/24: UBE L1 2' forward/ 2' backwards Sit to stand, required elevated height, cueing for mechanics 8x no HHA Supine:  Decompression with RTB 5x each direction   Bridges 10x 5 Quadruped: LE extension 10x 5  03/15/2024  Therapeutic Exercise: -Supine bridges 2 sets of 5 reps, 3 second holds, pt states this is no longer hurting her, pt cued for max hip extension and decreased knee flexion due to cramping -Nustep for 2 min, pt cued for 40 SPM, level 3 resistance, pain in knee reported requests to stop  Neuromuscular Re-education: -Modified crunch, 2 sets of 6 reps, 3 second hold, pt cued for anterior core activation -Modified side plank, 5 attempts per side, pt cued for LE placement and UE assistance -LTR 2 set of 10 reps bilaterally, pt cued to remain in pain free ROM -Bird dogs, LE only, 1 set of 5 reps bilaterally, pt cued for increased LE ROM Therapeutic Activity: -Sit to stands, 1 set of 5 reps, pt cued for core activation -Stair navigation, 2 bouts, 4 stairs, pt cued for reciprocal gait pattern  03/13/24 Supine on moist heat for lumbar  Decompression 2-5, 5X3  Piriformis stretch Rt 2X20  Cross over hip flexor stretch (figure 4) Rt 2X20  SKTC 3X20 each  LTR 10X5  Heelslides 10X each Seated:  sit to stands 5X no UE Nustep at EOS UE/LE 5  minutes level 3 seat   03/08/24: Reviewed goals Educated importance of HEP compliance 126ft with SPC no LOB, increased LBP 4-5/10 achy pain  Balance assessment NBOS- able to stand 60 no HHA Tandem stance Rt forward 26.3    Lt forward 5 increased pain LBP and Rt LE SLS unable due to pain  Supine: SKTC 2x 30 LTR 5x 10 Bridge- increased pain so DC'd exercise Decompression 2-5 5x 5   PATIENT EDUCATION:  Education details: Pt was educated on findings of PT evaluation, prognosis, frequency of therapy visits and rationale, attendance policy, and HEP if given.   Person educated: Patient Education method: Explanation, Verbal cues, and Handouts Education comprehension: verbalized understanding, verbal cues required, and needs further education  HOME EXERCISE PROGRAM: Access Code: [email or web address] URL: [email or web address] Date: 03/06/2024 Prepared by: Lang Ada  Exercises -- Supine Lower Trunk Rotation  - 1 x daily - 7 x weekly - 3 sets - 10 reps - Sit to Stand with Armchair  - 1 x daily - 7 x weekly - 3 sets - 10 reps  03/08/24: -Decompression 2-5 5x   Access Code: [email or web address] URL: [email or web address] Date: 03/13/2024 - Hooklying Single Knee to Chest Stretch  - 1 x daily - 7 x weekly - 3 reps - 20 sec hold - Supine Piriformis Stretch with Foot on Ground  - 1 x daily - 7 x weekly - 2 reps - 20 sec hold - Supine Figure 4 Piriformis Stretch  - 1 x daily - 7 x weekly - 2 reps - 20 sec hold - Supine Heel Slide  - 1 x daily - 7 x weekly - 10 reps  03/28/24:  Decompression with RTB    ASSESSMENT:  CLINICAL IMPRESSION: Pt presents  with decreased core/LE strength, forward rolled shoulders and forward head.  Added postural strengthening education and exercises this session with additional UBE and theraband during decompression.  Pt required cueing for mechanics and elevated height with STS today due to gluteal weakness.  No reports of increased  pain through session.    Eval:  Patient is a 88 y.o. female who was seen today for physical therapy evaluation and treatment for M43.16 (ICD-10-CM) - Spondylolisthesis of lumbar region G89.29,M54.41 (ICD-10-CM) - Chronic bilateral low back pain with right-sided sciatica.  Patient demonstrates increased low back pain, decreased LE/core strength, abnormal gait pattern, impaired functional mobility and balance. Patient also demonstrates difficulty with ambulation during today's session with decreased stride length and velocity noted. Patient also demonstrates increased low back pain with lumbar AROM with the worst symptom reproduction with extension and left lateral flexion. Patient requires education on the role of PT, prognosis, imaging results, and importance of HEP compliance. Patient would benefit from skilled physical therapy for decreased low back pain, increased endurance with ambulation, increased LE/core strength, and balance for improved gait quality, return to higher level of function with ADLs, and progress towards therapy goals.   OBJECTIVE IMPAIRMENTS: Abnormal gait, decreased balance, decreased endurance, decreased knowledge of use of DME, decreased mobility, difficulty walking, decreased ROM, decreased strength, and pain.   ACTIVITY LIMITATIONS: carrying, lifting, bending, sitting, standing, squatting, stairs, transfers, bed mobility, and bathing  PARTICIPATION LIMITATIONS: meal prep, cleaning, laundry, community activity, and yard work  PERSONAL FACTORS: Age, Fitness, Past/current experiences, and Time since onset of injury/illness/exacerbation are also affecting patient's functional outcome.   REHAB POTENTIAL: Fair chronic in nature  CLINICAL DECISION MAKING: Stable/uncomplicated  EVALUATION COMPLEXITY: Low   GOALS: Goals reviewed with patient? No  SHORT TERM GOALS: Target date: 03/27/24  Pt will be independent with HEP in order to demonstrate participation in Physical  Therapy POC.  Baseline: Goal status: INITIAL  2.  Pt will report 3/10 pain with lumbar spine mobility in order to demonstrate improved pain with ADLs.  Baseline:  Goal status: INITIAL  LONG TERM GOALS: Target date: 04/17/24  Pt will improve 5TSTS by at least 5 seconds in order to demonstrate improved functional strength to return to desired activities.  Baseline: see objective.  Goal status: INITIAL  2.  Pt will improve 2 MWT by at least 40 feet in order to demonstrate improved functional ambulatory capacity in community setting.  Baseline: see objective.  Goal status: INITIAL  3.  Pt will improve Modified Oswestry score by at least 6 points in order to demonstrate improved pain with functional goals and outcomes. Baseline: see objective.  Goal status: INITIAL  4.  Pt will report 1/10 pain with mobility in order to demonstrate reduced pain with ADLs lasting greater than 30 minutes.  Baseline: see objective.  Goal status: INITIAL   PLAN:  PT FREQUENCY: 1-2x/week  PT DURATION: 6 weeks  PLANNED INTERVENTIONS: 97110-Therapeutic exercises, 97530- Therapeutic activity, [id number]- Neuromuscular re-education, 97535- Self Care, [id number]- Manual therapy, [phone]- Gait training, [phone]- Electrical stimulation (unattended), [phone]- Electrical stimulation (manual), Patient/Family education, Balance training, Stair training, Spinal mobilization, DME instructions, Cryotherapy, and Moist heat.  PLAN FOR NEXT SESSION: Progress core and LE strengthening, palpate low back, manual techniques if appropriate  Augustin Mclean, LPTA/CLT; CBIS [phone]  4:20 PM, 03/28/24

## 2024-03-30 ENCOUNTER — Ambulatory Visit (HOSPITAL_COMMUNITY)

## 2024-03-30 ENCOUNTER — Encounter (HOSPITAL_COMMUNITY): Payer: Self-pay

## 2024-03-30 DIAGNOSIS — M5459 Other low back pain: Secondary | ICD-10-CM | POA: Diagnosis not present

## 2024-03-30 DIAGNOSIS — Z7409 Other reduced mobility: Secondary | ICD-10-CM

## 2024-03-30 DIAGNOSIS — R201 Hypoesthesia of skin: Secondary | ICD-10-CM

## 2024-03-30 NOTE — Therapy (Signed)
 OUTPATIENT PHYSICAL THERAPY THORACOLUMBAR TREATMENT   Patient Name: Allison Morris MRN: [id number] DOB:[birth year], 88 y.o., female Today's Date: 03/30/2024  END OF SESSION:  PT End of Session - 03/30/24 1429     Visit Number 6    Number of Visits 12    Date for Recertification  04/17/24    Authorization Type BCBS MEDICARE    Authorization Time Period no auth required for BC[employer] Med    Progress Note Due on Visit 10    PT Start Time 1429   late sign in   PT Stop Time 1510    PT Time Calculation (min) 41 min    Activity Tolerance Patient tolerated treatment well;Patient limited by pain;No increased pain    Behavior During Therapy [facility] for tasks assessed/performed            Past Medical History:  Diagnosis Date   Aortic dilatation    a. echo 08/2017 showed EF 65-70%, grade 1 DD, mild AI, mildly dilated ascending aorta (43mm), mild MR, mild TR.    Aortic regurgitation    Arthritis    CKD (chronic kidney disease), stage III (HCC)    Essential hypertension    GERD (gastroesophageal reflux disease)    History of DVT (deep vein thrombosis)    Left leg 1997    Hyperlipemia    Hypertensive urgency 02/05/2022   Mild aortic insufficiency    Mild mitral regurgitation    Mild tricuspid insufficiency    Seasonal allergies    Past Surgical History:  Procedure Laterality Date   APPENDECTOMY     CARPAL TUNNEL RELEASE     IR RADIOLOGIST EVAL & MGMT  01/21/2021   IR SACROPLASTY BILATERAL  12/18/2020   MASS EXCISION Right 08/07/2019   Procedure: EXCISION CYST RIGHT INDEX AND RIGHT MIDDLE FINGERS WITH DEBRIDEMENT OF METACARPALPHALANGEAL JOINTS;  Surgeon: Murrell Kuba, MD;  Location: [facility];  Service: Orthopedics;  Laterality: Right;   TONSILLECTOMY     Patient Active Problem List   Diagnosis Date Noted   Hypertensive urgency 02/05/2022   Pneumonia of both lungs due to infectious organism 07/20/2021   Acute respiratory failure with hypoxia (HCC) 07/20/2021   Stage 3b  chronic kidney disease (CKD) (HCC) 07/20/2021   Mass of finger of right hand 05/30/2019   Osteoarthritis of both hands 05/30/2019   Orthostatic lightheadedness 12/11/2017   TIA (transient ischemic attack) 11/14/2015   Hypertension    Hyperlipemia    History of DVT (deep vein thrombosis)    Aortic dilatation     PCP: Shona Norleen PEDLAR, MD   REFERRING PROVIDER: Clois Fret, MD  REFERRING DIAG: M43.16 (ICD-10-CM) - Spondylolisthesis of lumbar region G89.29,M54.41 (ICD-10-CM) - Chronic bilateral low back pain with right-sided sciatica  Rationale for Evaluation and Treatment: Rehabilitation  THERAPY DIAG:  Other low back pain  Impaired functional mobility, balance, gait, and endurance  Impaired sensation  ONSET DATE: over 10 years ago, gotten worse in the last 4 years  SUBJECTIVE:  SUBJECTIVE STATEMENT: Reports of  Rt hip pain with radicular symptoms down to Rt foot.  No reports of pain in back today.  Eval:  Pt states back pain has been going on for years, no MOI. Pt states she worked for [employer], in [place]. Pt states the pain has gotten worse in the last four years. Pt states they put cement in her back about 6 years ago, fixed the left side but not the right. Pt states she has been using the cane for about 2 years, to help with endurance. Pt states right foot is numb about half of the time which bothers her balance.  PERTINENT HISTORY:  Stage IV kidney disease Blood pressure fluctuations Gel injections on both sides  PAIN:  Are you having pain? Yes: NPRS scale: 5/10 Pain location: Rt hip all the way down the leg to the feet Pain description: sharp pains Aggravating factors: standing, vacuuming/sweeping Relieving factors: blue emu rub, move around  PRECAUTIONS: None  RED  FLAGS: None   WEIGHT BEARING RESTRICTIONS: No  FALLS:  Has patient fallen in last 6 months? No  LIVING ENVIRONMENT: Lives with: lives alone Lives in: House/apartment Stairs: No Has following equipment at home: Single point cane and Environmental consultant - 4 wheeled  OCCUPATION: retired  PLOF: Independent and Independent with basic ADLs  PATIENT GOALS: decrease the back pain, increased activity tolerance, pt would like to return to yard work with flowers.  NEXT MD VISIT: around the first of November  OBJECTIVE:  Note: Objective measures were completed at Evaluation unless otherwise noted.  DIAGNOSTIC FINDINGS:  None  PATIENT SURVEYS:  Modified Oswestry:  21 / 50 = 42.0 %  COGNITION: Overall cognitive status: pt on meds for memory     SENSATION: Light touch: Impaired , right foot stays numb about half the time  POSTURE: rounded shoulders, forward head, posterior pelvic tilt, and flexed trunk   PALPATION: Tight hamstringsL Rt LE 115 degrees  LUMBAR ROM:   AROM eval  Flexion 75  Extension 15, pain  Right lateral flexion 20, slight pain  Left lateral flexion 20 worse pain  Right rotation WFL, slight pain  Left rotation WFL, slight pain   (Blank rows = not tested)  LOWER EXTREMITY ROM:     Active  Right eval Left eval  Hip flexion    Hip extension    Hip abduction    Hip adduction    Hip internal rotation    Hip external rotation    Knee flexion    Knee extension    Ankle dorsiflexion    Ankle plantarflexion    Ankle inversion    Ankle eversion     (Blank rows = not tested)  LOWER EXTREMITY MMT:     MMT Right eval Left eval  Hip flexion 3+ 4-  Hip extension 3 3  Hip abduction 3- 3+  Hip adduction 4 4  Hip internal rotation    Hip external rotation    Knee flexion 3 3+  Knee extension 4- 4-  Ankle dorsiflexion 4- 4-  Ankle plantarflexion    Ankle inversion    Ankle eversion     (Blank rows = not tested)  LUMBAR SPECIAL TESTS:  Straight leg raise  test: Negative  FUNCTIONAL TESTS:  5 times sit to stand: 25.07 seconds 2 minute walk test: 187 ft with SPC, increased LBP and radicular symtoms Rt LE   03/08/24:  Balance assessment NBOS- able to stand 60 no HHA Tandem stance Rt forward 26.3  Lt forward 5 increased pain LBP and Rt LE SLS unable due to pain  GAIT: Distance walked: 80 feet to and from treatment area Assistive device utilized: Single point cane Level of assistance: Modified independence Comments: pt demonstrates significantly slow gait speed and decreased stride length bilaterally  TREATMENT DATE:  03/30/24: Supine:  Bridge 10x 5  LTR 5x 10 pain free range  Nerve glide   Hamstring length measurement Rt LE 115 degrees   Hamstring stretch 3x 30 Sidelying:  Clam with RTB 10x 5 Seated: STS 10x elevated height cueing for mechanics  03/28/24: UBE L1 2' forward/ 2' backwards Sit to stand, required elevated height, cueing for mechanics 8x no HHA Supine:  Decompression with RTB 5x each direction   Bridges 10x 5 Quadruped: LE extension 10x 5  03/15/2024  Therapeutic Exercise: -Supine bridges 2 sets of 5 reps, 3 second holds, pt states this is no longer hurting her, pt cued for max hip extension and decreased knee flexion due to cramping -Nustep for 2 min, pt cued for 40 SPM, level 3 resistance, pain in knee reported requests to stop  Neuromuscular Re-education: -Modified crunch, 2 sets of 6 reps, 3 second hold, pt cued for anterior core activation -Modified side plank, 5 attempts per side, pt cued for LE placement and UE assistance -LTR 2 set of 10 reps bilaterally, pt cued to remain in pain free ROM -Bird dogs, LE only, 1 set of 5 reps bilaterally, pt cued for increased LE ROM Therapeutic Activity: -Sit to stands, 1 set of 5 reps, pt cued for core activation -Stair navigation, 2 bouts, 4 stairs, pt cued for reciprocal gait pattern  03/13/24 Supine on moist heat for lumbar  Decompression 2-5,  5X3  Piriformis stretch Rt 2X20  Cross over hip flexor stretch (figure 4) Rt 2X20  SKTC 3X20 each  LTR 10X5  Heelslides 10X each Seated:  sit to stands 5X no UE Nustep at EOS UE/LE 5 minutes level 3 seat   03/08/24: Reviewed goals Educated importance of HEP compliance 114ft with SPC no LOB, increased LBP 4-5/10 achy pain  Balance assessment NBOS- able to stand 60 no HHA Tandem stance Rt forward 26.3    Lt forward 5 increased pain LBP and Rt LE SLS unable due to pain  Supine: SKTC 2x 30 LTR 5x 10 Bridge- increased pain so DC'd exercise Decompression 2-5 5x 5   PATIENT EDUCATION:  Education details: Pt was educated on findings of PT evaluation, prognosis, frequency of therapy visits and rationale, attendance policy, and HEP if given.   Person educated: Patient Education method: Explanation, Verbal cues, and Handouts Education comprehension: verbalized understanding, verbal cues required, and needs further education  HOME EXERCISE PROGRAM: Access Code: [email or web address] URL: [email or web address] Date: 03/06/2024 Prepared by: Lang Ada  Exercises -- Supine Lower Trunk Rotation  - 1 x daily - 7 x weekly - 3 sets - 10 reps - Sit to Stand with Armchair  - 1 x daily - 7 x weekly - 3 sets - 10 reps  03/08/24: -Decompression 2-5 5x   Access Code: [email or web address] URL: [email or web address] Date: 03/13/2024 - Hooklying Single Knee to Chest Stretch  - 1 x daily - 7 x weekly - 3 reps - 20 sec hold - Supine Piriformis Stretch with Foot on Ground  - 1 x daily - 7 x weekly - 2 reps - 20 sec hold - Supine Figure 4 Piriformis Stretch  - 1 x daily - 7 x weekly - 2 reps - 20  sec hold - Supine Heel Slide  - 1 x daily - 7 x weekly - 10 reps  03/28/24:  Decompression with RTB  03/30/24: - Supine Hamstring Stretch  - 2 x daily - 7 x weekly - 1 sets - 3 reps - 30 hold   ASSESSMENT:  CLINICAL IMPRESSION: Added clam for gluteal strengthening and  attempted nerve glides to address radicular symptoms.  PT required cueing for form and proper hold times with stretches.  Noted significant hamstring tightness, added this stretch to POC and HEP with printout given.  No reports of increased or decreased pain through session.    Eval:  Patient is a 88 y.o. female who was seen today for physical therapy evaluation and treatment for M43.16 (ICD-10-CM) - Spondylolisthesis of lumbar region G89.29,M54.41 (ICD-10-CM) - Chronic bilateral low back pain with right-sided sciatica.  Patient demonstrates increased low back pain, decreased LE/core strength, abnormal gait pattern, impaired functional mobility and balance. Patient also demonstrates difficulty with ambulation during today's session with decreased stride length and velocity noted. Patient also demonstrates increased low back pain with lumbar AROM with the worst symptom reproduction with extension and left lateral flexion. Patient requires education on the role of PT, prognosis, imaging results, and importance of HEP compliance. Patient would benefit from skilled physical therapy for decreased low back pain, increased endurance with ambulation, increased LE/core strength, and balance for improved gait quality, return to higher level of function with ADLs, and progress towards therapy goals.   OBJECTIVE IMPAIRMENTS: Abnormal gait, decreased balance, decreased endurance, decreased knowledge of use of DME, decreased mobility, difficulty walking, decreased ROM, decreased strength, and pain.   ACTIVITY LIMITATIONS: carrying, lifting, bending, sitting, standing, squatting, stairs, transfers, bed mobility, and bathing  PARTICIPATION LIMITATIONS: meal prep, cleaning, laundry, community activity, and yard work  PERSONAL FACTORS: Age, Fitness, Past/current experiences, and Time since onset of injury/illness/exacerbation are also affecting patient's functional outcome.   REHAB POTENTIAL: Fair chronic in  nature  CLINICAL DECISION MAKING: Stable/uncomplicated  EVALUATION COMPLEXITY: Low   GOALS: Goals reviewed with patient? No  SHORT TERM GOALS: Target date: 03/27/24  Pt will be independent with HEP in order to demonstrate participation in Physical Therapy POC.  Baseline: Goal status: INITIAL  2.  Pt will report 3/10 pain with lumbar spine mobility in order to demonstrate improved pain with ADLs.  Baseline:  Goal status: INITIAL  LONG TERM GOALS: Target date: 04/17/24  Pt will improve 5TSTS by at least 5 seconds in order to demonstrate improved functional strength to return to desired activities.  Baseline: see objective.  Goal status: INITIAL  2.  Pt will improve 2 MWT by at least 40 feet in order to demonstrate improved functional ambulatory capacity in community setting.  Baseline: see objective.  Goal status: INITIAL  3.  Pt will improve Modified Oswestry score by at least 6 points in order to demonstrate improved pain with functional goals and outcomes. Baseline: see objective.  Goal status: INITIAL  4.  Pt will report 1/10 pain with mobility in order to demonstrate reduced pain with ADLs lasting greater than 30 minutes.  Baseline: see objective.  Goal status: INITIAL   PLAN:  PT FREQUENCY: 1-2x/week  PT DURATION: 6 weeks  PLANNED INTERVENTIONS: 97110-Therapeutic exercises, 97530- Therapeutic activity, [id number]- Neuromuscular re-education, 97535- Self Care, [id number]- Manual therapy, [phone]- Gait training, [phone]- Electrical stimulation (unattended), [phone]- Electrical stimulation (manual), Patient/Family education, Balance training, Stair training, Spinal mobilization, DME instructions, Cryotherapy, and Moist heat.  PLAN FOR NEXT SESSION: Progress core  and LE strengthening, palpate low back, manual techniques if appropriate  Augustin Mclean, LPTA/CLT; CBIS [phone]  3:44 PM, 03/30/24

## 2024-04-02 ENCOUNTER — Encounter (HOSPITAL_COMMUNITY): Payer: Self-pay

## 2024-04-02 ENCOUNTER — Ambulatory Visit (HOSPITAL_COMMUNITY)

## 2024-04-02 ENCOUNTER — Other Ambulatory Visit: Payer: Self-pay | Admitting: Cardiology

## 2024-04-02 DIAGNOSIS — M5459 Other low back pain: Secondary | ICD-10-CM

## 2024-04-02 DIAGNOSIS — Z7409 Other reduced mobility: Secondary | ICD-10-CM

## 2024-04-02 DIAGNOSIS — R201 Hypoesthesia of skin: Secondary | ICD-10-CM

## 2024-04-02 NOTE — Therapy (Signed)
 OUTPATIENT PHYSICAL THERAPY THORACOLUMBAR TREATMENT   Patient Name: ABIGALE DOROW MRN: [id number] DOB:May 25, 1936, 88 y.o., female Today's Date: 04/02/2024  END OF SESSION:  PT End of Session - 04/02/24 1246     Visit Number 7    Number of Visits 12    Date for Recertification  04/17/24    Authorization Type [employer] MEDICARE    Authorization Time Period no auth required for [employer] Med    Progress Note Due on Visit 10    PT Start Time 1245    PT Stop Time 1327    PT Time Calculation (min) 42 min    Activity Tolerance Patient tolerated treatment well;No increased pain    Behavior During Therapy [facility] for tasks assessed/performed             Past Medical History:  Diagnosis Date   Aortic dilatation    a. echo 08/2017 showed EF 65-70%, grade 1 DD, mild AI, mildly dilated ascending aorta (43mm), mild MR, mild TR.    Aortic regurgitation    Arthritis    CKD (chronic kidney disease), stage III (HCC)    Essential hypertension    GERD (gastroesophageal reflux disease)    History of DVT (deep vein thrombosis)    Left leg 1997    Hyperlipemia    Hypertensive urgency 02/05/2022   Mild aortic insufficiency    Mild mitral regurgitation    Mild tricuspid insufficiency    Seasonal allergies    Past Surgical History:  Procedure Laterality Date   APPENDECTOMY     CARPAL TUNNEL RELEASE     IR RADIOLOGIST EVAL & MGMT  01/21/2021   IR SACROPLASTY BILATERAL  12/18/2020   MASS EXCISION Right 08/07/2019   Procedure: EXCISION CYST RIGHT INDEX AND RIGHT MIDDLE FINGERS WITH DEBRIDEMENT OF METACARPALPHALANGEAL JOINTS;  Surgeon: Murrell Kuba, MD;  Location: [facility];  Service: Orthopedics;  Laterality: Right;   TONSILLECTOMY     Patient Active Problem List   Diagnosis Date Noted   Hypertensive urgency 02/05/2022   Pneumonia of both lungs due to infectious organism 07/20/2021   Acute respiratory failure with hypoxia (HCC) 07/20/2021   Stage 3b chronic kidney disease (CKD) (HCC)  07/20/2021   Mass of finger of right hand 05/30/2019   Osteoarthritis of both hands 05/30/2019   Orthostatic lightheadedness 12/11/2017   TIA (transient ischemic attack) 11/14/2015   Hypertension    Hyperlipemia    History of DVT (deep vein thrombosis)    Aortic dilatation     PCP: Shona Norleen PEDLAR, MD   REFERRING PROVIDER: Clois Fret, MD  REFERRING DIAG: M43.16 (ICD-10-CM) - Spondylolisthesis of lumbar region G89.29,M54.41 (ICD-10-CM) - Chronic bilateral low back pain with right-sided sciatica  Rationale for Evaluation and Treatment: Rehabilitation  THERAPY DIAG:  Other low back pain  Impaired functional mobility, balance, gait, and endurance  Impaired sensation  ONSET DATE: over 10 years ago, gotten worse in the last 4 years  SUBJECTIVE:  SUBJECTIVE STATEMENT: Patient reports that she was hurting some since her last appointment, but not today. She thinks that the weather may have something to do with it.   Eval:  Pt states back pain has been going on for years, no MOI. Pt states she worked for [employer], in [place]. Pt states the pain has gotten worse in the last four years. Pt states they put cement in her back about 6 years ago, fixed the left side but not the right. Pt states she has been using the cane for about 2 years, to help with endurance. Pt states right foot is numb about half of the time which bothers her balance.  PERTINENT HISTORY:  Stage IV kidney disease Blood pressure fluctuations Gel injections on both sides  PAIN:  Are you having pain? Yes: NPRS scale: 0/10 Pain location: Rt hip all the way down the leg to the feet Pain description: sharp pains Aggravating factors: standing, vacuuming/sweeping Relieving factors: blue emu rub, move around  PRECAUTIONS: None  RED  FLAGS: None   WEIGHT BEARING RESTRICTIONS: No  FALLS:  Has patient fallen in last 6 months? No  LIVING ENVIRONMENT: Lives with: lives alone Lives in: House/apartment Stairs: No Has following equipment at home: Single point cane and Environmental Consultant - 4 wheeled  OCCUPATION: retired  PLOF: Independent and Independent with basic ADLs  PATIENT GOALS: decrease the back pain, increased activity tolerance, pt would like to return to yard work with flowers.  NEXT MD VISIT: around the first of November  OBJECTIVE:  Note: Objective measures were completed at Evaluation unless otherwise noted.  DIAGNOSTIC FINDINGS:  None  PATIENT SURVEYS:  Modified Oswestry:  21 / 50 = 42.0 %  COGNITION: Overall cognitive status: pt on meds for memory     SENSATION: Light touch: Impaired , right foot stays numb about half the time  POSTURE: rounded shoulders, forward head, posterior pelvic tilt, and flexed trunk   PALPATION: Tight hamstringsL Rt LE 115 degrees  LUMBAR ROM:   AROM eval  Flexion 75  Extension 15, pain  Right lateral flexion 20, slight pain  Left lateral flexion 20 worse pain  Right rotation WFL, slight pain  Left rotation WFL, slight pain   (Blank rows = not tested)  LOWER EXTREMITY ROM:     Active  Right eval Left eval  Hip flexion    Hip extension    Hip abduction    Hip adduction    Hip internal rotation    Hip external rotation    Knee flexion    Knee extension    Ankle dorsiflexion    Ankle plantarflexion    Ankle inversion    Ankle eversion     (Blank rows = not tested)  LOWER EXTREMITY MMT:     MMT Right eval Left eval  Hip flexion 3+ 4-  Hip extension 3 3  Hip abduction 3- 3+  Hip adduction 4 4  Hip internal rotation    Hip external rotation    Knee flexion 3 3+  Knee extension 4- 4-  Ankle dorsiflexion 4- 4-  Ankle plantarflexion    Ankle inversion    Ankle eversion     (Blank rows = not tested)  LUMBAR SPECIAL TESTS:  Straight leg raise  test: Negative  FUNCTIONAL TESTS:  5 times sit to stand: 25.07 seconds 2 minute walk test: 187 ft with SPC, increased LBP and radicular symtoms Rt LE   03/08/24:  Balance assessment NBOS- able to stand 60 no  HHA Tandem stance Rt forward 26.3    Lt forward 5 increased pain LBP and Rt LE SLS unable due to pain  GAIT: Distance walked: 80 feet to and from treatment area Assistive device utilized: Single point cane Level of assistance: Modified independence Comments: pt demonstrates significantly slow gait speed and decreased stride length bilaterally  TREATMENT DATE:                                    04/02/24 EXERCISE LOG  Exercise Repetitions and Resistance Comments  Bridge  15 reps    Supine hip ADD isometric  2 minutes w/ 3 second hold    Lower trunk rotation  2 minutes    SLR   15 reps each    Double knee to chest  2.5 minutes  LE supported on green ball  Sit to stand  10 reps  21.5 table height   Gait training  With SPC; cueing for upright stance    Nustep  L3 x 7 minutes @ seat 10    Blank cell = exercise not performed today   03/30/24: Supine:  Bridge 10x 5  LTR 5x 10 pain free range  Nerve glide   Hamstring length measurement Rt LE 115 degrees   Hamstring stretch 3x 30 Sidelying:  Clam with RTB 10x 5 Seated: STS 10x elevated height cueing for mechanics  03/28/24: UBE L1 2' forward/ 2' backwards Sit to stand, required elevated height, cueing for mechanics 8x no HHA Supine:  Decompression with RTB 5x each direction   Bridges 10x 5 Quadruped: LE extension 10x 5  03/15/2024  Therapeutic Exercise: -Supine bridges 2 sets of 5 reps, 3 second holds, pt states this is no longer hurting her, pt cued for max hip extension and decreased knee flexion due to cramping -Nustep for 2 min, pt cued for 40 SPM, level 3 resistance, pain in knee reported requests to stop  Neuromuscular Re-education: -Modified crunch, 2 sets of 6 reps, 3 second hold, pt cued for anterior  core activation -Modified side plank, 5 attempts per side, pt cued for LE placement and UE assistance -LTR 2 set of 10 reps bilaterally, pt cued to remain in pain free ROM -Bird dogs, LE only, 1 set of 5 reps bilaterally, pt cued for increased LE ROM Therapeutic Activity: -Sit to stands, 1 set of 5 reps, pt cued for core activation -Stair navigation, 2 bouts, 4 stairs, pt cued for reciprocal gait pattern  03/13/24 Supine on moist heat for lumbar  Decompression 2-5, 5X3  Piriformis stretch Rt 2X20  Cross over hip flexor stretch (figure 4) Rt 2X20  SKTC 3X20 each  LTR 10X5  Heelslides 10X each Seated:  sit to stands 5X no UE Nustep at EOS UE/LE 5 minutes level 3 seat  PATIENT EDUCATION:  Education details: Pt was educated on findings of PT evaluation, prognosis, frequency of therapy visits and rationale, attendance policy, and HEP if given.   Person educated: Patient Education method: Explanation, Verbal cues, and Handouts Education comprehension: verbalized understanding, verbal cues required, and needs further education  HOME EXERCISE PROGRAM: Access Code: [email or web address] URL: [email or web address] Date: 03/06/2024 Prepared by: Lang Ada  Exercises -- Supine Lower Trunk Rotation  - 1 x daily - 7 x weekly - 3 sets - 10 reps - Sit to Stand with Armchair  - 1 x daily - 7 x weekly - 3 sets - 10 reps  03/08/24: -  Decompression 2-5 5x   Access Code: [email or web address] URL: [email or web address] Date: 03/13/2024 - Hooklying Single Knee to Chest Stretch  - 1 x daily - 7 x weekly - 3 reps - 20 sec hold - Supine Piriformis Stretch with Foot on Ground  - 1 x daily - 7 x weekly - 2 reps - 20 sec hold - Supine Figure 4 Piriformis Stretch  - 1 x daily - 7 x weekly - 2 reps - 20 sec hold - Supine Heel Slide  - 1 x daily - 7 x weekly - 10 reps  03/28/24:  Decompression with RTB  03/30/24: - Supine Hamstring Stretch  - 2 x daily - 7 x weekly - 1 sets - 3 reps - 30  hold   ASSESSMENT:  CLINICAL IMPRESSION: Patient was progressed with new and familiar interventions to facilitate lumbar stability and mobility. She required minimal cueing with straight leg raises to facilitate hip flexor engagement and lumbar stability. She experienced a mild increase in right foot pain after sit to stands, but this resolved quickly while walking. She experienced no other increase in pain or discomfort with any of today's interventions. She reported feeling alright upon the conclusion of treatment. Patient continues to require skilled physical therapy to address her remaining impairments to return to her prior level of function.    Eval:  Patient is a 88 y.o. female who was seen today for physical therapy evaluation and treatment for M43.16 (ICD-10-CM) - Spondylolisthesis of lumbar region G89.29,M54.41 (ICD-10-CM) - Chronic bilateral low back pain with right-sided sciatica.  Patient demonstrates increased low back pain, decreased LE/core strength, abnormal gait pattern, impaired functional mobility and balance. Patient also demonstrates difficulty with ambulation during today's session with decreased stride length and velocity noted. Patient also demonstrates increased low back pain with lumbar AROM with the worst symptom reproduction with extension and left lateral flexion. Patient requires education on the role of PT, prognosis, imaging results, and importance of HEP compliance. Patient would benefit from skilled physical therapy for decreased low back pain, increased endurance with ambulation, increased LE/core strength, and balance for improved gait quality, return to higher level of function with ADLs, and progress towards therapy goals.   OBJECTIVE IMPAIRMENTS: Abnormal gait, decreased balance, decreased endurance, decreased knowledge of use of DME, decreased mobility, difficulty walking, decreased ROM, decreased strength, and pain.   ACTIVITY LIMITATIONS: carrying, lifting,  bending, sitting, standing, squatting, stairs, transfers, bed mobility, and bathing  PARTICIPATION LIMITATIONS: meal prep, cleaning, laundry, community activity, and yard work  PERSONAL FACTORS: Age, Fitness, Past/current experiences, and Time since onset of injury/illness/exacerbation are also affecting patient's functional outcome.   REHAB POTENTIAL: Fair chronic in nature  CLINICAL DECISION MAKING: Stable/uncomplicated  EVALUATION COMPLEXITY: Low   GOALS: Goals reviewed with patient? No  SHORT TERM GOALS: Target date: 03/27/24  Pt will be independent with HEP in order to demonstrate participation in Physical Therapy POC.  Baseline: Goal status: INITIAL  2.  Pt will report 3/10 pain with lumbar spine mobility in order to demonstrate improved pain with ADLs.  Baseline:  Goal status: INITIAL  LONG TERM GOALS: Target date: 04/17/24  Pt will improve 5TSTS by at least 5 seconds in order to demonstrate improved functional strength to return to desired activities.  Baseline: see objective.  Goal status: INITIAL  2.  Pt will improve 2 MWT by at least 40 feet in order to demonstrate improved functional ambulatory capacity in community setting.  Baseline: see objective.  Goal status: INITIAL  3.  Pt will improve Modified Oswestry score by at least 6 points in order to demonstrate improved pain with functional goals and outcomes. Baseline: see objective.  Goal status: INITIAL  4.  Pt will report 1/10 pain with mobility in order to demonstrate reduced pain with ADLs lasting greater than 30 minutes.  Baseline: see objective.  Goal status: INITIAL   PLAN:  PT FREQUENCY: 1-2x/week  PT DURATION: 6 weeks  PLANNED INTERVENTIONS: 97110-Therapeutic exercises, 97530- Therapeutic activity, [id number]- Neuromuscular re-education, 97535- Self Care, [id number]- Manual therapy, [phone]- Gait training, [phone]- Electrical stimulation (unattended), [phone]- Electrical stimulation (manual), Patient/Family  education, Balance training, Stair training, Spinal mobilization, DME instructions, Cryotherapy, and Moist heat.  PLAN FOR NEXT SESSION: Progress core and LE strengthening, palpate low back, manual techniques if appropriate  Lacinda Fass, PT, DPT  1:30 PM, 04/02/24

## 2024-04-03 ENCOUNTER — Telehealth: Payer: Self-pay | Admitting: Cardiology

## 2024-04-03 ENCOUNTER — Other Ambulatory Visit (HOSPITAL_COMMUNITY): Payer: Self-pay

## 2024-04-03 NOTE — Telephone Encounter (Signed)
 Spoke with patient advised her of the information that was given by our prior auth team. Advised there that a refill has been sent in, but on fill cannot pick up yet. Patient verbalized understanding and has enough medication until then

## 2024-04-03 NOTE — Telephone Encounter (Signed)
 Pharmacy Patient Advocate Encounter   Received notification from Physician's Office that prior authorization for BYSTOLIC  is required/requested.   Insurance verification completed.   The patient is insured through [facility] [employer]IC[employer].   Per test claim: Refill too soon. PA is not needed at this time. Medication was filled 03/23/24. Next eligible fill date is 04/07/24.

## 2024-04-03 NOTE — Telephone Encounter (Signed)
 Pt called in for a prior authorization for BYSTOLIC  2.5 MG tablet stating she can not take the generic please advise

## 2024-04-04 ENCOUNTER — Encounter (HOSPITAL_COMMUNITY): Payer: Self-pay

## 2024-04-04 ENCOUNTER — Ambulatory Visit (HOSPITAL_COMMUNITY)

## 2024-04-04 DIAGNOSIS — M5459 Other low back pain: Secondary | ICD-10-CM | POA: Diagnosis not present

## 2024-04-04 DIAGNOSIS — Z7409 Other reduced mobility: Secondary | ICD-10-CM

## 2024-04-04 DIAGNOSIS — R201 Hypoesthesia of skin: Secondary | ICD-10-CM

## 2024-04-04 NOTE — Therapy (Signed)
 OUTPATIENT PHYSICAL THERAPY THORACOLUMBAR TREATMENT   Patient Name: Allison Morris MRN: [id number] DOB:1936-03-14, 88 y.o., female Today's Date: 04/04/2024  END OF SESSION:  PT End of Session - 04/04/24 1426     Visit Number 8    Number of Visits 12    Date for Recertification  04/17/24    Authorization Type BC[employer] MEDICARE    Authorization Time Period no auth required for [employer] Med    Progress Note Due on Visit 10    PT Start Time 1417    PT Stop Time 1455    PT Time Calculation (min) 38 min    Activity Tolerance Patient tolerated treatment well;No increased pain    Behavior During Therapy [facility] for tasks assessed/performed             Past Medical History:  Diagnosis Date   Aortic dilatation    a. echo 08/2017 showed EF 65-70%, grade 1 DD, mild AI, mildly dilated ascending aorta (43mm), mild MR, mild TR.    Aortic regurgitation    Arthritis    CKD (chronic kidney disease), stage III (HCC)    Essential hypertension    GERD (gastroesophageal reflux disease)    History of DVT (deep vein thrombosis)    Left leg 1997    Hyperlipemia    Hypertensive urgency 02/05/2022   Mild aortic insufficiency    Mild mitral regurgitation    Mild tricuspid insufficiency    Seasonal allergies    Past Surgical History:  Procedure Laterality Date   APPENDECTOMY     CARPAL TUNNEL RELEASE     IR RADIOLOGIST EVAL & MGMT  01/21/2021   IR SACROPLASTY BILATERAL  12/18/2020   MASS EXCISION Right 08/07/2019   Procedure: EXCISION CYST RIGHT INDEX AND RIGHT MIDDLE FINGERS WITH DEBRIDEMENT OF METACARPALPHALANGEAL JOINTS;  Surgeon: Murrell Kuba, MD;  Location: [facility];  Service: Orthopedics;  Laterality: Right;   TONSILLECTOMY     Patient Active Problem List   Diagnosis Date Noted   Hypertensive urgency 02/05/2022   Pneumonia of both lungs due to infectious organism 07/20/2021   Acute respiratory failure with hypoxia (HCC) 07/20/2021   Stage 3b chronic kidney disease (CKD) (HCC)  07/20/2021   Mass of finger of right hand 05/30/2019   Osteoarthritis of both hands 05/30/2019   Orthostatic lightheadedness 12/11/2017   TIA (transient ischemic attack) 11/14/2015   Hypertension    Hyperlipemia    History of DVT (deep vein thrombosis)    Aortic dilatation     PCP: Shona Norleen PEDLAR, MD   REFERRING PROVIDER: Clois Fret, MD  REFERRING DIAG: M43.16 (ICD-10-CM) - Spondylolisthesis of lumbar region G89.29,M54.41 (ICD-10-CM) - Chronic bilateral low back pain with right-sided sciatica  Rationale for Evaluation and Treatment: Rehabilitation  THERAPY DIAG:  Other low back pain  Impaired functional mobility, balance, gait, and endurance  Impaired sensation  ONSET DATE: over 10 years ago, gotten worse in the last 4 years  SUBJECTIVE:  SUBJECTIVE STATEMENT: Back is feeling good today, pain in Rt foot with no tingling down leg today.  Eval:  Pt states back pain has been going on for years, no MOI. Pt states she worked for [employer], in [place]. Pt states the pain has gotten worse in the last four years. Pt states they put cement in her back about 6 years ago, fixed the left side but not the right. Pt states she has been using the cane for about 2 years, to help with endurance. Pt states right foot is numb about half of the time which bothers her balance.  PERTINENT HISTORY:  Stage IV kidney disease Blood pressure fluctuations Gel injections on both sides  PAIN:  Are you having pain? Yes: NPRS scale: 2-3/10 Pain location: Rt foot Pain description: sharp pains Aggravating factors: standing, vacuuming/sweeping Relieving factors: blue emu rub, move around  PRECAUTIONS: None  RED FLAGS: None   WEIGHT BEARING RESTRICTIONS: No  FALLS:  Has patient fallen in last 6 months?  No  LIVING ENVIRONMENT: Lives with: lives alone Lives in: House/apartment Stairs: No Has following equipment at home: Single point cane and Environmental Consultant - 4 wheeled  OCCUPATION: retired  PLOF: Independent and Independent with basic ADLs  PATIENT GOALS: decrease the back pain, increased activity tolerance, pt would like to return to yard work with flowers.  NEXT MD VISIT: around the first of November  OBJECTIVE:  Note: Objective measures were completed at Evaluation unless otherwise noted.  DIAGNOSTIC FINDINGS:  None  PATIENT SURVEYS:  Modified Oswestry:  21 / 50 = 42.0 %  COGNITION: Overall cognitive status: pt on meds for memory     SENSATION: Light touch: Impaired , right foot stays numb about half the time  POSTURE: rounded shoulders, forward head, posterior pelvic tilt, and flexed trunk   PALPATION: Tight hamstringsL Rt LE 115 degrees  LUMBAR ROM:   AROM eval  Flexion 75  Extension 15, pain  Right lateral flexion 20, slight pain  Left lateral flexion 20 worse pain  Right rotation WFL, slight pain  Left rotation WFL, slight pain   (Blank rows = not tested)  LOWER EXTREMITY ROM:     Active  Right eval Left eval  Hip flexion    Hip extension    Hip abduction    Hip adduction    Hip internal rotation    Hip external rotation    Knee flexion    Knee extension    Ankle dorsiflexion    Ankle plantarflexion    Ankle inversion    Ankle eversion     (Blank rows = not tested)  LOWER EXTREMITY MMT:     MMT Right eval Left eval  Hip flexion 3+ 4-  Hip extension 3 3  Hip abduction 3- 3+  Hip adduction 4 4  Hip internal rotation    Hip external rotation    Knee flexion 3 3+  Knee extension 4- 4-  Ankle dorsiflexion 4- 4-  Ankle plantarflexion    Ankle inversion    Ankle eversion     (Blank rows = not tested)  LUMBAR SPECIAL TESTS:  Straight leg raise test: Negative  FUNCTIONAL TESTS:  5 times sit to stand: 25.07 seconds 2 minute walk test: 187  ft with SPC, increased LBP and radicular symtoms Rt LE   03/08/24:  Balance assessment NBOS- able to stand 60 no HHA Tandem stance Rt forward 26.3    Lt forward 5 increased pain LBP and Rt LE SLS unable due  to pain  GAIT: Distance walked: 80 feet to and from treatment area Assistive device utilized: Single point cane Level of assistance: Modified independence Comments: pt demonstrates significantly slow gait speed and decreased stride length bilaterally  TREATMENT DATE:  04/04/24: Nustep L3 resistance x 5' UE/LE Sit to stand with elevated height 10x [place] front of chair cueing for mechanics Forward flexed over counter donkey kicks 10x 3 holds Abduction 10x  Supine: Hamstring stretch 3x30 hands behind knee                                   04/02/24 EXERCISE LOG  Exercise Repetitions and Resistance Comments  Bridge  15 reps    Supine hip ADD isometric  2 minutes w/ 3 second hold    Lower trunk rotation  2 minutes    SLR   15 reps each    Double knee to chest  2.5 minutes  LE supported on green ball  Sit to stand  10 reps  21.5 table height   Gait training  With SPC; cueing for upright stance    Nustep  L3 x 7 minutes @ seat 10    Blank cell = exercise not performed today   03/30/24: Supine:  Bridge 10x 5  LTR 5x 10 pain free range  Nerve glide   Hamstring length measurement Rt LE 115 degrees   Hamstring stretch 3x 30 Sidelying:  Clam with RTB 10x 5 Seated: STS 10x elevated height cueing for mechanics  03/28/24: UBE L1 2' forward/ 2' backwards Sit to stand, required elevated height, cueing for mechanics 8x no HHA Supine:  Decompression with RTB 5x each direction   Bridges 10x 5 Quadruped: LE extension 10x 5  03/15/2024  Therapeutic Exercise: -Supine bridges 2 sets of 5 reps, 3 second holds, pt states this is no longer hurting her, pt cued for max hip extension and decreased knee flexion due to cramping -Nustep for 2 min, pt cued for 40 SPM, level 3  resistance, pain in knee reported requests to stop  Neuromuscular Re-education: -Modified crunch, 2 sets of 6 reps, 3 second hold, pt cued for anterior core activation -Modified side plank, 5 attempts per side, pt cued for LE placement and UE assistance -LTR 2 set of 10 reps bilaterally, pt cued to remain in pain free ROM -Bird dogs, LE only, 1 set of 5 reps bilaterally, pt cued for increased LE ROM Therapeutic Activity: -Sit to stands, 1 set of 5 reps, pt cued for core activation -Stair navigation, 2 bouts, 4 stairs, pt cued for reciprocal gait pattern  03/13/24 Supine on moist heat for lumbar  Decompression 2-5, 5X3  Piriformis stretch Rt 2X20  Cross over hip flexor stretch (figure 4) Rt 2X20  SKTC 3X20 each  LTR 10X5  Heelslides 10X each Seated:  sit to stands 5X no UE Nustep at EOS UE/LE 5 minutes level 3 seat  PATIENT EDUCATION:  Education details: Pt was educated on findings of PT evaluation, prognosis, frequency of therapy visits and rationale, attendance policy, and HEP if given.   Person educated: Patient Education method: Explanation, Verbal cues, and Handouts Education comprehension: verbalized understanding, verbal cues required, and needs further education  HOME EXERCISE PROGRAM: Access Code: [email or web address] URL: [email or web address] Date: 03/06/2024 Prepared by: Lang Ada  Exercises -- Supine Lower Trunk Rotation  - 1 x daily - 7 x weekly - 3 sets - 10 reps - Sit to Stand  with Armchair  - 1 x daily - 7 x weekly - 3 sets - 10 reps  03/08/24: -Decompression 2-5 5x   Access Code: [email or web address] URL: [email or web address] Date: 03/13/2024 - Hooklying Single Knee to Chest Stretch  - 1 x daily - 7 x weekly - 3 reps - 20 sec hold - Supine Piriformis Stretch with Foot on Ground  - 1 x daily - 7 x weekly - 2 reps - 20 sec hold - Supine Figure 4 Piriformis Stretch  - 1 x daily - 7 x weekly - 2 reps - 20 sec hold - Supine Heel Slide  - 1 x daily  - 7 x weekly - 10 reps  03/28/24:  Decompression with RTB  03/30/24: - Supine Hamstring Stretch  - 2 x daily - 7 x weekly - 1 sets - 3 reps - 30 hold   ASSESSMENT:  CLINICAL IMPRESSION: Pt continues to have difficulty with standing from standard chair height and reports of decreased standing tolerance.  Increased session focus with gluteal strengthening in standing positions.  Pt limited by musculature fatigue with reports of increased soreness in thighs and knees.  No reports of increased pain through session.  Was limited by fatigue with activitiy with a few seated rest breaks needed.  Reports she has lost current exercises program, given new copy.    Eval:  Patient is a 88 y.o. female who was seen today for physical therapy evaluation and treatment for M43.16 (ICD-10-CM) - Spondylolisthesis of lumbar region G89.29,M54.41 (ICD-10-CM) - Chronic bilateral low back pain with right-sided sciatica.  Patient demonstrates increased low back pain, decreased LE/core strength, abnormal gait pattern, impaired functional mobility and balance. Patient also demonstrates difficulty with ambulation during today's session with decreased stride length and velocity noted. Patient also demonstrates increased low back pain with lumbar AROM with the worst symptom reproduction with extension and left lateral flexion. Patient requires education on the role of PT, prognosis, imaging results, and importance of HEP compliance. Patient would benefit from skilled physical therapy for decreased low back pain, increased endurance with ambulation, increased LE/core strength, and balance for improved gait quality, return to higher level of function with ADLs, and progress towards therapy goals.   OBJECTIVE IMPAIRMENTS: Abnormal gait, decreased balance, decreased endurance, decreased knowledge of use of DME, decreased mobility, difficulty walking, decreased ROM, decreased strength, and pain.   ACTIVITY LIMITATIONS: carrying,  lifting, bending, sitting, standing, squatting, stairs, transfers, bed mobility, and bathing  PARTICIPATION LIMITATIONS: meal prep, cleaning, laundry, community activity, and yard work  PERSONAL FACTORS: Age, Fitness, Past/current experiences, and Time since onset of injury/illness/exacerbation are also affecting patient's functional outcome.   REHAB POTENTIAL: Fair chronic in nature  CLINICAL DECISION MAKING: Stable/uncomplicated  EVALUATION COMPLEXITY: Low   GOALS: Goals reviewed with patient? No  SHORT TERM GOALS: Target date: 03/27/24  Pt will be independent with HEP in order to demonstrate participation in Physical Therapy POC.  Baseline: Goal status: INITIAL  2.  Pt will report 3/10 pain with lumbar spine mobility in order to demonstrate improved pain with ADLs.  Baseline:  Goal status: INITIAL  LONG TERM GOALS: Target date: 04/17/24  Pt will improve 5TSTS by at least 5 seconds in order to demonstrate improved functional strength to return to desired activities.  Baseline: see objective.  Goal status: INITIAL  2.  Pt will improve 2 MWT by at least 40 feet in order to demonstrate improved functional ambulatory capacity in community setting.  Baseline: see objective.  Goal status: INITIAL  3.  Pt will improve Modified Oswestry score by at least 6 points in order to demonstrate improved pain with functional goals and outcomes. Baseline: see objective.  Goal status: INITIAL  4.  Pt will report 1/10 pain with mobility in order to demonstrate reduced pain with ADLs lasting greater than 30 minutes.  Baseline: see objective.  Goal status: INITIAL   PLAN:  PT FREQUENCY: 1-2x/week  PT DURATION: 6 weeks  PLANNED INTERVENTIONS: 97110-Therapeutic exercises, 97530- Therapeutic activity, [id number]- Neuromuscular re-education, 97535- Self Care, [id number]- Manual therapy, [phone]- Gait training, [phone]- Electrical stimulation (unattended), [phone]- Electrical stimulation (manual),  Patient/Family education, Balance training, Stair training, Spinal mobilization, DME instructions, Cryotherapy, and Moist heat.  PLAN FOR NEXT SESSION: Progress core and LE strengthening, palpate low back, manual techniques if appropriate.  Increase focus with standing tolerance.  Augustin Mclean, LPTA/CLT; CBIS [phone]  4:14 PM, 04/04/24

## 2024-04-10 ENCOUNTER — Ambulatory Visit (HOSPITAL_COMMUNITY): Attending: Neurosurgery

## 2024-04-10 ENCOUNTER — Encounter (HOSPITAL_COMMUNITY): Payer: Self-pay

## 2024-04-10 DIAGNOSIS — R201 Hypoesthesia of skin: Secondary | ICD-10-CM | POA: Insufficient documentation

## 2024-04-10 DIAGNOSIS — M5459 Other low back pain: Secondary | ICD-10-CM | POA: Diagnosis present

## 2024-04-10 DIAGNOSIS — Z7409 Other reduced mobility: Secondary | ICD-10-CM | POA: Insufficient documentation

## 2024-04-10 NOTE — Therapy (Signed)
 OUTPATIENT PHYSICAL THERAPY THORACOLUMBAR TREATMENT   Patient Name: Allison Morris MRN: [id number] DOB:April 20, 1936, 88 y.o., female Today's Date: 04/10/2024  END OF SESSION:  PT End of Session - 04/10/24 1416     Visit Number 9    Number of Visits 12    Date for Recertification  04/17/24    Authorization Type BCBS MEDICARE    Authorization Time Period no auth required for BCBS Med    Progress Note Due on Visit 10    PT Start Time 1416    PT Stop Time 1454    PT Time Calculation (min) 38 min    Activity Tolerance Patient tolerated treatment well;No increased pain    Behavior During Therapy [facility] for tasks assessed/performed              Past Medical History:  Diagnosis Date   Aortic dilatation    a. echo 08/2017 showed EF 65-70%, grade 1 DD, mild AI, mildly dilated ascending aorta (43mm), mild MR, mild TR.    Aortic regurgitation    Arthritis    CKD (chronic kidney disease), stage III (HCC)    Essential hypertension    GERD (gastroesophageal reflux disease)    History of DVT (deep vein thrombosis)    Left leg 1997    Hyperlipemia    Hypertensive urgency 02/05/2022   Mild aortic insufficiency    Mild mitral regurgitation    Mild tricuspid insufficiency    Seasonal allergies    Past Surgical History:  Procedure Laterality Date   APPENDECTOMY     CARPAL TUNNEL RELEASE     IR RADIOLOGIST EVAL & MGMT  01/21/2021   IR SACROPLASTY BILATERAL  12/18/2020   MASS EXCISION Right 08/07/2019   Procedure: EXCISION CYST RIGHT INDEX AND RIGHT MIDDLE FINGERS WITH DEBRIDEMENT OF METACARPALPHALANGEAL JOINTS;  Surgeon: Murrell Kuba, MD;  Location: [facility];  Service: Orthopedics;  Laterality: Right;   TONSILLECTOMY     Patient Active Problem List   Diagnosis Date Noted   Hypertensive urgency 02/05/2022   Pneumonia of both lungs due to infectious organism 07/20/2021   Acute respiratory failure with hypoxia (HCC) 07/20/2021   Stage 3b chronic kidney disease (CKD) (HCC)  07/20/2021   Mass of finger of right hand 05/30/2019   Osteoarthritis of both hands 05/30/2019   Orthostatic lightheadedness 12/11/2017   TIA (transient ischemic attack) 11/14/2015   Hypertension    Hyperlipemia    History of DVT (deep vein thrombosis)    Aortic dilatation     PCP: Shona Norleen PEDLAR, MD   REFERRING PROVIDER: Clois Fret, MD  REFERRING DIAG: M43.16 (ICD-10-CM) - Spondylolisthesis of lumbar region G89.29,M54.41 (ICD-10-CM) - Chronic bilateral low back pain with right-sided sciatica  Rationale for Evaluation and Treatment: Rehabilitation  THERAPY DIAG:  Other low back pain  Impaired functional mobility, balance, gait, and endurance  Impaired sensation  ONSET DATE: over 10 years ago, gotten worse in the last 4 years  SUBJECTIVE:  SUBJECTIVE STATEMENT: Pt states her right hip has been bothering her since last session. Pt reports 5/10 pain this date. Pt states she has been doing the HEP.   Eval:  Pt states back pain has been going on for years, no MOI. Pt states she worked for [employer], in [place]. Pt states the pain has gotten worse in the last four years. Pt states they put cement in her back about 6 years ago, fixed the left side but not the right. Pt states she has been using the cane for about 2 years, to help with endurance. Pt states right foot is numb about half of the time which bothers her balance.  PERTINENT HISTORY:  Stage IV kidney disease Blood pressure fluctuations Gel injections on both sides  PAIN:  Are you having pain? Yes: NPRS scale: 2-3/10 Pain location: Rt foot Pain description: sharp pains Aggravating factors: standing, vacuuming/sweeping Relieving factors: blue emu rub, move around  PRECAUTIONS: None  RED FLAGS: None   WEIGHT BEARING  RESTRICTIONS: No  FALLS:  Has patient fallen in last 6 months? No  LIVING ENVIRONMENT: Lives with: lives alone Lives in: House/apartment Stairs: No Has following equipment at home: Single point cane and Environmental Consultant - 4 wheeled  OCCUPATION: retired  PLOF: Independent and Independent with basic ADLs  PATIENT GOALS: decrease the back pain, increased activity tolerance, pt would like to return to yard work with flowers.  NEXT MD VISIT: around the first of November  OBJECTIVE:  Note: Objective measures were completed at Evaluation unless otherwise noted.  DIAGNOSTIC FINDINGS:  None  PATIENT SURVEYS:  Modified Oswestry:  21 / 50 = 42.0 %  COGNITION: Overall cognitive status: pt on meds for memory     SENSATION: Light touch: Impaired , right foot stays numb about half the time  POSTURE: rounded shoulders, forward head, posterior pelvic tilt, and flexed trunk   PALPATION: Tight hamstringsL Rt LE 115 degrees  LUMBAR ROM:   AROM eval  Flexion 75  Extension 15, pain  Right lateral flexion 20, slight pain  Left lateral flexion 20 worse pain  Right rotation WFL, slight pain  Left rotation WFL, slight pain   (Blank rows = not tested)  LOWER EXTREMITY ROM:     Active  Right eval Left eval  Hip flexion    Hip extension    Hip abduction    Hip adduction    Hip internal rotation    Hip external rotation    Knee flexion    Knee extension    Ankle dorsiflexion    Ankle plantarflexion    Ankle inversion    Ankle eversion     (Blank rows = not tested)  LOWER EXTREMITY MMT:     MMT Right eval Left eval  Hip flexion 3+ 4-  Hip extension 3 3  Hip abduction 3- 3+  Hip adduction 4 4  Hip internal rotation    Hip external rotation    Knee flexion 3 3+  Knee extension 4- 4-  Ankle dorsiflexion 4- 4-  Ankle plantarflexion    Ankle inversion    Ankle eversion     (Blank rows = not tested)  LUMBAR SPECIAL TESTS:  Straight leg raise test: Negative  FUNCTIONAL  TESTS:  5 times sit to stand: 25.07 seconds 2 minute walk test: 187 ft with SPC, increased LBP and radicular symtoms Rt LE   03/08/24:  Balance assessment NBOS- able to stand 60 no HHA Tandem stance Rt forward 26.3  Lt forward 5 increased pain LBP and Rt LE SLS unable due to pain  GAIT: Distance walked: 80 feet to and from treatment area Assistive device utilized: Single point cane Level of assistance: Modified independence Comments: pt demonstrates significantly slow gait speed and decreased stride length bilaterally  TREATMENT DATE:  04/10/2024  Therapeutic Exercise: -Nustep, 5 minutes, level 3 resistance, pt cued for 70 SPM throughout -Supine bridges 2 sets of 7 reps, 3 second holds, blue theraband, pt cued for max hip extension  Neuromuscular Re-education: -Core ball push downs, 1 set of 6, 5 second holds, fwd and bilateral, red ball on table -LTR 2 set of 10 reps bilaterally on green exercise ball, pt cued to remain in pain free ROM -Bird dogs, LE only, 1 set of 10 reps bilaterally, pt cued for increased LE ROM Therapeutic Activity: -Sit to stands with 5 lb kettle bell at chest (1st set only), 2 set of 5 reps, pt cued for core activation and breathing   04/04/24: Nustep L3 resistance x 5' UE/LE Sit to stand with elevated height 10x [place] front of chair cueing for mechanics Forward flexed over counter donkey kicks 10x 3 holds Abduction 10x  Supine: Hamstring stretch 3x30 hands behind knee                                   04/02/24 EXERCISE LOG  Exercise Repetitions and Resistance Comments  Bridge  15 reps    Supine hip ADD isometric  2 minutes w/ 3 second hold    Lower trunk rotation  2 minutes    SLR   15 reps each    Double knee to chest  2.5 minutes  LE supported on green ball  Sit to stand  10 reps  21.5 table height   Gait training  With SPC; cueing for upright stance    Nustep  L3 x 7 minutes @ seat 10    Blank cell = exercise not performed today    03/30/24: Supine:  Bridge 10x 5  LTR 5x 10 pain free range  Nerve glide   Hamstring length measurement Rt LE 115 degrees   Hamstring stretch 3x 30 Sidelying:  Clam with RTB 10x 5 Seated: STS 10x elevated height cueing for mechanics   PATIENT EDUCATION:  Education details: Pt was educated on findings of PT evaluation, prognosis, frequency of therapy visits and rationale, attendance policy, and HEP if given.   Person educated: Patient Education method: Explanation, Verbal cues, and Handouts Education comprehension: verbalized understanding, verbal cues required, and needs further education  HOME EXERCISE PROGRAM: Access Code: [email or web address] URL: [email or web address] Date: 03/06/2024 Prepared by: Lang Ada  Exercises -- Supine Lower Trunk Rotation  - 1 x daily - 7 x weekly - 3 sets - 10 reps - Sit to Stand with Armchair  - 1 x daily - 7 x weekly - 3 sets - 10 reps  03/08/24: -Decompression 2-5 5x   Access Code: [email or web address] URL: [email or web address] Date: 03/13/2024 - Hooklying Single Knee to Chest Stretch  - 1 x daily - 7 x weekly - 3 reps - 20 sec hold - Supine Piriformis Stretch with Foot on Ground  - 1 x daily - 7 x weekly - 2 reps - 20 sec hold - Supine Figure 4 Piriformis Stretch  - 1 x daily - 7 x weekly - 2 reps - 20 sec hold - Supine Heel Slide  - 1 x  daily - 7 x weekly - 10 reps  03/28/24:  Decompression with RTB  03/30/24: - Supine Hamstring Stretch  - 2 x daily - 7 x weekly - 1 sets - 3 reps - 30 hold   ASSESSMENT:  CLINICAL IMPRESSION: Patient continues to demonstrate R hip and LE pain, decreased LE/core strength, decreased gait quality and balance. Patient also still demonstrates need for multiple seated rest breaks during today's session. Patient able to progress dynamic balance and core activation exercises today with STS variation and core push downs, good performance with verbal cueing. Patient would continue to benefit  from skilled physical therapy for decreased low back and LE pain, increased endurance with ambulation, increased LE/core strength, and improved balance for improved quality of life, improved independence with gait training and continued progress towards therapy goals.    Eval:  Patient is a 88 y.o. female who was seen today for physical therapy evaluation and treatment for M43.16 (ICD-10-CM) - Spondylolisthesis of lumbar region G89.29,M54.41 (ICD-10-CM) - Chronic bilateral low back pain with right-sided sciatica.  Patient demonstrates increased low back pain, decreased LE/core strength, abnormal gait pattern, impaired functional mobility and balance. Patient also demonstrates difficulty with ambulation during today's session with decreased stride length and velocity noted. Patient also demonstrates increased low back pain with lumbar AROM with the worst symptom reproduction with extension and left lateral flexion. Patient requires education on the role of PT, prognosis, imaging results, and importance of HEP compliance. Patient would benefit from skilled physical therapy for decreased low back pain, increased endurance with ambulation, increased LE/core strength, and balance for improved gait quality, return to higher level of function with ADLs, and progress towards therapy goals.   OBJECTIVE IMPAIRMENTS: Abnormal gait, decreased balance, decreased endurance, decreased knowledge of use of DME, decreased mobility, difficulty walking, decreased ROM, decreased strength, and pain.   ACTIVITY LIMITATIONS: carrying, lifting, bending, sitting, standing, squatting, stairs, transfers, bed mobility, and bathing  PARTICIPATION LIMITATIONS: meal prep, cleaning, laundry, community activity, and yard work  PERSONAL FACTORS: Age, Fitness, Past/current experiences, and Time since onset of injury/illness/exacerbation are also affecting patient's functional outcome.   REHAB POTENTIAL: Fair chronic in  nature  CLINICAL DECISION MAKING: Stable/uncomplicated  EVALUATION COMPLEXITY: Low   GOALS: Goals reviewed with patient? No  SHORT TERM GOALS: Target date: 03/27/24  Pt will be independent with HEP in order to demonstrate participation in Physical Therapy POC.  Baseline: Goal status: INITIAL  2.  Pt will report 3/10 pain with lumbar spine mobility in order to demonstrate improved pain with ADLs.  Baseline:  Goal status: INITIAL  LONG TERM GOALS: Target date: 04/17/24  Pt will improve 5TSTS by at least 5 seconds in order to demonstrate improved functional strength to return to desired activities.  Baseline: see objective.  Goal status: INITIAL  2.  Pt will improve 2 MWT by at least 40 feet in order to demonstrate improved functional ambulatory capacity in community setting.  Baseline: see objective.  Goal status: INITIAL  3.  Pt will improve Modified Oswestry score by at least 6 points in order to demonstrate improved pain with functional goals and outcomes. Baseline: see objective.  Goal status: INITIAL  4.  Pt will report 1/10 pain with mobility in order to demonstrate reduced pain with ADLs lasting greater than 30 minutes.  Baseline: see objective.  Goal status: INITIAL   PLAN:  PT FREQUENCY: 1-2x/week  PT DURATION: 6 weeks  PLANNED INTERVENTIONS: 97110-Therapeutic exercises, 97530- Therapeutic activity, [id number]- Neuromuscular re-education, 97535- Self Care,  [id number]- Manual therapy, [id number]- Gait training, [phone]- Electrical stimulation (unattended), [phone]- Electrical stimulation (manual), Patient/Family education, Balance training, Stair training, Spinal mobilization, DME instructions, Cryotherapy, and Moist heat.  PLAN FOR NEXT SESSION: Progress core and LE strengthening, palpate low back, manual techniques if appropriate.  Increase focus with standing tolerance.  Lang Ada, PT, DPT [facility] Office: [phone] 2:55 PM, 04/10/24

## 2024-04-12 ENCOUNTER — Encounter (HOSPITAL_COMMUNITY): Payer: Self-pay

## 2024-04-12 ENCOUNTER — Ambulatory Visit (HOSPITAL_COMMUNITY)

## 2024-04-12 DIAGNOSIS — R201 Hypoesthesia of skin: Secondary | ICD-10-CM

## 2024-04-12 DIAGNOSIS — M5459 Other low back pain: Secondary | ICD-10-CM

## 2024-04-12 DIAGNOSIS — Z7409 Other reduced mobility: Secondary | ICD-10-CM

## 2024-04-12 NOTE — Therapy (Signed)
 OUTPATIENT PHYSICAL THERAPY THORACOLUMBAR TREATMENT  Progress Note Reporting Period 03/06/24 to 04/12/24  See note below for Objective Data and Assessment of Progress/Goals.     Patient Name: Allison Morris MRN: [id number] DOB:07-03-1935, 88 y.o., female Today's Date: 04/12/2024  END OF SESSION:  PT End of Session - 04/12/24 1331     Visit Number 10    Number of Visits 12    Date for Recertification  04/17/24    Authorization Type [employer] MEDICARE    Authorization Time Period no auth required for BC[employer] Med    Progress Note Due on Visit 10    PT Start Time 1332    PT Stop Time 1412    PT Time Calculation (min) 40 min    Activity Tolerance Patient tolerated treatment well;No increased pain    Behavior During Therapy [facility] for tasks assessed/performed              Past Medical History:  Diagnosis Date   Aortic dilatation    a. echo 08/2017 showed EF 65-70%, grade 1 DD, mild AI, mildly dilated ascending aorta (43mm), mild MR, mild TR.    Aortic regurgitation    Arthritis    CKD (chronic kidney disease), stage III (HCC)    Essential hypertension    GERD (gastroesophageal reflux disease)    History of DVT (deep vein thrombosis)    Left leg 1997    Hyperlipemia    Hypertensive urgency 02/05/2022   Mild aortic insufficiency    Mild mitral regurgitation    Mild tricuspid insufficiency    Seasonal allergies    Past Surgical History:  Procedure Laterality Date   APPENDECTOMY     CARPAL TUNNEL RELEASE     IR RADIOLOGIST EVAL & MGMT  01/21/2021   IR SACROPLASTY BILATERAL  12/18/2020   MASS EXCISION Right 08/07/2019   Procedure: EXCISION CYST RIGHT INDEX AND RIGHT MIDDLE FINGERS WITH DEBRIDEMENT OF METACARPALPHALANGEAL JOINTS;  Surgeon: Murrell Kuba, MD;  Location: [facility];  Service: Orthopedics;  Laterality: Right;   TONSILLECTOMY     Patient Active Problem List   Diagnosis Date Noted   Hypertensive urgency 02/05/2022   Pneumonia of both lungs due to  infectious organism 07/20/2021   Acute respiratory failure with hypoxia (HCC) 07/20/2021   Stage 3b chronic kidney disease (CKD) (HCC) 07/20/2021   Mass of finger of right hand 05/30/2019   Osteoarthritis of both hands 05/30/2019   Orthostatic lightheadedness 12/11/2017   TIA (transient ischemic attack) 11/14/2015   Hypertension    Hyperlipemia    History of DVT (deep vein thrombosis)    Aortic dilatation     PCP: Shona Norleen PEDLAR, MD   REFERRING PROVIDER: Clois Fret, MD  REFERRING DIAG: M43.16 (ICD-10-CM) - Spondylolisthesis of lumbar region G89.29,M54.41 (ICD-10-CM) - Chronic bilateral low back pain with right-sided sciatica  Rationale for Evaluation and Treatment: Rehabilitation  THERAPY DIAG:  Other low back pain  Impaired functional mobility, balance, gait, and endurance  Impaired sensation  ONSET DATE: over 10 years ago, gotten worse in the last 4 years  SUBJECTIVE:  SUBJECTIVE STATEMENT:  Pt reports 5/10 pain this date. Pt states she has been doing the HEP. Pt feels 50% better since the start of therapy. Pt complaining of foot pain.   Eval:  Pt states back pain has been going on for years, no MOI. Pt states she worked for [employer], in [place]. Pt states the pain has gotten worse in the last four years. Pt states they put cement in her back about 6 years ago, fixed the left side but not the right. Pt states she has been using the cane for about 2 years, to help with endurance. Pt states right foot is numb about half of the time which bothers her balance.  PERTINENT HISTORY:  Stage IV kidney disease Blood pressure fluctuations Gel injections on both sides  PAIN:  Are you having pain? Yes: NPRS scale: 2-3/10 Pain location: Rt foot Pain description: sharp pains Aggravating factors:  standing, vacuuming/sweeping Relieving factors: blue emu rub, move around  PRECAUTIONS: None  RED FLAGS: None   WEIGHT BEARING RESTRICTIONS: No  FALLS:  Has patient fallen in last 6 months? No  LIVING ENVIRONMENT: Lives with: lives alone Lives in: House/apartment Stairs: No Has following equipment at home: Single point cane and Environmental Consultant - 4 wheeled  OCCUPATION: retired  PLOF: Independent and Independent with basic ADLs  PATIENT GOALS: decrease the back pain, increased activity tolerance, pt would like to return to yard work with flowers.  NEXT MD VISIT: around the first of November  OBJECTIVE:  Note: Objective measures were completed at Evaluation unless otherwise noted.  DIAGNOSTIC FINDINGS:  None  PATIENT SURVEYS:  Modified Oswestry:  21 / 50 = 42.0 % 04/12/24: 18 / 50 = 36.0 %  COGNITION: Overall cognitive status: pt on meds for memory     SENSATION: Light touch: Impaired , right foot stays numb about half the time  POSTURE: rounded shoulders, forward head, posterior pelvic tilt, and flexed trunk   PALPATION: Tight hamstringsL Rt LE 115 degrees  LUMBAR ROM:   AROM eval  Flexion 75  Extension 15, pain  Right lateral flexion 20, slight pain  Left lateral flexion 20 worse pain  Right rotation WFL, slight pain  Left rotation WFL, slight pain   (Blank rows = not tested)  LOWER EXTREMITY ROM:     Active  Right eval Left eval  Hip flexion    Hip extension    Hip abduction    Hip adduction    Hip internal rotation    Hip external rotation    Knee flexion    Knee extension    Ankle dorsiflexion    Ankle plantarflexion    Ankle inversion    Ankle eversion     (Blank rows = not tested)  LOWER EXTREMITY MMT:     MMT Right eval Left eval  Hip flexion 3+ 4-  Hip extension 3 3  Hip abduction 3- 3+  Hip adduction 4 4  Hip internal rotation    Hip external rotation    Knee flexion 3 3+  Knee extension 4- 4-  Ankle dorsiflexion 4- 4-  Ankle  plantarflexion    Ankle inversion    Ankle eversion     (Blank rows = not tested)  LUMBAR SPECIAL TESTS:  Straight leg raise test: Negative  FUNCTIONAL TESTS:  5 times sit to stand: 25.07 seconds 2 minute walk test: 187 ft with SPC, increased LBP and radicular symtoms Rt LE   03/08/24:  Balance assessment NBOS- able to stand 60  no HHA Tandem stance Rt forward 26.3    Lt forward 5 increased pain LBP and Rt LE SLS unable due to pain  04/12/24:  5TSTS: 19.38 seconds : 262, with cane, increased low back pain with last 70 feet  GAIT: Distance walked: 80 feet to and from treatment area Assistive device utilized: Single point cane Level of assistance: Modified independence Comments: pt demonstrates significantly slow gait speed and decreased stride length bilaterally  TREATMENT DATE:  04/10/2024 Progress note: , 5TSTS, modified Oswestry, HEP review and education  Therapeutic Exercise: -Lumbar ball roll outs, 1 set of 8 reps, pt cued for pain free ROM  -Shoulder extensions, RTB at chest level, 1 set of 10 reps, pt cued for eccentric control Neuromuscular Re-education: -Core ball push downs, 1 set of 5, 5 second holds, fwd and bilateral, red ball on stool -Paloff press, RTB at chest, 1 set of 10 reps bilaterally, pt cued for increased core control Therapeutic Activity: -Sit to stands, staggered stance, 2 set of 5 reps, pt cued for core activation and breathing   04/04/24: Nustep L3 resistance x 5' UE/LE Sit to stand with elevated height 10x [place] front of chair cueing for mechanics Forward flexed over counter donkey kicks 10x 3 holds Abduction 10x  Supine: Hamstring stretch 3x30 hands behind knee                                   04/02/24 EXERCISE LOG  Exercise Repetitions and Resistance Comments  Bridge  15 reps    Supine hip ADD isometric  2 minutes w/ 3 second hold    Lower trunk rotation  2 minutes    SLR   15 reps each    Double knee to chest  2.5  minutes  LE supported on green ball  Sit to stand  10 reps  21.5 table height   Gait training  With SPC; cueing for upright stance    Nustep  L3 x 7 minutes @ seat 10    Blank cell = exercise not performed today   PATIENT EDUCATION:  Education details: Pt was educated on findings of PT evaluation, prognosis, frequency of therapy visits and rationale, attendance policy, and HEP if given.   Person educated: Patient Education method: Explanation, Verbal cues, and Handouts Education comprehension: verbalized understanding, verbal cues required, and needs further education  HOME EXERCISE PROGRAM: Access Code: [email or web address] URL: [email or web address] Date: 03/06/2024 Prepared by: Lang Ada  Exercises -- Supine Lower Trunk Rotation  - 1 x daily - 7 x weekly - 3 sets - 10 reps - Sit to Stand with Armchair  - 1 x daily - 7 x weekly - 3 sets - 10 reps  03/08/24: -Decompression 2-5 5x   Access Code: [email or web address] URL: [email or web address] Date: 03/13/2024 - Hooklying Single Knee to Chest Stretch  - 1 x daily - 7 x weekly - 3 reps - 20 sec hold - Supine Piriformis Stretch with Foot on Ground  - 1 x daily - 7 x weekly - 2 reps - 20 sec hold - Supine Figure 4 Piriformis Stretch  - 1 x daily - 7 x weekly - 2 reps - 20 sec hold - Supine Heel Slide  - 1 x daily - 7 x weekly - 10 reps  03/28/24:  Decompression with RTB  03/30/24: - Supine Hamstring Stretch  - 2 x daily - 7 x weekly - 1 sets - 3 reps -  30 hold   ASSESSMENT:  CLINICAL IMPRESSION: Patient continues to demonstrate R hip and low back pain, decreased LE/core strength, decreased gait quality and balance. Patient also still demonstrates need for multiple seated rest breaks during today's session although improved endurance noted on . Patient able to progress dynamic balance and core activation exercises today with STS variation and paloff presses, good performance with verbal cueing. Pt able to demonstrate  ability to meet 3/6 goals during this episode of care with good progress towards remaining goals unmet. Patient would continue to benefit from skilled physical therapy for decreased low back and LE pain, increased endurance with ambulation, increased LE/core strength, and improved balance for improved quality of life, improved independence with gait training and continued progress towards therapy goals.    Eval:  Patient is a 88 y.o. female who was seen today for physical therapy evaluation and treatment for M43.16 (ICD-10-CM) - Spondylolisthesis of lumbar region G89.29,M54.41 (ICD-10-CM) - Chronic bilateral low back pain with right-sided sciatica.  Patient demonstrates increased low back pain, decreased LE/core strength, abnormal gait pattern, impaired functional mobility and balance. Patient also demonstrates difficulty with ambulation during today's session with decreased stride length and velocity noted. Patient also demonstrates increased low back pain with lumbar AROM with the worst symptom reproduction with extension and left lateral flexion. Patient requires education on the role of PT, prognosis, imaging results, and importance of HEP compliance. Patient would benefit from skilled physical therapy for decreased low back pain, increased endurance with ambulation, increased LE/core strength, and balance for improved gait quality, return to higher level of function with ADLs, and progress towards therapy goals.   OBJECTIVE IMPAIRMENTS: Abnormal gait, decreased balance, decreased endurance, decreased knowledge of use of DME, decreased mobility, difficulty walking, decreased ROM, decreased strength, and pain.   ACTIVITY LIMITATIONS: carrying, lifting, bending, sitting, standing, squatting, stairs, transfers, bed mobility, and bathing  PARTICIPATION LIMITATIONS: meal prep, cleaning, laundry, community activity, and yard work  PERSONAL FACTORS: Age, Fitness, Past/current experiences, and Time since  onset of injury/illness/exacerbation are also affecting patient's functional outcome.   REHAB POTENTIAL: Fair chronic in nature  CLINICAL DECISION MAKING: Stable/uncomplicated  EVALUATION COMPLEXITY: Low   GOALS: Goals reviewed with patient? No  SHORT TERM GOALS: Target date: 03/27/24  Pt will be independent with HEP in order to demonstrate participation in Physical Therapy POC.  Baseline: Goal status: IN PROGRESS  2.  Pt will report 3/10 pain with lumbar spine mobility in order to demonstrate improved pain with ADLs.  Baseline:  Goal status: MET  LONG TERM GOALS: Target date: 04/17/24  Pt will improve 5TSTS by at least 5 seconds in order to demonstrate improved functional strength to return to desired activities.  Baseline: see objective.  Goal status: MET  2.  Pt will improve 2 MWT by at least 40 feet in order to demonstrate improved functional ambulatory capacity in community setting.  Baseline: see objective.  Goal status: MET  3.  Pt will improve Modified Oswestry score by at least 6 points in order to demonstrate improved pain with functional goals and outcomes. Baseline: see objective.  Goal status: IN PROGRESS  4.  Pt will report 1/10 pain with mobility in order to demonstrate reduced pain with ADLs lasting greater than 30 minutes.  Baseline: see objective.  Goal status: IN PROGRESS   PLAN:  PT FREQUENCY: 1-2x/week  PT DURATION: 6 weeks  PLANNED INTERVENTIONS: 97110-Therapeutic exercises, 97530- Therapeutic activity, [id number]- Neuromuscular re-education, 97535- Self Care, [id number]- Manual therapy, [id number]- Gait training,  [id number]- Electrical stimulation (unattended), [phone]- Electrical stimulation (manual), Patient/Family education, Balance training, Stair training, Spinal mobilization, DME instructions, Cryotherapy, and Moist heat.  PLAN FOR NEXT SESSION: Progress core and LE strengthening, palpate low back, manual techniques if appropriate.  Increase focus with standing  tolerance. Plan to see 2 more visits and then reassess.  Jaysten Essner, PT, DPT [facility] Office: [phone] 2:20 PM, 04/12/24

## 2024-04-18 ENCOUNTER — Encounter (HOSPITAL_COMMUNITY): Payer: Self-pay

## 2024-04-18 ENCOUNTER — Ambulatory Visit (HOSPITAL_COMMUNITY)

## 2024-04-18 DIAGNOSIS — R201 Hypoesthesia of skin: Secondary | ICD-10-CM

## 2024-04-18 DIAGNOSIS — M5459 Other low back pain: Secondary | ICD-10-CM | POA: Diagnosis not present

## 2024-04-18 DIAGNOSIS — Z7409 Other reduced mobility: Secondary | ICD-10-CM

## 2024-04-18 NOTE — Therapy (Signed)
 OUTPATIENT PHYSICAL THERAPY THORACOLUMBAR TREATMENT  Progress Note Reporting Period 03/06/24 to 04/12/24  See note below for Objective Data and Assessment of Progress/Goals.     Patient Name: Allison Morris MRN: [id number] DOB:November 20, 1935, 88 y.o., female Today's Date: 04/18/2024  END OF SESSION:  PT End of Session - 04/18/24 1416     Visit Number 11    Number of Visits 12    Date for Recertification  05/27/24    Authorization Type BCBS MEDICARE    Authorization Time Period no auth required for BC[employer] Med    Progress Note Due on Visit 10    PT Start Time 1416    PT Stop Time 1455    PT Time Calculation (min) 39 min    Activity Tolerance Patient tolerated treatment well;No increased pain    Behavior During Therapy [facility] for tasks assessed/performed               Past Medical History:  Diagnosis Date   Aortic dilatation    a. echo 08/2017 showed EF 65-70%, grade 1 DD, mild AI, mildly dilated ascending aorta (43mm), mild MR, mild TR.    Aortic regurgitation    Arthritis    CKD (chronic kidney disease), stage III (HCC)    Essential hypertension    GERD (gastroesophageal reflux disease)    History of DVT (deep vein thrombosis)    Left leg 1997    Hyperlipemia    Hypertensive urgency 02/05/2022   Mild aortic insufficiency    Mild mitral regurgitation    Mild tricuspid insufficiency    Seasonal allergies    Past Surgical History:  Procedure Laterality Date   APPENDECTOMY     CARPAL TUNNEL RELEASE     IR RADIOLOGIST EVAL & MGMT  01/21/2021   IR SACROPLASTY BILATERAL  12/18/2020   MASS EXCISION Right 08/07/2019   Procedure: EXCISION CYST RIGHT INDEX AND RIGHT MIDDLE FINGERS WITH DEBRIDEMENT OF METACARPALPHALANGEAL JOINTS;  Surgeon: Murrell Kuba, MD;  Location: [facility];  Service: Orthopedics;  Laterality: Right;   TONSILLECTOMY     Patient Active Problem List   Diagnosis Date Noted   Hypertensive urgency 02/05/2022   Pneumonia of both lungs due to  infectious organism 07/20/2021   Acute respiratory failure with hypoxia (HCC) 07/20/2021   Stage 3b chronic kidney disease (CKD) (HCC) 07/20/2021   Mass of finger of right hand 05/30/2019   Osteoarthritis of both hands 05/30/2019   Orthostatic lightheadedness 12/11/2017   TIA (transient ischemic attack) 11/14/2015   Hypertension    Hyperlipemia    History of DVT (deep vein thrombosis)    Aortic dilatation     PCP: Shona Norleen PEDLAR, MD   REFERRING PROVIDER: Clois Fret, MD  REFERRING DIAG: M43.16 (ICD-10-CM) - Spondylolisthesis of lumbar region G89.29,M54.41 (ICD-10-CM) - Chronic bilateral low back pain with right-sided sciatica  Rationale for Evaluation and Treatment: Rehabilitation  THERAPY DIAG:  Other low back pain  Impaired functional mobility, balance, gait, and endurance  Impaired sensation  ONSET DATE: over 10 years ago, gotten worse in the last 4 years  SUBJECTIVE:  SUBJECTIVE STATEMENT:  Pt reports 2/10 pain this date. Pt states she has been doing the HEP. Pt states she has not been doing much other than the HEP. Pt states she wishes she could get another shot in her low back.   Eval:  Pt states back pain has been going on for years, no MOI. Pt states she worked for [employer], in [place]. Pt states the pain has gotten worse in the last four years. Pt states they put cement in her back about 6 years ago, fixed the left side but not the right. Pt states she has been using the cane for about 2 years, to help with endurance. Pt states right foot is numb about half of the time which bothers her balance.  PERTINENT HISTORY:  Stage IV kidney disease Blood pressure fluctuations Gel injections on both sides  PAIN:  Are you having pain? Yes: NPRS scale: 2-3/10 Pain location: Rt foot Pain  description: sharp pains Aggravating factors: standing, vacuuming/sweeping Relieving factors: blue emu rub, move around  PRECAUTIONS: None  RED FLAGS: None   WEIGHT BEARING RESTRICTIONS: No  FALLS:  Has patient fallen in last 6 months? No  LIVING ENVIRONMENT: Lives with: lives alone Lives in: House/apartment Stairs: No Has following equipment at home: Single point cane and Environmental Consultant - 4 wheeled  OCCUPATION: retired  PLOF: Independent and Independent with basic ADLs  PATIENT GOALS: decrease the back pain, increased activity tolerance, pt would like to return to yard work with flowers.  NEXT MD VISIT: around the first of November  OBJECTIVE:  Note: Objective measures were completed at Evaluation unless otherwise noted.  DIAGNOSTIC FINDINGS:  None  PATIENT SURVEYS:  Modified Oswestry:  21 / 50 = 42.0 % 04/12/24: 18 / 50 = 36.0 %  COGNITION: Overall cognitive status: pt on meds for memory     SENSATION: Light touch: Impaired , right foot stays numb about half the time  POSTURE: rounded shoulders, forward head, posterior pelvic tilt, and flexed trunk   PALPATION: Tight hamstringsL Rt LE 115 degrees  LUMBAR ROM:   AROM eval  Flexion 75  Extension 15, pain  Right lateral flexion 20, slight pain  Left lateral flexion 20 worse pain  Right rotation WFL, slight pain  Left rotation WFL, slight pain   (Blank rows = not tested)  LOWER EXTREMITY ROM:     Active  Right eval Left eval  Hip flexion    Hip extension    Hip abduction    Hip adduction    Hip internal rotation    Hip external rotation    Knee flexion    Knee extension    Ankle dorsiflexion    Ankle plantarflexion    Ankle inversion    Ankle eversion     (Blank rows = not tested)  LOWER EXTREMITY MMT:     MMT Right eval Left eval  Hip flexion 3+ 4-  Hip extension 3 3  Hip abduction 3- 3+  Hip adduction 4 4  Hip internal rotation    Hip external rotation    Knee flexion 3 3+  Knee  extension 4- 4-  Ankle dorsiflexion 4- 4-  Ankle plantarflexion    Ankle inversion    Ankle eversion     (Blank rows = not tested)  LUMBAR SPECIAL TESTS:  Straight leg raise test: Negative  FUNCTIONAL TESTS:  5 times sit to stand: 25.07 seconds 2 minute walk test: 187 ft with SPC, increased LBP and radicular symtoms Rt LE  03/08/24:  Balance assessment NBOS- able to stand 60 no HHA Tandem stance Rt forward 26.3    Lt forward 5 increased pain LBP and Rt LE SLS unable due to pain  04/12/24:  5TSTS: 19.38 seconds : 262, with cane, increased low back pain with last 70 feet  GAIT: Distance walked: 80 feet to and from treatment area Assistive device utilized: Single point cane Level of assistance: Modified independence Comments: pt demonstrates significantly slow gait speed and decreased stride length bilaterally  TREATMENT DATE:  04/18/2024  Manual Therapy: -CPA/UPA mobilizations of Lumbar segments L2-L5, grade II-III  -STM of Lumbar Paraspinal musculature, bilaterally Therapeutic Exercise: -Nustep,6 minutes, level 4>2 resistance, pt cued for 70-80 spm -Supine bridges, 1 sets of 15 reps, 3 second holds, pt cued for max hip extension, BTB at knees -3 way kicks, BTB at knees, 1 set of 5 reps bilaterally, pt cued for core activation and increased LE ROM Neuromuscular Re-education: -Lower trunk rotations, 2 set of 10 reps, bilaterally, pt cued to remain in pain free ROM -Hook lying clamshells, 2 sets of 5 reps, pt cued for increased ROM, BTB at knees Therapeutic Activity: -Sit to stands, holding cone and weighted ball, 2 set of 10 reps, pt cued for core activation, alternating hands and breathing  04/10/2024 Progress note: , 5TSTS, modified Oswestry, HEP review and education  Therapeutic Exercise: -Lumbar ball roll outs, 1 set of 8 reps, pt cued for pain free ROM  -Shoulder extensions, RTB at chest level, 1 set of 10 reps, pt cued for eccentric control Neuromuscular  Re-education: -Core ball push downs, 1 set of 5, 5 second holds, fwd and bilateral, red ball on stool -Paloff press, RTB at chest, 1 set of 10 reps bilaterally, pt cued for increased core control Therapeutic Activity: -Sit to stands, holding cone and weighted ball, 2 set of 10 reps, pt cued for core activation, alternating hands and breathing   04/04/24: Nustep L3 resistance x 5' UE/LE Sit to stand with elevated height 10x Min[place]quat front of chair cueing for mechanics Forward flexed over counter donkey kicks 10x 3 holds Abduction 10x  Supine: Hamstring stretch 3x30 hands behind knee                                 PATIENT EDUCATION:  Education details: Pt was educated on findings of PT evaluation, prognosis, frequency of therapy visits and rationale, attendance policy, and HEP if given.   Person educated: Patient Education method: Explanation, Verbal cues, and Handouts Education comprehension: verbalized understanding, verbal cues required, and needs further education  HOME EXERCISE PROGRAM: Access Code: [email or web address] URL: [email or web address] Date: 03/06/2024 Prepared by: Lang Ada  Exercises -- Supine Lower Trunk Rotation  - 1 x daily - 7 x weekly - 3 sets - 10 reps - Sit to Stand with Armchair  - 1 x daily - 7 x weekly - 3 sets - 10 reps  03/08/24: -Decompression 2-5 5x   Access Code: [email or web address] URL: [email or web address] Date: 03/13/2024 - Hooklying Single Knee to Chest Stretch  - 1 x daily - 7 x weekly - 3 reps - 20 sec hold - Supine Piriformis Stretch with Foot on Ground  - 1 x daily - 7 x weekly - 2 reps - 20 sec hold - Supine Figure 4 Piriformis Stretch  - 1 x daily - 7 x weekly - 2 reps - 20 sec hold - Supine Heel Slide  - 1 x  daily - 7 x weekly - 10 reps  03/28/24:  Decompression with RTB  03/30/24: - Supine Hamstring Stretch  - 2 x daily - 7 x weekly - 1 sets - 3 reps - 30 hold   ASSESSMENT:  CLINICAL IMPRESSION: Patient  continues to demonstrate low back pain and right hip pain, decreased LE/core strength, decreased gait quality and balance. Patient also demonstrates increased endurance with aerobic based exercise during today's session on nustep. Patient able to progress dynamic balance and core activation exercises today with STS variation and clamshells, good performance with verbal cueing. Patient would continue to benefit from skilled physical therapy for decreased low back pain, increased endurance with ambulation, increased LE/core strength, and improved balance for improved quality of life, improved independence with gait training and continued progress towards therapy goals. Probable discharge next session and then meeting back with neurosurgeon.      Eval:  Patient is a 88 y.o. female who was seen today for physical therapy evaluation and treatment for M43.16 (ICD-10-CM) - Spondylolisthesis of lumbar region G89.29,M54.41 (ICD-10-CM) - Chronic bilateral low back pain with right-sided sciatica.  Patient demonstrates increased low back pain, decreased LE/core strength, abnormal gait pattern, impaired functional mobility and balance. Patient also demonstrates difficulty with ambulation during today's session with decreased stride length and velocity noted. Patient also demonstrates increased low back pain with lumbar AROM with the worst symptom reproduction with extension and left lateral flexion. Patient requires education on the role of PT, prognosis, imaging results, and importance of HEP compliance. Patient would benefit from skilled physical therapy for decreased low back pain, increased endurance with ambulation, increased LE/core strength, and balance for improved gait quality, return to higher level of function with ADLs, and progress towards therapy goals.   OBJECTIVE IMPAIRMENTS: Abnormal gait, decreased balance, decreased endurance, decreased knowledge of use of DME, decreased mobility, difficulty walking,  decreased ROM, decreased strength, and pain.   ACTIVITY LIMITATIONS: carrying, lifting, bending, sitting, standing, squatting, stairs, transfers, bed mobility, and bathing  PARTICIPATION LIMITATIONS: meal prep, cleaning, laundry, community activity, and yard work  PERSONAL FACTORS: Age, Fitness, Past/current experiences, and Time since onset of injury/illness/exacerbation are also affecting patient's functional outcome.   REHAB POTENTIAL: Fair chronic in nature  CLINICAL DECISION MAKING: Stable/uncomplicated  EVALUATION COMPLEXITY: Low   GOALS: Goals reviewed with patient? No  SHORT TERM GOALS: Target date: 03/27/24  Pt will be independent with HEP in order to demonstrate participation in Physical Therapy POC.  Baseline: Goal status: IN PROGRESS  2.  Pt will report 3/10 pain with lumbar spine mobility in order to demonstrate improved pain with ADLs.  Baseline:  Goal status: MET  LONG TERM GOALS: Target date: 04/17/24  Pt will improve 5TSTS by at least 5 seconds in order to demonstrate improved functional strength to return to desired activities.  Baseline: see objective.  Goal status: MET  2.  Pt will improve 2 MWT by at least 40 feet in order to demonstrate improved functional ambulatory capacity in community setting.  Baseline: see objective.  Goal status: MET  3.  Pt will improve Modified Oswestry score by at least 6 points in order to demonstrate improved pain with functional goals and outcomes. Baseline: see objective.  Goal status: IN PROGRESS  4.  Pt will report 1/10 pain with mobility in order to demonstrate reduced pain with ADLs lasting greater than 30 minutes.  Baseline: see objective.  Goal status: IN PROGRESS   PLAN:  PT FREQUENCY: 1-2x/week  PT DURATION: 6 weeks  PLANNED INTERVENTIONS: 97110-Therapeutic exercises, 97530- Therapeutic activity, [id number]- Neuromuscular re-education, [phone]- Self Care, [id number]- Manual therapy, [phone]- Gait training, [phone]-  Electrical stimulation (unattended), [phone]- Electrical stimulation (manual), Patient/Family education, Balance training, Stair training, Spinal mobilization, DME instructions, Cryotherapy, and Moist heat.  PLAN FOR NEXT SESSION: Progress core and LE strengthening, palpate low back, manual techniques if appropriate.  Increase focus with standing tolerance. Plan to see 1 more visits and then reassess.  Lang Ada, PT, DPT [facility] Office: [phone] 2:58 PM, 04/18/24

## 2024-04-25 ENCOUNTER — Ambulatory Visit (HOSPITAL_COMMUNITY)

## 2024-04-25 ENCOUNTER — Encounter (HOSPITAL_COMMUNITY): Payer: Self-pay

## 2024-04-25 DIAGNOSIS — M5459 Other low back pain: Secondary | ICD-10-CM | POA: Diagnosis not present

## 2024-04-25 DIAGNOSIS — Z7409 Other reduced mobility: Secondary | ICD-10-CM

## 2024-04-25 DIAGNOSIS — R201 Hypoesthesia of skin: Secondary | ICD-10-CM

## 2024-04-25 NOTE — Therapy (Signed)
 OUTPATIENT PHYSICAL THERAPY THORACOLUMBAR TREATMENT  PHYSICAL THERAPY DISCHARGE SUMMARY  Visits from Start of Care: 11  Current functional level related to goals / functional outcomes: Lacking due to pain   Remaining deficits: Pain, gait independence   Education / Equipment: Self care, and HEP compliance and referral process   Patient agrees to discharge. Patient goals were partially met. Patient is being discharged due to lack of progress.    Patient Name: Allison Morris MRN: [id number] DOB:1936-03-16, 88 y.o., female Today's Date: 04/25/2024  END OF SESSION:  PT End of Session - 04/25/24 1416     Visit Number 12    Number of Visits 12    Date for Recertification  05/27/24    Authorization Type BCBS MEDICARE    Authorization Time Period no auth required for [facility]    PT Start Time 1417    PT Stop Time 1455    PT Time Calculation (min) 38 min    Activity Tolerance Patient tolerated treatment well    Behavior During Therapy [facility] for tasks assessed/performed                Past Medical History:  Diagnosis Date   Aortic dilatation    a. echo 08/2017 showed EF 65-70%, grade 1 DD, mild AI, mildly dilated ascending aorta (43mm), mild MR, mild TR.    Aortic regurgitation    Arthritis    CKD (chronic kidney disease), stage III (HCC)    Essential hypertension    GERD (gastroesophageal reflux disease)    History of DVT (deep vein thrombosis)    Left leg 1997    Hyperlipemia    Hypertensive urgency 02/05/2022   Mild aortic insufficiency    Mild mitral regurgitation    Mild tricuspid insufficiency    Seasonal allergies    Past Surgical History:  Procedure Laterality Date   APPENDECTOMY     CARPAL TUNNEL RELEASE     IR RADIOLOGIST EVAL & MGMT  01/21/2021   IR SACROPLASTY BILATERAL  12/18/2020   MASS EXCISION Right 08/07/2019   Procedure: EXCISION CYST RIGHT INDEX AND RIGHT MIDDLE FINGERS WITH DEBRIDEMENT OF METACARPALPHALANGEAL JOINTS;  Surgeon: Murrell Kuba,  MD;  Location: [facility];  Service: Orthopedics;  Laterality: Right;   TONSILLECTOMY     Patient Active Problem List   Diagnosis Date Noted   Hypertensive urgency 02/05/2022   Pneumonia of both lungs due to infectious organism 07/20/2021   Acute respiratory failure with hypoxia (HCC) 07/20/2021   Stage 3b chronic kidney disease (CKD) (HCC) 07/20/2021   Mass of finger of right hand 05/30/2019   Osteoarthritis of both hands 05/30/2019   Orthostatic lightheadedness 12/11/2017   TIA (transient ischemic attack) 11/14/2015   Hypertension    Hyperlipemia    History of DVT (deep vein thrombosis)    Aortic dilatation     PCP: Shona Norleen PEDLAR, MD   REFERRING PROVIDER: Clois Fret, MD  REFERRING DIAG: M43.16 (ICD-10-CM) - Spondylolisthesis of lumbar region G89.29,M54.41 (ICD-10-CM) - Chronic bilateral low back pain with right-sided sciatica  Rationale for Evaluation and Treatment: Rehabilitation  THERAPY DIAG:  Other low back pain  Impaired functional mobility, balance, gait, and endurance  Impaired sensation  ONSET DATE: over 10 years ago, gotten worse in the last 4 years  SUBJECTIVE:  SUBJECTIVE STATEMENT:  Pt states back is continuing to bother her reports 6/10 pain this date. Pt states she rubbed down her hip. Pt states she has shot the 2nd, and follow up with referring doc the 9th.   Eval:  Pt states back pain has been going on for years, no MOI. Pt states she worked for [employer], in [place]. Pt states the pain has gotten worse in the last four years. Pt states they put cement in her back about 6 years ago, fixed the left side but not the right. Pt states she has been using the cane for about 2 years, to help with endurance. Pt states right foot is numb about half of the time  which bothers her balance.  PERTINENT HISTORY:  Stage IV kidney disease Blood pressure fluctuations Gel injections on both sides  PAIN:  Are you having pain? Yes: NPRS scale: 2-3/10 Pain location: Rt foot Pain description: sharp pains Aggravating factors: standing, vacuuming/sweeping Relieving factors: blue emu rub, move around  PRECAUTIONS: None  RED FLAGS: None   WEIGHT BEARING RESTRICTIONS: No  FALLS:  Has patient fallen in last 6 months? No  LIVING ENVIRONMENT: Lives with: lives alone Lives in: House/apartment Stairs: No Has following equipment at home: Single point cane and Environmental Consultant - 4 wheeled  OCCUPATION: retired  PLOF: Independent and Independent with basic ADLs  PATIENT GOALS: decrease the back pain, increased activity tolerance, pt would like to return to yard work with flowers.  NEXT MD VISIT: around the first of November  OBJECTIVE:  Note: Objective measures were completed at Evaluation unless otherwise noted.  DIAGNOSTIC FINDINGS:  None  PATIENT SURVEYS:  Modified Oswestry:  21 / 50 = 42.0 % 04/12/24: 18 / 50 = 36.0 %  COGNITION: Overall cognitive status: pt on meds for memory     SENSATION: Light touch: Impaired , right foot stays numb about half the time  POSTURE: rounded shoulders, forward head, posterior pelvic tilt, and flexed trunk   PALPATION: Tight hamstringsL Rt LE 115 degrees  LUMBAR ROM:   AROM eval  Flexion 75  Extension 15, pain  Right lateral flexion 20, slight pain  Left lateral flexion 20 worse pain  Right rotation WFL, slight pain  Left rotation WFL, slight pain   (Blank rows = not tested)  LOWER EXTREMITY ROM:     Active  Right eval Left eval  Hip flexion    Hip extension    Hip abduction    Hip adduction    Hip internal rotation    Hip external rotation    Knee flexion    Knee extension    Ankle dorsiflexion    Ankle plantarflexion    Ankle inversion    Ankle eversion     (Blank rows = not  tested)  LOWER EXTREMITY MMT:     MMT Right eval Left eval  Hip flexion 3+ 4-  Hip extension 3 3  Hip abduction 3- 3+  Hip adduction 4 4  Hip internal rotation    Hip external rotation    Knee flexion 3 3+  Knee extension 4- 4-  Ankle dorsiflexion 4- 4-  Ankle plantarflexion    Ankle inversion    Ankle eversion     (Blank rows = not tested)  LUMBAR SPECIAL TESTS:  Straight leg raise test: Negative  FUNCTIONAL TESTS:  5 times sit to stand: 25.07 seconds 2 minute walk test: 187 ft with SPC, increased LBP and radicular symtoms Rt LE   03/08/24:  Balance assessment NBOS- able to stand 60 no HHA Tandem stance Rt forward 26.3    Lt forward 5 increased pain LBP and Rt LE SLS unable due to pain  04/12/24:  5TSTS: 19.38 seconds : 262, with cane, increased low back pain with last 70 feet  GAIT: Distance walked: 80 feet to and from treatment area Assistive device utilized: Single point cane Level of assistance: Modified independence Comments: pt demonstrates significantly slow gait speed and decreased stride length bilaterally  TREATMENT DATE:  04/18/2024  Discharge note: Pt educated on POC, independent progression of HEP, referral process, etc. Therapeutic Exercise: -Nustep, 5 minutes, level 3 resistance, pt cued for 70-80 spm -3 way kicks, BTB at knees, 1 set of 5 reps bilaterally, pt cued for core activation and increased LE ROM Neuromuscular Re-education: -Thoracic extensions, 1 set of 5 reps, 5 second holds, pt cued for HEP revision and best chair to complete in Therapeutic Activity: -Lateral stepping in parallel bars, 4 laps, BTB at knees, pt cued for decreased UE support -Sit to stands, weighted ball and BTB around knees, 2 set of 8 reps, pt cued for core activation, alternating hands and breathing -Speed step ups, 1 set 10 reps, 8 inch step, pt cued for upright posture, and decreased UE support  04/10/2024 Progress note: , 5TSTS, modified Oswestry, HEP  review and education  Therapeutic Exercise: -Lumbar ball roll outs, 1 set of 8 reps, pt cued for pain free ROM  -Shoulder extensions, RTB at chest level, 1 set of 10 reps, pt cued for eccentric control Neuromuscular Re-education: -Core ball push downs, 1 set of 5, 5 second holds, fwd and bilateral, red ball on stool -Paloff press, RTB at chest, 1 set of 10 reps bilaterally, pt cued for increased core control Therapeutic Activity: -Sit to stands, holding cone and weighted ball, 2 set of 10 reps, pt cued for core activation, alternating hands and breathing   04/04/24: Nustep L3 resistance x 5' UE/LE Sit to stand with elevated height 10x Minisquat front of chair cueing for mechanics Forward flexed over counter donkey kicks 10x 3 holds Abduction 10x  Supine: Hamstring stretch 3x30 hands behind knee                                 PATIENT EDUCATION:  Education details: Pt was educated on findings of PT evaluation, prognosis, frequency of therapy visits and rationale, attendance policy, and HEP if given.   Person educated: Patient Education method: Explanation, Verbal cues, and Handouts Education comprehension: verbalized understanding, verbal cues required, and needs further education  HOME EXERCISE PROGRAM: Access Code: [email or web address] URL: [email or web address] Date: 03/06/2024 Prepared by: Lang Ada  Exercises -- Supine Lower Trunk Rotation  - 1 x daily - 7 x weekly - 3 sets - 10 reps - Sit to Stand with Armchair  - 1 x daily - 7 x weekly - 3 sets - 10 reps  03/08/24: -Decompression 2-5 5x   Access Code: [email or web address] URL: [email or web address] Date: 03/13/2024 - Hooklying Single Knee to Chest Stretch  - 1 x daily - 7 x weekly - 3 reps - 20 sec hold - Supine Piriformis Stretch with Foot on Ground  - 1 x daily - 7 x weekly - 2 reps - 20 sec hold - Supine Figure 4 Piriformis Stretch  - 1 x daily - 7 x weekly - 2 reps - 20 sec hold - Supine Heel Slide  - 1  x daily - 7 x weekly - 10 reps  03/28/24:  Decompression with RTB  03/30/24: - Supine Hamstring Stretch  - 2 x daily - 7 x weekly - 1 sets - 3 reps - 30 hold  Access Code: [email or web address] URL: [email or web address] Date: 04/25/2024 Prepared by: Lang Ada  Exercises - Supine Bridge  - 1 x daily - 7 x weekly - 3 sets - 10 reps - Standing 3-way kick, with counter support  - 1 x daily - 7 x weekly - 3 sets - 10 reps - Side Stepping with Resistance at Thighs  - 1 x daily - 7 x weekly - 3 sets - 10 reps - Clamshell  - 1 x daily - 7 x weekly - 3 sets - 10 reps - Supine Lower Trunk Rotation  - 1 x daily - 7 x weekly - 3 sets - 10 reps - Hooklying Clamshell with Resistance  - 1 x daily - 7 x weekly - 3 sets - 10 reps - Forward Step Up with Counter Support  - 1 x daily - 7 x weekly - 3 sets - 10 reps  ASSESSMENT:  CLINICAL IMPRESSION: Patient continues to demonstrate low back pain and right hip pain, increased LE/core strength, stable gait quality and balance with AD. Patient also demonstrates fair endurance with aerobic based exercise during today's session on nustep and endurance activities today, a few sitting breaks needed. Patient able to continue dynamic balance and core activation exercises today with STS variation and lateral stepping, good performance with verbal cueing. Pts pain has not change with therapy to this point and pt states she feels she might be exercising too much. Patient to be discharged this session due to lack of response in low back and hip pain, and look forward to consult follow up for direction for POC for this pt.      Eval:  Patient is a 87 y.o. female who was seen today for physical therapy evaluation and treatment for M43.16 (ICD-10-CM) - Spondylolisthesis of lumbar region G89.29,M54.41 (ICD-10-CM) - Chronic bilateral low back pain with right-sided sciatica.  Patient demonstrates increased low back pain, decreased LE/core strength, abnormal gait pattern,  impaired functional mobility and balance. Patient also demonstrates difficulty with ambulation during today's session with decreased stride length and velocity noted. Patient also demonstrates increased low back pain with lumbar AROM with the worst symptom reproduction with extension and left lateral flexion. Patient requires education on the role of PT, prognosis, imaging results, and importance of HEP compliance. Patient would benefit from skilled physical therapy for decreased low back pain, increased endurance with ambulation, increased LE/core strength, and balance for improved gait quality, return to higher level of function with ADLs, and progress towards therapy goals.   OBJECTIVE IMPAIRMENTS: Abnormal gait, decreased balance, decreased endurance, decreased knowledge of use of DME, decreased mobility, difficulty walking, decreased ROM, decreased strength, and pain.   ACTIVITY LIMITATIONS: carrying, lifting, bending, sitting, standing, squatting, stairs, transfers, bed mobility, and bathing  PARTICIPATION LIMITATIONS: meal prep, cleaning, laundry, community activity, and yard work  PERSONAL FACTORS: Age, Fitness, Past/current experiences, and Time since onset of injury/illness/exacerbation are also affecting patient's functional outcome.   REHAB POTENTIAL: Fair chronic in nature  CLINICAL DECISION MAKING: Stable/uncomplicated  EVALUATION COMPLEXITY: Low   GOALS: Goals reviewed with patient? Yes  SHORT TERM GOALS: Target date: 03/27/24  Pt will be independent with HEP in order to demonstrate participation in Physical Therapy POC.  Baseline: Goal status: MET  2.  Pt will  report 3/10 pain with lumbar spine mobility in order to demonstrate improved pain with ADLs.  Baseline:  Goal status: MET  LONG TERM GOALS: Target date: 04/17/24  Pt will improve 5TSTS by at least 5 seconds in order to demonstrate improved functional strength to return to desired activities.  Baseline: see  objective.  Goal status: MET  2.  Pt will improve 2 MWT by at least 40 feet in order to demonstrate improved functional ambulatory capacity in community setting.  Baseline: see objective.  Goal status: MET  3.  Pt will improve Modified Oswestry score by at least 6 points in order to demonstrate improved pain with functional goals and outcomes. Baseline: see objective.  Goal status: NOT MET  4.  Pt will report 1/10 pain with mobility in order to demonstrate reduced pain with ADLs lasting greater than 30 minutes.  Baseline: see objective.  Goal status: NOT MET   PLAN:  PT FREQUENCY: 1-2x/week  PT DURATION: 6 weeks  PLANNED INTERVENTIONS: 97110-Therapeutic exercises, 97530- Therapeutic activity, [id number]- Neuromuscular re-education, 97535- Self Care, [id number]- Manual therapy, [phone]- Gait training, [phone]- Electrical stimulation (unattended), [phone]- Electrical stimulation (manual), Patient/Family education, Balance training, Stair training, Spinal mobilization, DME instructions, Cryotherapy, and Moist heat.  PLAN FOR NEXT SESSION: Discharged  Lang Ada, PT, DPT [facility] Office: [phone] 3:21 PM, 04/25/24

## 2024-05-15 ENCOUNTER — Encounter: Payer: Self-pay | Admitting: Neurosurgery

## 2024-05-15 ENCOUNTER — Ambulatory Visit: Admitting: Neurosurgery

## 2024-05-15 NOTE — Progress Notes (Unsigned)
 Referring Physician:  Shona Norleen PEDLAR, MD [address] [place],  [place] [address]  Primary Physician:  Shona Norleen PEDLAR, MD  History of Present Illness: 05/15/2024   03/27/2024 She has been doing physical therapy.  She feels that it is helping her.  01/26/2024 Allison Morris is here today with a chief complaint of chronic back pain. She was referred by her son-in-law, Oneil Salt, for evaluation of her back pain.  She has experienced back pain for approximately 25 years, primarily originating in her hip, with variable intensity affecting her ability to walk. She is unable to walk long distances and standing for extended periods is problematic.  She has not engaged in physical therapy but has received injections in the past. Pain pills are ineffective, though Tylenol  provides temporary relief. She has tried various medications for bone density but experienced adverse effects, leading her to discontinue them.  She experiences pain in her buttocks and has had numbness in her foot. No numbness or tingling in her legs since discontinuing the shots. Despite these issues, she remains active, engaging in activities like yard work, although she no longer drives due to her foot numbness.  Her past medical history includes stage three kidney disease, which is a consideration in her treatment options.  Bowel/Bladder Dysfunction: none  Conservative measures:  Physical therapy:  has not participated in Multimodal medical therapy including regular antiinflammatories:  Blue Emu Topical ointment, Tylenol , Tramadol  Injections: 11/03/2023 right L4-L5 nerve block        8/14/205-also at [facility]-waiting on notes  Past Surgery: none  Allison Morris has no symptoms of cervical myelopathy.  The symptoms are causing a significant impact on the patient's life.   I have utilized the care everywhere function in epic to review the outside records available from external health systems.   Review  of Systems:  A 10 point review of systems is negative, except for the pertinent positives and negatives detailed in the HPI.  Past Medical History: Past Medical History:  Diagnosis Date   Aortic dilatation    a. echo 08/2017 showed EF 65-70%, grade 1 DD, mild AI, mildly dilated ascending aorta (43mm), mild MR, mild TR.    Aortic regurgitation    Arthritis    CKD (chronic kidney disease), stage III (HCC)    Essential hypertension    GERD (gastroesophageal reflux disease)    History of DVT (deep vein thrombosis)    Left leg 1997    Hyperlipemia    Hypertensive urgency 02/05/2022   Mild aortic insufficiency    Mild mitral regurgitation    Mild tricuspid insufficiency    Seasonal allergies     Past Surgical History: Past Surgical History:  Procedure Laterality Date   APPENDECTOMY     CARPAL TUNNEL RELEASE     IR RADIOLOGIST EVAL & MGMT  01/21/2021   IR SACROPLASTY BILATERAL  12/18/2020   MASS EXCISION Right 08/07/2019   Procedure: EXCISION CYST RIGHT INDEX AND RIGHT MIDDLE FINGERS WITH DEBRIDEMENT OF METACARPALPHALANGEAL JOINTS;  Surgeon: Murrell Kuba, MD;  Location: [facility];  Service: Orthopedics;  Laterality: Right;   TONSILLECTOMY      Allergies: Allergies as of 05/15/2024 - Review Complete 05/15/2024  Allergen Reaction Noted   Amlodipine  12/21/2017   Amoxicillin  Other (See Comments) 05/15/2024   Biaxin [clarithromycin]  07/31/2014   Doxycycline   07/31/2014   Estrogens  07/31/2014   Felodipine   12/23/2017   Hydralazine  Other (See Comments) 02/05/2022  Labetalol  12/21/2017   Levothyroxine  12/02/2023   Lisinopril Cough 04/24/2019   Losartan  04/24/2019   Macrodantin [nitrofurantoin]  07/31/2014   Statins  07/31/2014   Ceftin [cefuroxime] Palpitations and Other (See Comments)    Chlorthalidone   04/24/2019   Ciprofloxacin Rash    Sulfa antibiotics Itching     Medications:  Current Outpatient Medications:    acetaminophen  (TYLENOL ) 500 MG tablet,  Take 500 mg by mouth every 6 (six) hours as needed for mild pain or moderate pain. , Disp: , Rfl:    aspirin  EC 81 MG EC tablet, Take 1 tablet (81 mg total) by mouth daily., Disp: 100 tablet, Rfl: 1   Biotin 10000 MCG TABS, Take 10,000 mcg by mouth daily., Disp: , Rfl:    BYSTOLIC  2.5 MG tablet, Take 1 tablet (2.5 mg total) by mouth 2 (two) times daily. OK TO TAKE AN EXTRA TABLET AS NEEDED FOR BLOOD PRESSURE ABOVE 150, Disp: 180 tablet, Rfl: 2   Cholecalciferol (VITAMIN D3) 1000 units CAPS, Take 1,000 Units by mouth daily., Disp: , Rfl:    clobetasol cream (TEMOVATE) 0.05 %, Apply 1 application topically daily as needed (for irritation). , Disp: , Rfl:    clopidogrel  (PLAVIX ) 75 MG tablet, Take 1 tablet (75 mg total) by mouth daily., Disp: 30 tablet, Rfl: 2   colchicine  0.6 MG tablet, Take 0.6 mg by mouth daily., Disp: , Rfl:    cromolyn (OPTICROM) 4 % ophthalmic solution, Place 1 drop into both eyes 4 (four) times daily., Disp: , Rfl:    fexofenadine (ALLEGRA) 180 MG tablet, Take 180 mg by mouth daily., Disp: , Rfl:    Flax Oil-Fish Oil-Borage Oil (FISH-FLAX-BORAGE) CAPS, Take 1 capsule by mouth 2 (two) times daily., Disp: , Rfl:    pantoprazole  (PROTONIX ) 40 MG tablet, Take 40 mg by mouth daily. , Disp: , Rfl:    Polyethyl Glycol-Propyl Glycol (SYSTANE OP), Place 1 drop into both eyes daily as needed (dry eyes)., Disp: , Rfl:    trolamine salicylate (ASPERCREME) 10 % cream, Apply 1 application  topically as needed for muscle pain., Disp: , Rfl:    vitamin C (ASCORBIC ACID) 500 MG tablet, Take 500 mg by mouth 2 (two) times daily., Disp: , Rfl:   Social History: Social History   Tobacco Use   Smoking status: Never   Smokeless tobacco: Never  Vaping Use   Vaping status: Never Used  Substance Use Topics   Alcohol use: No    Alcohol/week: 0.0 standard drinks of alcohol   Drug use: No    Family Medical History: Family History  Problem Relation Age of Onset   Aneurysm Sister    Valvular  heart disease Sister    Aneurysm Paternal Grandfather    Heart attack Paternal Grandfather     Physical Examination: Vitals:   05/15/24 1441  BP: 138/78     General: Patient is in no apparent distress. Attention to examination is appropriate.  Neck:   Supple.  Full range of motion.  Respiratory: Patient is breathing without any difficulty.   NEUROLOGICAL:     Awake, alert, oriented to person, place, and time.  Speech is clear and fluent.   Cranial Nerves: Pupils equal round and reactive to light.  Facial tone is symmetric.  Facial sensation is symmetric. Shoulder shrug is symmetric. Tongue protrusion is midline.   Strength:   Side Iliopsoas Quads Hamstring PF DF EHL  R 5 5 5 5 5 5   L 5 5  5 5 5 5    Gait is abnormal - requires cane.     Medical Decision Making  Imaging: MRI lumbar spine on November 24, 2022 shows advanced multilevel lumbar spondylosis with grade 1-2 anterolisthesis of L4 and L5.  She has severe stenosis at L4-5 and mild to moderate stenosis at L3-4.  I have personally reviewed the images and agree with the above interpretation.  Assessment and Plan: Ms. Goggins is a pleasant 88 y.o. female with spondylolisthesis of L4 and L5 causing back pain with primarily right sided sciatica.  She has been having worsening symptoms for the past 25 years.  She will continue physical therapy.  I will refer her for another injection.  I will see her back after she finishes physical therapy.  At that time, we will assess whether she is improved.  If she is not improved, we will consider reimaging versus pain management.  She may not be a candidate for surgical intervention due to her medical issues as well as her osteoporosis.  I spent a total of 10 minutes in this patient's care today. This time was spent reviewing pertinent records including imaging studies, obtaining and confirming history, performing a directed evaluation, formulating and discussing my recommendations, and  documenting the visit within the medical record.   Thank you for involving me in the care of this patient.      Braedin Millhouse K. Clois MD, [facility]

## 2024-05-21 ENCOUNTER — Telehealth: Payer: Self-pay | Admitting: Pharmacy Technician

## 2024-05-21 NOTE — Telephone Encounter (Signed)
° °  Pharmacy Patient Advocate Encounter   Received notification from CoverMyMeds that prior authorization for BYSTOLIC  is required/requested.   Insurance verification completed.   The patient is insured through [facility].    per pa:

## 2024-06-11 ENCOUNTER — Telehealth: Payer: Self-pay

## 2024-06-11 NOTE — Telephone Encounter (Signed)
-----   Message from Reeves Daisy, MD sent at 05/15/2024  2:59 PM EST ----- Please call her to see how her leg pain is.  We are trying to decide whether she wants to go for R L4/5 decompression

## 2024-06-11 NOTE — Telephone Encounter (Signed)
 I spoke with the patient. She states her leg pain is not good, but she is unsure if she wants surgery and would like to discuss this with her daughter. She would also like an appointment with Dr Clois to further discuss details of the surgery. I offered her an appointment this week, but she requested to wait until February.

## 2024-07-12 ENCOUNTER — Ambulatory Visit: Admitting: Neurosurgery

## 2024-08-14 ENCOUNTER — Ambulatory Visit: Admitting: Neurosurgery
